# Patient Record
Sex: Female | Born: 1935
Health system: Southern US, Community
[De-identification: ages and names within clinical notes are randomized; demographics above are authoritative.]

## PROBLEM LIST (undated history)

## (undated) DIAGNOSIS — E079 Disorder of thyroid, unspecified: Secondary | ICD-10-CM

## (undated) HISTORY — PX: TOTAL HIP ARTHROPLASTY: SHX124

## (undated) HISTORY — PX: TONSILLECTOMY: SUR1361

## (undated) HISTORY — PX: ABDOMINAL HYSTERECTOMY: SHX81

---

## 2008-07-23 ENCOUNTER — Ambulatory Visit (HOSPITAL_COMMUNITY): Admission: RE | Admit: 2008-07-23 | Discharge: 2008-07-23 | Payer: Self-pay | Admitting: Internal Medicine

## 2008-07-24 ENCOUNTER — Encounter (INDEPENDENT_AMBULATORY_CARE_PROVIDER_SITE_OTHER): Payer: Self-pay | Admitting: *Deleted

## 2008-08-12 ENCOUNTER — Ambulatory Visit: Payer: Self-pay | Admitting: Gastroenterology

## 2008-08-12 DIAGNOSIS — R198 Other specified symptoms and signs involving the digestive system and abdomen: Secondary | ICD-10-CM | POA: Insufficient documentation

## 2008-08-12 DIAGNOSIS — K921 Melena: Secondary | ICD-10-CM | POA: Insufficient documentation

## 2008-08-12 DIAGNOSIS — K649 Unspecified hemorrhoids: Secondary | ICD-10-CM | POA: Insufficient documentation

## 2008-08-22 ENCOUNTER — Encounter: Payer: Self-pay | Admitting: Gastroenterology

## 2008-08-22 DIAGNOSIS — K625 Hemorrhage of anus and rectum: Secondary | ICD-10-CM | POA: Insufficient documentation

## 2008-09-04 ENCOUNTER — Encounter: Payer: Self-pay | Admitting: Gastroenterology

## 2008-09-04 ENCOUNTER — Telehealth (INDEPENDENT_AMBULATORY_CARE_PROVIDER_SITE_OTHER): Payer: Self-pay

## 2008-11-03 ENCOUNTER — Encounter (INDEPENDENT_AMBULATORY_CARE_PROVIDER_SITE_OTHER): Payer: Self-pay

## 2009-03-31 ENCOUNTER — Encounter: Payer: Self-pay | Admitting: Gastroenterology

## 2009-11-21 ENCOUNTER — Ambulatory Visit (HOSPITAL_COMMUNITY): Admission: RE | Admit: 2009-11-21 | Discharge: 2009-11-21 | Payer: Self-pay | Admitting: Internal Medicine

## 2010-01-26 ENCOUNTER — Ambulatory Visit: Payer: Self-pay | Admitting: Cardiology

## 2010-01-26 ENCOUNTER — Ambulatory Visit: Admission: RE | Admit: 2010-01-26 | Discharge: 2010-01-26 | Payer: Self-pay | Admitting: Orthopedic Surgery

## 2010-01-27 ENCOUNTER — Encounter (INDEPENDENT_AMBULATORY_CARE_PROVIDER_SITE_OTHER): Payer: Self-pay | Admitting: Orthopedic Surgery

## 2010-01-27 ENCOUNTER — Ambulatory Visit (HOSPITAL_COMMUNITY): Admission: RE | Admit: 2010-01-27 | Discharge: 2010-01-27 | Payer: Self-pay | Admitting: Orthopedic Surgery

## 2010-01-28 ENCOUNTER — Inpatient Hospital Stay (HOSPITAL_COMMUNITY): Admission: RE | Admit: 2010-01-28 | Discharge: 2010-02-01 | Payer: Medicare Other | Admitting: Orthopedic Surgery

## 2010-06-23 LAB — TYPE AND SCREEN: Antibody Screen: NEGATIVE

## 2010-06-23 LAB — BASIC METABOLIC PANEL
BUN: 21 mg/dL (ref 6–23)
CO2: 21 mEq/L (ref 19–32)
CO2: 25 mEq/L (ref 19–32)
Calcium: 7.8 mg/dL — ABNORMAL LOW (ref 8.4–10.5)
Chloride: 112 mEq/L (ref 96–112)
GFR calc Af Amer: 40 mL/min — ABNORMAL LOW (ref >60)
GFR calc Af Amer: 43 mL/min — ABNORMAL LOW (ref >60)
GFR calc non Af Amer: 36 mL/min — ABNORMAL LOW (ref >60)
GFR calc non Af Amer: 51 mL/min — ABNORMAL LOW (ref >60)
Glucose, Bld: 119 mg/dL — ABNORMAL HIGH (ref 70–99)
Potassium: 4.3 mEq/L (ref 3.5–5.1)
Potassium: 4.6 mEq/L (ref 3.5–5.1)
Sodium: 135 mEq/L (ref 135–145)
Sodium: 140 mEq/L (ref 135–145)

## 2010-06-23 LAB — CBC
HCT: 21.3 % — ABNORMAL LOW (ref 36.0–46.0)
HCT: 22.4 % — ABNORMAL LOW (ref 36.0–46.0)
HCT: 22.6 % — ABNORMAL LOW (ref 36.0–46.0)
HCT: 23.4 % — ABNORMAL LOW (ref 36.0–46.0)
Hemoglobin: 7.6 g/dL — ABNORMAL LOW (ref 12.0–15.0)
Hemoglobin: 7.8 g/dL — ABNORMAL LOW (ref 12.0–15.0)
Hemoglobin: 8.2 g/dL — ABNORMAL LOW (ref 12.0–15.0)
MCH: 33.3 pg (ref 26.0–34.0)
MCH: 33.7 pg (ref 26.0–34.0)
MCHC: 33.9 g/dL (ref 30.0–36.0)
MCHC: 34.1 g/dL (ref 30.0–36.0)
MCHC: 34.4 g/dL (ref 30.0–36.0)
MCV: 97.6 fL (ref 78.0–100.0)
MCV: 98.9 fL (ref 78.0–100.0)
Platelets: 180 10*3/uL (ref 150–400)
Platelets: 190 10*3/uL (ref 150–400)
RBC: 2.13 MIL/uL — ABNORMAL LOW (ref 3.87–5.11)
RBC: 2.26 MIL/uL — ABNORMAL LOW (ref 3.87–5.11)
RBC: 2.31 MIL/uL — ABNORMAL LOW (ref 3.87–5.11)
RDW: 14.5 % (ref 11.5–15.5)
RDW: 14.5 % (ref 11.5–15.5)
RDW: 14.6 % (ref 11.5–15.5)
WBC: 6.6 10*3/uL (ref 4.0–10.5)
WBC: 7 10*3/uL (ref 4.0–10.5)

## 2010-06-23 LAB — DIFFERENTIAL
Basophils Absolute: 0 10*3/uL (ref 0.0–0.1)
Basophils Relative: 0 % (ref 0–1)
Eosinophils Relative: 2 % (ref 0–5)
Monocytes Absolute: 0.7 10*3/uL (ref 0.1–1.0)
Neutro Abs: 5 10*3/uL (ref 1.7–7.7)

## 2010-06-24 LAB — SURGICAL PCR SCREEN
MRSA, PCR: NEGATIVE
Staphylococcus aureus: NEGATIVE

## 2010-06-24 LAB — CBC
HCT: 33.7 % — ABNORMAL LOW (ref 36.0–46.0)
Hemoglobin: 11.4 g/dL — ABNORMAL LOW (ref 12.0–15.0)
RDW: 14.2 % (ref 11.5–15.5)
WBC: 5.9 10*3/uL (ref 4.0–10.5)

## 2010-06-24 LAB — BASIC METABOLIC PANEL
Calcium: 9.8 mg/dL (ref 8.4–10.5)
GFR calc non Af Amer: 25 mL/min — ABNORMAL LOW (ref >60)
Potassium: 5.5 mEq/L — ABNORMAL HIGH (ref 3.5–5.1)
Sodium: 141 mEq/L (ref 135–145)

## 2010-06-24 LAB — PROTIME-INR: INR: 0.96 (ref 0.00–1.49)

## 2010-06-24 LAB — HEPATIC FUNCTION PANEL
ALT: 13 U/L (ref 0–35)
Bilirubin, Direct: 0.1 mg/dL (ref 0.0–0.3)
Indirect Bilirubin: 0.7 mg/dL (ref 0.3–0.9)
Total Protein: 7.3 g/dL (ref 6.0–8.3)

## 2010-06-24 LAB — URINALYSIS, ROUTINE W REFLEX MICROSCOPIC
Nitrite: NEGATIVE
Specific Gravity, Urine: 1.018 (ref 1.005–1.030)
pH: 5.5 (ref 5.0–8.0)

## 2010-06-24 LAB — DIFFERENTIAL
Basophils Absolute: 0 10*3/uL (ref 0.0–0.1)
Lymphocytes Relative: 19 % (ref 12–46)
Monocytes Absolute: 0.5 10*3/uL (ref 0.1–1.0)
Neutro Abs: 4.1 10*3/uL (ref 1.7–7.7)
Neutrophils Relative %: 70 % (ref 43–77)

## 2010-06-24 LAB — APTT: aPTT: 29 seconds (ref 24–37)

## 2010-07-07 ENCOUNTER — Other Ambulatory Visit (HOSPITAL_COMMUNITY): Payer: Self-pay | Admitting: Internal Medicine

## 2010-07-07 ENCOUNTER — Ambulatory Visit (HOSPITAL_COMMUNITY)
Admission: RE | Admit: 2010-07-07 | Discharge: 2010-07-07 | Disposition: A | Discharge status: Home / Self Care | Payer: Medicare Other | Source: Ambulatory Visit | Attending: Internal Medicine | Admitting: Internal Medicine

## 2010-07-07 DIAGNOSIS — R52 Pain, unspecified: Secondary | ICD-10-CM

## 2010-07-07 DIAGNOSIS — M546 Pain in thoracic spine: Secondary | ICD-10-CM | POA: Insufficient documentation

## 2010-11-19 ENCOUNTER — Emergency Department (HOSPITAL_COMMUNITY): Payer: Medicare Other

## 2010-11-19 ENCOUNTER — Encounter: Payer: Self-pay | Admitting: *Deleted

## 2010-11-19 ENCOUNTER — Emergency Department (HOSPITAL_COMMUNITY)
Admission: EM | Admit: 2010-11-19 | Discharge: 2010-11-19 | Disposition: A | Discharge status: Home / Self Care | Payer: Medicare Other | Attending: Emergency Medicine | Admitting: Emergency Medicine

## 2010-11-19 DIAGNOSIS — S0181XA Laceration without foreign body of other part of head, initial encounter: Secondary | ICD-10-CM

## 2010-11-19 DIAGNOSIS — R296 Repeated falls: Secondary | ICD-10-CM | POA: Insufficient documentation

## 2010-11-19 DIAGNOSIS — S01501A Unspecified open wound of lip, initial encounter: Secondary | ICD-10-CM | POA: Insufficient documentation

## 2010-11-19 DIAGNOSIS — S1093XA Contusion of unspecified part of neck, initial encounter: Secondary | ICD-10-CM | POA: Insufficient documentation

## 2010-11-19 DIAGNOSIS — W010XXA Fall on same level from slipping, tripping and stumbling without subsequent striking against object, initial encounter: Secondary | ICD-10-CM

## 2010-11-19 DIAGNOSIS — Y92009 Unspecified place in unspecified non-institutional (private) residence as the place of occurrence of the external cause: Secondary | ICD-10-CM | POA: Insufficient documentation

## 2010-11-19 DIAGNOSIS — S022XXA Fracture of nasal bones, initial encounter for closed fracture: Secondary | ICD-10-CM

## 2010-11-19 DIAGNOSIS — IMO0002 Reserved for concepts with insufficient information to code with codable children: Secondary | ICD-10-CM | POA: Insufficient documentation

## 2010-11-19 DIAGNOSIS — S0083XA Contusion of other part of head, initial encounter: Secondary | ICD-10-CM

## 2010-11-19 DIAGNOSIS — S60229A Contusion of unspecified hand, initial encounter: Secondary | ICD-10-CM

## 2010-11-19 DIAGNOSIS — S6000XA Contusion of unspecified finger without damage to nail, initial encounter: Secondary | ICD-10-CM | POA: Insufficient documentation

## 2010-11-19 DIAGNOSIS — S0003XA Contusion of scalp, initial encounter: Secondary | ICD-10-CM | POA: Insufficient documentation

## 2010-11-19 DIAGNOSIS — S0081XA Abrasion of other part of head, initial encounter: Secondary | ICD-10-CM

## 2010-11-19 HISTORY — DX: Disorder of thyroid, unspecified: E07.9

## 2010-11-19 MED ORDER — HYDROCODONE-ACETAMINOPHEN 5-325 MG PO TABS: 1.0000 | ORAL_TABLET | ORAL | Status: AC | PRN

## 2010-11-19 MED ORDER — ACETAMINOPHEN 325 MG PO TABS
650.0000 mg | ORAL_TABLET | Freq: Once | ORAL | Status: AC
  Administered 2010-11-19: 650 mg via ORAL
  Filled 2010-11-19: qty 2

## 2010-11-19 MED ORDER — IBUPROFEN 400 MG PO TABS: 400.0000 mg | ORAL_TABLET | Freq: Three times a day (TID) | ORAL | Status: AC | PRN

## 2010-11-19 NOTE — ED Notes (Signed)
Bruising noted to face, nose, top of head, and left hand.

## 2010-11-19 NOTE — ED Notes (Signed)
Family at bedside. 

## 2010-11-19 NOTE — ED Notes (Signed)
Pt states that she lost her balance while looking behind her this am and fell in the bathroom. Pt states that she hit her head on the tile floor. Denies loss of consciousness. Pt has laceration to her nose, and just above upper lip. Pt also has abrasions to her left elbow left side of head with hematoma and  left little finger. Pt also c/o pain in her head left little finger, nose and bilateral knees. Small amt bright red blood noted from rt nostril.

## 2010-11-19 NOTE — ED Provider Notes (Signed)
History    Scribed for Felisa Bonier, MD, the patient was seen in room APA17/APA17 . This chart was scribed by Desma Paganini. This patient's care was started at 10:52 AM .   CSN: [id number] Arrival date & time: 11/19/2010 10:42 AM  Chief Complaint  Patient presents with  . Fall   HPI Kimberly Velazquez is a 77 y.o. female who presents to the Emergency Department complaining of fall just prior to arrival. She describes that she was at home in the bathroom when she felt that there was some toilet paper stuck to her behind. When she attempted to lean over and view herself in the mirror to remove the paper, she experienced a mechanical slip and fall forward on to her face on the linoleum floor. She was able to get herself to a phone and called her son to help her get to the ER. She has abrasions over the left side of her forehead, nose and upper lip. She denies LOC, current HA, nausea, vomiting, and has no new neck pain or back pain.   PAST MEDICAL HISTORY:  Past Medical History  Diagnosis Date  . Thyroid disease      PAST SURGICAL HISTORY:  Past Surgical History  Procedure Date  . Total hip arthroplasty     left  . Abdominal hysterectomy   . Tonsillectomy      MEDICATIONS:  Previous Medications   No medications on file     ALLERGIES:  Allergies as of 11/19/2010 - Review Complete 11/19/2010  Allergen Reaction Noted  . Keflex  11/19/2010  . Penicillins  11/19/2010     FAMILY HISTORY:  History reviewed. No pertinent family history.   SOCIAL HISTORY: History   Social History  . Marital Status: Widowed    Spouse Name: N/A    Number of Children: N/A  . Years of Education: N/A   Social History Main Topics  . Smoking status: Never Smoker   . Smokeless tobacco: None  . Alcohol Use: No  . Drug Use: No  . Sexually Active:    Other Topics Concern  . None   Social History Narrative  . None     Review of Systems 10 Systems reviewed and are negative for acute change  except as noted in the HPI.  Physical Exam  BP 148/66  Pulse 80  Temp(Src) 97.9 F (36.6 C) (Oral)  Resp 20  Ht 5\' 1"  (1.549 m)  Wt 116 lb (52.617 kg)  BMI 21.92 kg/m2  SpO2 99%  Physical Exam  Constitutional: She is oriented to person, place, and time. She appears well-developed and well-nourished. No distress.  HENT:  Head: Head is with contusion (Scalp hematoma at the left parietal / occipital scalp with some swelling).  Right Ear: Tympanic membrane normal. No hemotympanum.  Left Ear: Tympanic membrane normal. No hemotympanum.  Nose: Sinus tenderness (Small amount of blood in left nare without active bleeding and no nasal septal hematoma. Some swelling of the bridge of the nose without ecchymosis) present.  Eyes: Pupils are equal, round, and reactive to light.  Neck: Normal range of motion. Neck supple. No tracheal deviation present.  Cardiovascular: Normal rate, regular rhythm and normal heart sounds.   Pulmonary/Chest: Effort normal and breath sounds normal. No respiratory distress.  Abdominal: Soft. Bowel sounds are normal. She exhibits no distension. There is no tenderness.  Musculoskeletal: Normal range of motion. She exhibits no edema and no tenderness.       Contusion and abrasion radial  aspect of left elbow, left little finger there is welling and ecchymosis about the proximal interphalangeal joint, bruising on dorsum of righth and over metacarpal phalangaeal of the fourth and fifth finger  Pelvis is stable without tenderness to palpation  Neurological: She is alert and oriented to person, place, and time. Coordination normal.  Skin: Skin is warm and dry.  Psychiatric: She has a normal mood and affect. Her behavior is normal.    ED Course  Procedures  OTHER DATA REVIEWED: Nursing notes, vital signs, and past medical records reviewed.    DIAGNOSTIC STUDIES: Oxygen Saturation is 99% on room air, normal by my interpretation.     LABS / RADIOLOGY: CT scan of the  head and maxillofacial structures was reviewed by me as well as by the reading radiologist, and demonstrates no acute intracranial injury, cranial injury, that does show bilateral minimally displaced nasal bone fractures. X-rays of the bilateral hands were obtained due to the presence of tenderness and bruising, and these radiographs likewise showed no acute fracture    ED COURSE / COORDINATION OF CARE: 11:40. Completed initial assessment. Placed orders for laceration repair and pain control.    MDM: Differential Diagnosis: closed head injury, facial fracture, contusion, laceration, abrasion, hand fractures.   IMPRESSION: Facial contusions, facial abrasions, small facial laceration to the upper lip, minor head injury, bilateral nasal bone fractures, contusions of the upper extremities.    PLAN:  Home Nonsteroidals and Narcotic pain medication The patient is to return the emergency department if there is any worsening of symptoms. I have reviewed the discharge instructions with the patient and her family and they state their understanding of and agreement with the plan of care.   CONDITION ON DISCHARGE: Stable   MEDICATIONS GIVEN IN THE E.D. Medications - No data to display   DISCHARGE MEDICATIONS: New Prescriptions   No medications on file    Scribe Attestation I personally performed the services described in this documentation, which was scribed in my presence. The recorded information has been reviewed and considered.    Felisa Bonier, MD 11/19/10 [phone]

## 2011-10-22 IMAGING — CR DG HAND COMPLETE 3+V*R*
3 series · 3 of 3 positions shown · non-contrast
Comparison: None.

CLINICAL DATA: Swelling, pain, bruising

RIGHT HAND - COMPLETE 3+ VIEW

[view not recorded (1 of 3)]
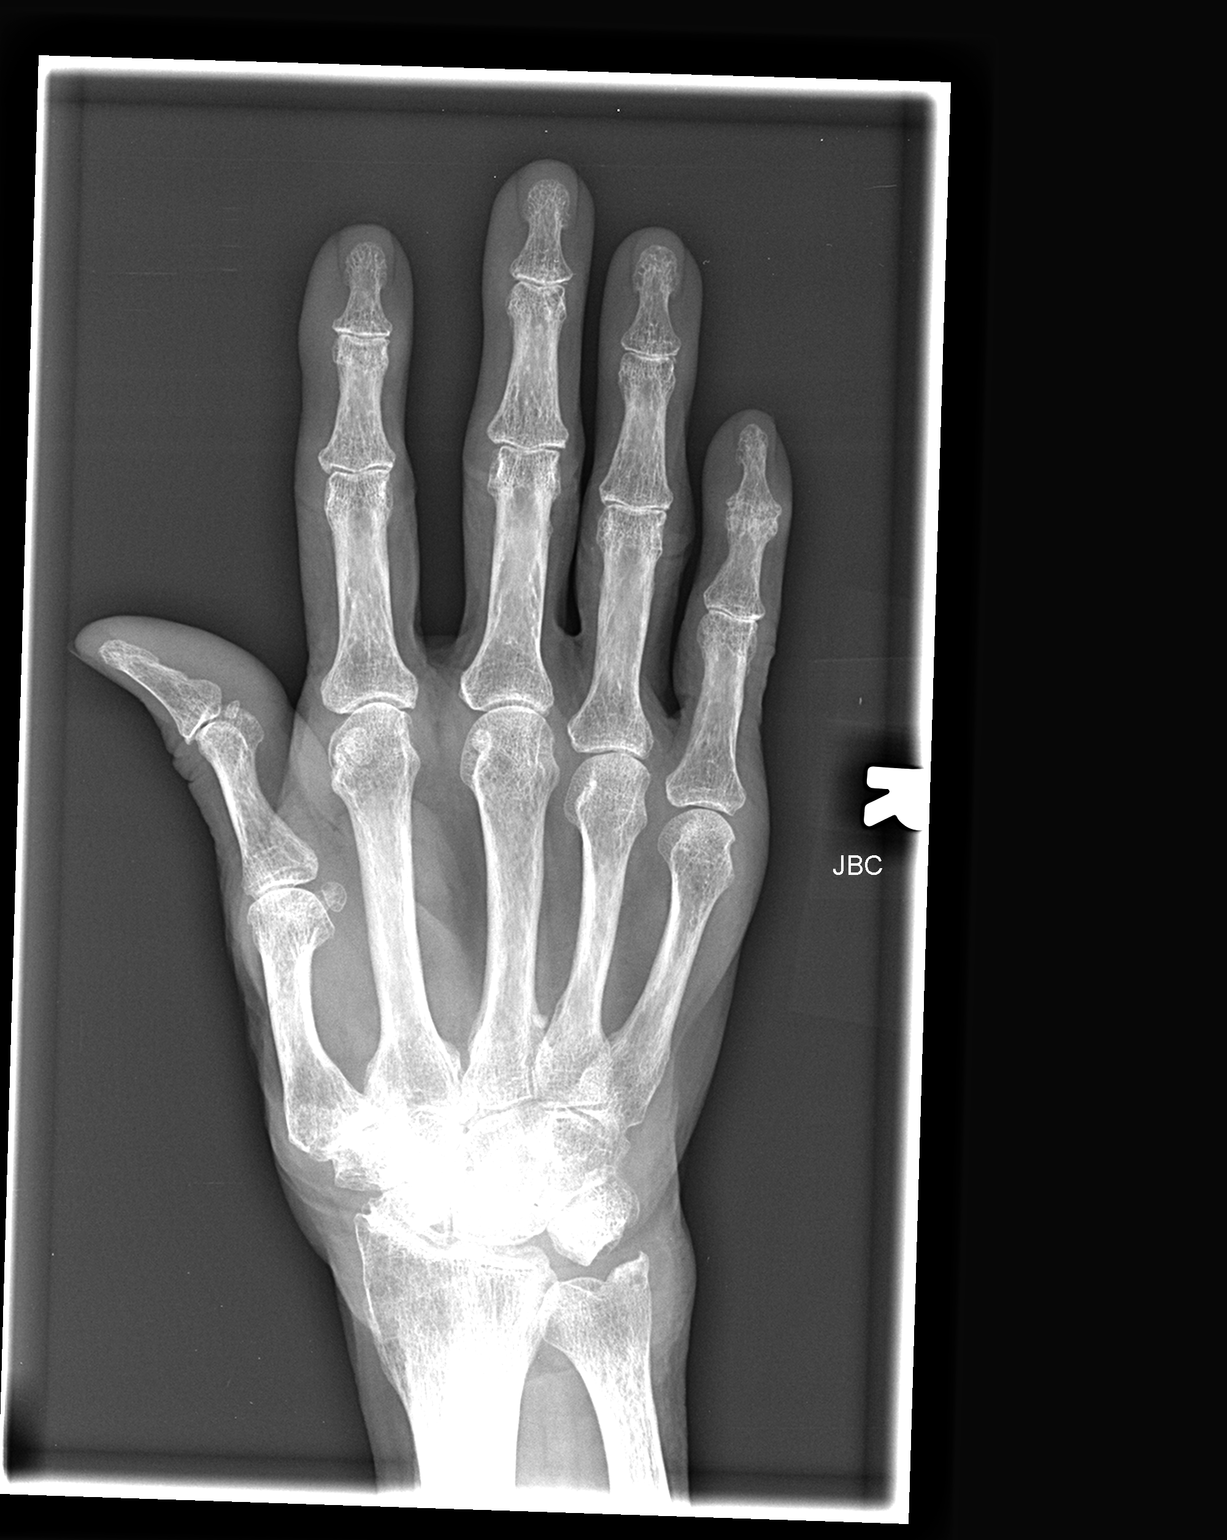

[view not recorded (2 of 3)]
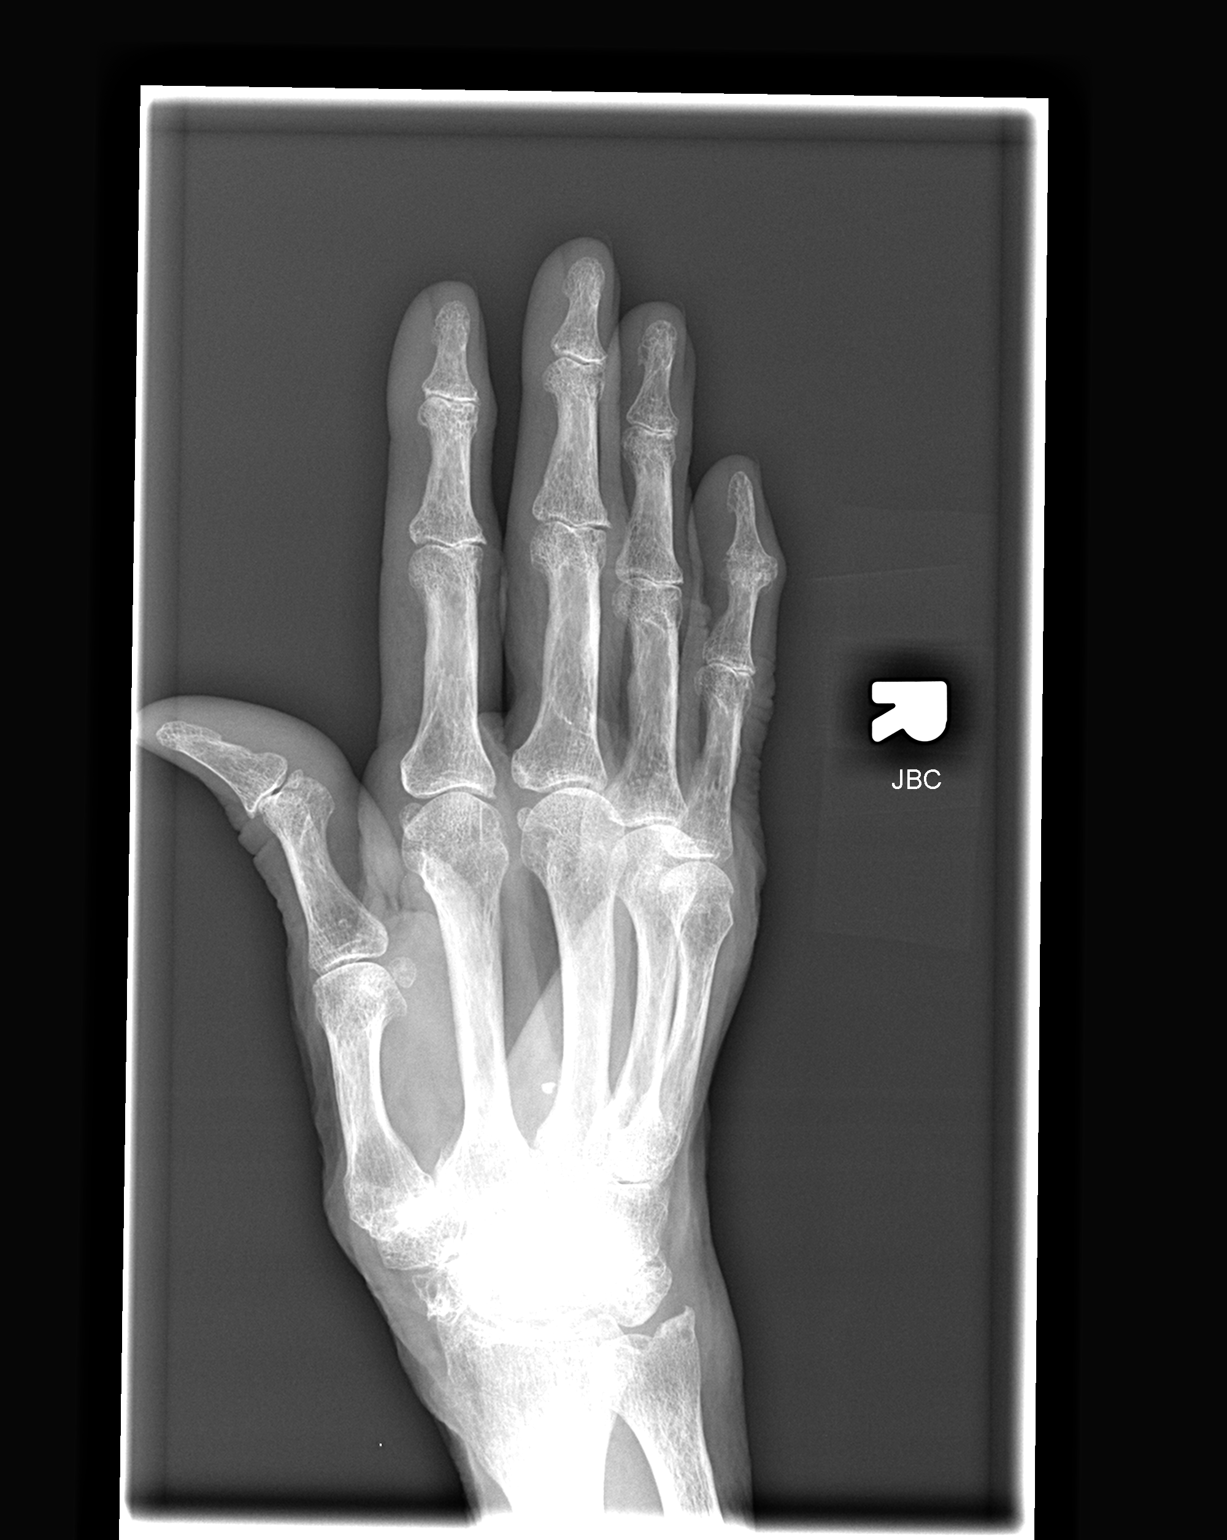

[view not recorded (3 of 3)]
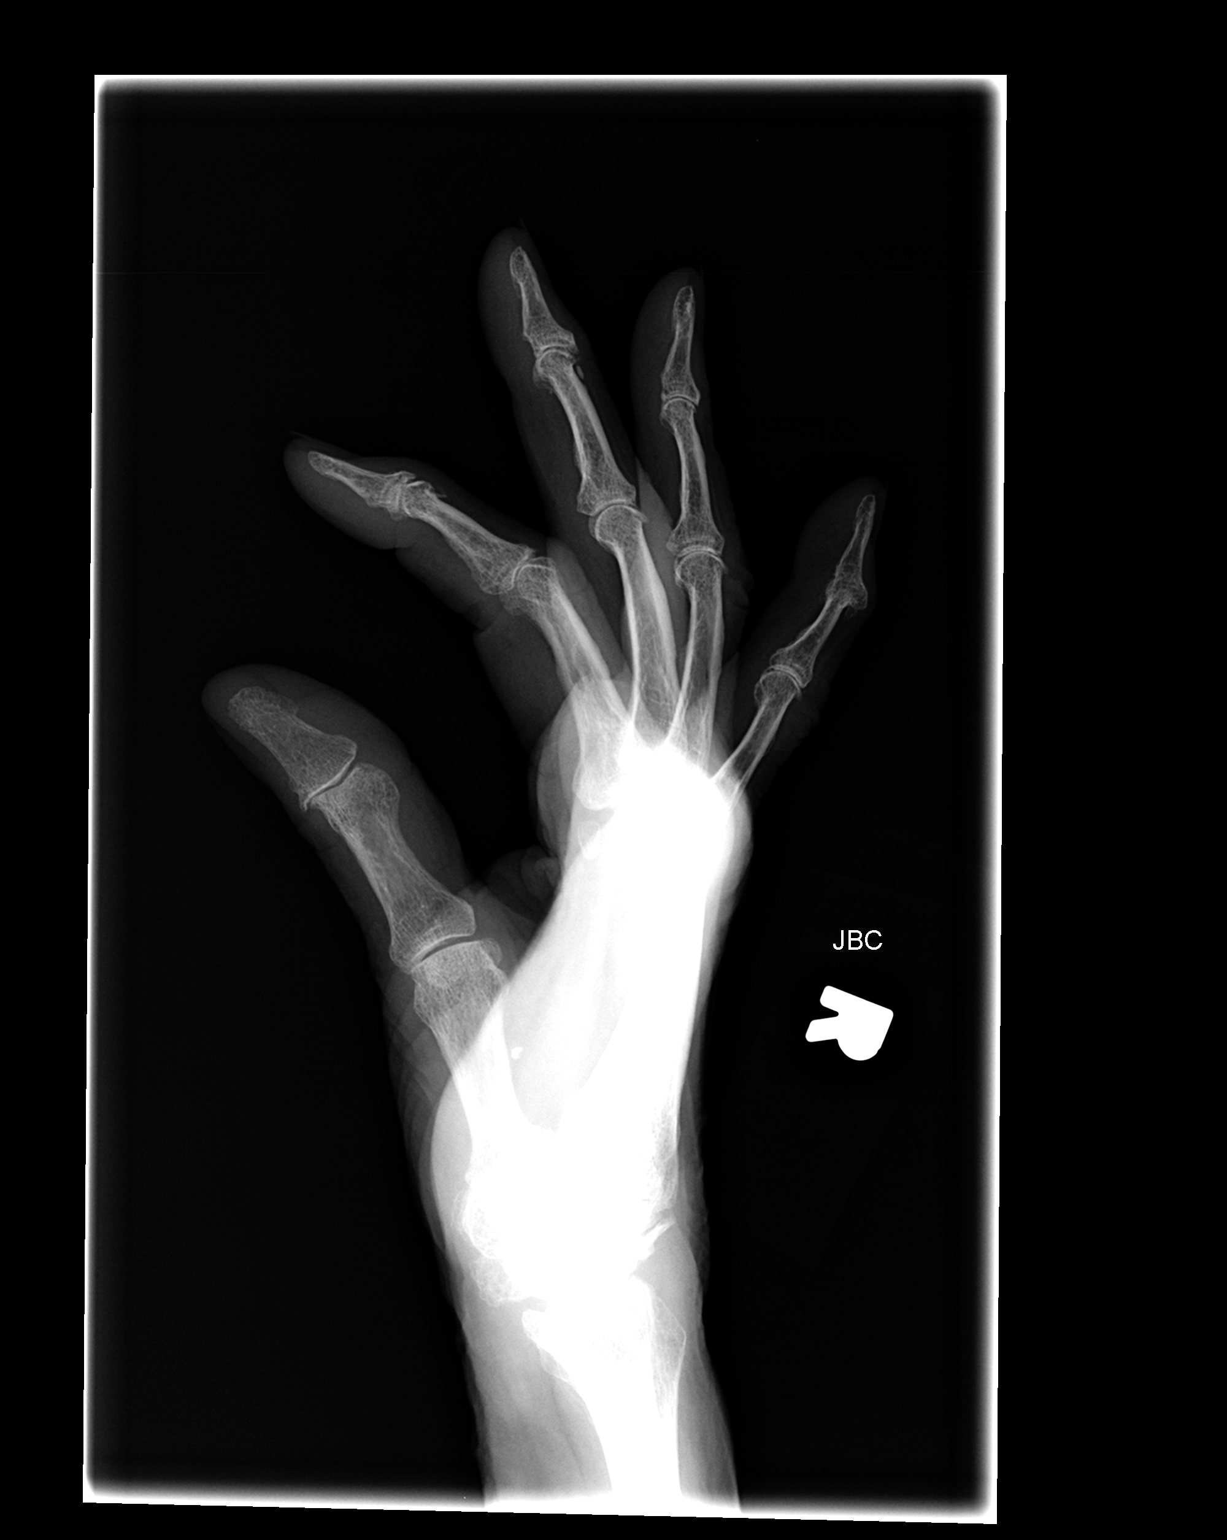

[3 of 3 positions shown; findings below may reference images not displayed]

FINDINGS: Diffuse degenerative changes of the carpal bones and the
radiocarpal joint.  Bones are osteopenic.  Diffuse osteoarthritis
of the PIP and DIP joints also.  No acute fracture or malalignment.
IMPRESSION: Diffuse degenerative arthritis of the wrist and hand.

No acute osseous finding or fracture.

## 2014-07-25 ENCOUNTER — Ambulatory Visit: Payer: Self-pay | Admitting: Cardiovascular Disease

## 2014-07-25 ENCOUNTER — Ambulatory Visit (INDEPENDENT_AMBULATORY_CARE_PROVIDER_SITE_OTHER): Payer: Medicare HMO | Admitting: Cardiovascular Disease

## 2014-07-25 ENCOUNTER — Encounter: Payer: Self-pay | Admitting: Cardiovascular Disease

## 2014-07-25 VITALS — BP 122/68 | HR 74 | Ht 61.0 in | Wt 131.0 lb

## 2014-07-25 DIAGNOSIS — R002 Palpitations: Secondary | ICD-10-CM | POA: Diagnosis not present

## 2014-07-25 DIAGNOSIS — Z136 Encounter for screening for cardiovascular disorders: Secondary | ICD-10-CM | POA: Diagnosis not present

## 2014-07-25 DIAGNOSIS — K625 Hemorrhage of anus and rectum: Secondary | ICD-10-CM

## 2014-07-25 NOTE — Progress Notes (Signed)
Patient ID: Kimberly Velazquez, female   DOB: 19-Oct-1919, 81 y.o.   MRN: [id number]       CARDIOLOGY CONSULT NOTE  Patient ID: Kimberly Velazquez MRN: [id number] DOB/AGE: November 02, 1919 81 y.o.  Admit date: (Not on file) Primary Physician Catalina Pizza, MD  Reason for Consultation: palpitations.  HPI: The patient is a 81 year old woman who has been referred by Dr. Margo Aye for palpitations. She is Dr. Scharlene Gloss wife's grandmother, and she is accompanied by her son, Dr. Scharlene Gloss father-in-law, [name].  For the past 4-6 weeks, she has been experiencing weak spells associated with palpitations. She notices them more frequently when she is standing or walking around but never while sitting. In fact, sitting down alleviates her symptoms. She denies chest pain, jaw pain, and neck pain related to these episodes. She does have a separate neck pain when raising her head.  She denies shortness of breath and syncope. She does have episodes of lower back pain and midthoracic back pain which sometimes precede these episodes of palpitations. She denies syncope. She has a history of bilateral venous varicosities and underwent vein stripping in her left leg approximately 15 years ago. She also has nocturia about every 2 hours.  ECG performed in the office today demonstrates normal sinus rhythm with an isolated PVC and rSR' pattern in leads V1 and V2. There are no ischemic ST segment or T-wave abnormalities.  Labs from 06/06/14 were personally reviewed and demonstrated BUN 53, creatinine 1.59, potassium 4.6, hemoglobin 12.7, total cholesterol 273, LDL 156, HDL 97, triglycerides 101, TSH 1.4, HbA1c 5.8%.  Soc: She is Dr. Scharlene Gloss wife's grandmother.     Allergies  Allergen Reactions  . Cephalexin Other (See Comments)    Pt. Doesn't remember.   . Penicillins Other (See Comments)    Pt. Cant remember.     Current Outpatient Prescriptions  Medication Sig Dispense Refill  . Acetaminophen (TYLENOL EXTRA STRENGTH PO) Take by  mouth 2 (two) times daily with breakfast and lunch.    Marland Kitchen aspirin 81 MG tablet Take 81 mg by mouth daily.    . calcium carbonate (OS-CAL) 1250 MG chewable tablet Chew 1 tablet by mouth 2 (two) times daily.     . Calcium-Vitamin D-Vitamin K (VIACTIV PO) Take by mouth 2 (two) times daily.    . Ginkgo Biloba 120 MG CAPS Take 120 mg by mouth daily with breakfast.    . levothyroxine (SYNTHROID, LEVOTHROID) 75 MCG tablet Take 75 mcg by mouth daily.      . Multiple Vitamins-Minerals (ONE-A-DAY WOMENS 50 PLUS PO) Take 1 tablet by mouth daily.       No current facility-administered medications for this visit.    Past Medical History  Diagnosis Date  . Thyroid disease     Past Surgical History  Procedure Laterality Date  . Total hip arthroplasty      left  . Abdominal hysterectomy    . Tonsillectomy      History   Social History  . Marital Status: Widowed    Spouse Name: N/A  . Number of Children: N/A  . Years of Education: N/A   Occupational History  . Not on file.   Social History Main Topics  . Smoking status: Never Smoker   . Smokeless tobacco: Not on file  . Alcohol Use: No  . Drug Use: No  . Sexual Activity: Not on file   Other Topics Concern  . Not on file   Social History Narrative  No family history of premature CAD in 1st degree relatives.  Prior to Admission medications   Medication Sig Start Date End Date Taking? Authorizing Provider  Acetaminophen (TYLENOL EXTRA STRENGTH PO) Take by mouth 2 (two) times daily with breakfast and lunch.   Yes Historical Provider, MD  aspirin 81 MG tablet Take 81 mg by mouth daily.   Yes Historical Provider, MD  calcium carbonate (OS-CAL) 1250 MG chewable tablet Chew 1 tablet by mouth 2 (two) times daily.    Yes Historical Provider, MD  Calcium-Vitamin D-Vitamin K (VIACTIV PO) Take by mouth 2 (two) times daily.   Yes Historical Provider, MD  Ginkgo Biloba 120 MG CAPS Take 120 mg by mouth daily with breakfast.   Yes Historical  Provider, MD  levothyroxine (SYNTHROID, LEVOTHROID) 75 MCG tablet Take 75 mcg by mouth daily.     Yes Historical Provider, MD  Multiple Vitamins-Minerals (ONE-A-DAY WOMENS 50 PLUS PO) Take 1 tablet by mouth daily.     Yes Historical Provider, MD     Review of systems complete and found to be negative unless listed above in HPI     Physical exam Blood pressure 122/68, pulse 74, height 5\' 1"  (1.549 m), weight 131 lb (59.421 kg). General: NAD Neck: No JVD, no thyromegaly or thyroid nodule.  Lungs: Clear to auscultation bilaterally with normal respiratory effort. CV: Nondisplaced PMI. Regular rate and rhythm, normal S1/S2, no S3/S4, no murmur.  No peripheral edema.  No carotid bruit.  Normal pedal pulses.  Abdomen: Soft, nontender, no hepatosplenomegaly, no distention.  Skin: Intact without lesions or rashes.  Neurologic: Alert and oriented x 3.  Psych: Normal affect. Extremities: No clubbing or cyanosis.  HEENT: Normal.   ECG: Most recent ECG reviewed.  Labs:   Lab Results  Component Value Date   WBC 6.6 02/01/2010   HGB 7.1* 02/01/2010   HCT 21.0* 02/01/2010   MCV 98.9 02/01/2010   PLT 180 02/01/2010   No results for input(s): NA, K, CL, CO2, BUN, CREATININE, CALCIUM, PROT, BILITOT, ALKPHOS, ALT, AST, GLUCOSE in the last 168 hours.  Invalid input(s): LABALBU No results found for: CKTOTAL, CKMB, CKMBINDEX, TROPONINI No results found for: CHOL No results found for: HDL No results found for: LDLCALC No results found for: TRIG No results found for: CHOLHDL No results found for: LDLDIRECT       Studies: No results found.  ASSESSMENT AND PLAN:  1. Palpitations: These appear to be posturally related to some degree. She has an isolated PVC on ECG. I will obtain an echocardiogram to evaluate for structural heart disease, and a one-week event monitor to evaluate for tachyarrhythmias. She and her son are in agreement with this plan.  Dispo: f/u 1 month.   Signed: Prentice DockerSuresh  Jinny Sweetland, M.D., F.A.C.C.  07/25/2014, 11:26 AM

## 2014-07-25 NOTE — Patient Instructions (Signed)
Your physician recommends that you schedule a follow-up appointment in: 1 month  Your physician recommends that you continue on your current medications as directed. Please refer to the Current Medication list given to you today.  Your physician has requested that you have an echocardiogram. Echocardiography is a painless test that uses sound waves to create images of your heart. It provides your doctor with information about the size and shape of your heart and how well your heart's chambers and valves are working. This procedure takes approximately one hour. There are no restrictions for this procedure.  Your physician has recommended that you wear an event monitor. Event monitors are medical devices that record the heart's electrical activity. Doctors most often us these monitors to diagnose arrhythmias. Arrhythmias are problems with the speed or rhythm of the heartbeat. The monitor is a small, portable device. You can wear one while you do your normal daily activities. This is usually used to diagnose what is causing palpitations/syncope (passing out).  Thank you for choosing [facility]!

## 2014-08-05 ENCOUNTER — Other Ambulatory Visit (HOSPITAL_COMMUNITY): Payer: Medicare HMO

## 2014-08-06 ENCOUNTER — Telehealth: Payer: Self-pay | Admitting: *Deleted

## 2014-08-06 NOTE — Telephone Encounter (Signed)
EOS report placed on Dr. Purvis SheffieldKoneswaran desk

## 2014-08-08 ENCOUNTER — Ambulatory Visit (HOSPITAL_COMMUNITY)
Admission: RE | Admit: 2014-08-08 | Discharge: 2014-08-08 | Disposition: A | Discharge status: Home / Self Care | Payer: Medicare HMO | Source: Ambulatory Visit | Attending: Cardiovascular Disease | Admitting: Cardiovascular Disease

## 2014-08-08 DIAGNOSIS — R002 Palpitations: Secondary | ICD-10-CM

## 2014-08-08 NOTE — Progress Notes (Signed)
  Echocardiogram 2D Echocardiogram has been performed.  Stacey DrainWhite, Feleica Fulmore J 08/08/2014, 12:40 PM

## 2014-09-02 ENCOUNTER — Encounter: Payer: Self-pay | Admitting: Cardiovascular Disease

## 2014-09-02 ENCOUNTER — Ambulatory Visit (INDEPENDENT_AMBULATORY_CARE_PROVIDER_SITE_OTHER): Payer: Medicare HMO | Admitting: Cardiovascular Disease

## 2014-09-02 VITALS — BP 128/64 | HR 85 | Ht 61.0 in | Wt 132.0 lb

## 2014-09-02 DIAGNOSIS — R002 Palpitations: Secondary | ICD-10-CM

## 2014-09-02 MED ORDER — METOPROLOL TARTRATE 25 MG PO TABS: ORAL_TABLET | ORAL | Status: DC

## 2014-09-02 NOTE — Patient Instructions (Signed)
Your physician recommends that you schedule a follow-up appointment in: only as needed    Take Metoprolol 12.5 mg daily as needed for palpitations     Thank you for choosing [facility] !

## 2014-09-02 NOTE — Progress Notes (Signed)
Patient ID: Kimberly Velazquez, female   DOB: 1919/09/02, 81 y.o.   MRN: [id number]      SUBJECTIVE: Kimberly Velazquez presents for follow up after undergoing cardiovascular testing performed for the evaluation of palpitations. She is Dr. Scharlene Gloss wife's grandmother, and she is accompanied by her son, Dr. Scharlene Gloss father-in-law, Kimberly Velazquez.  Event monitoring demonstrated normal sinus rhythm and sinus tachycardia and isolated premature atrial contractions, with the fastest heart rate being 103 bpm. No symptoms were reported.  Echocardiogram demonstrated normal left ventricular systolic function, EF 55-60%, mild LVH, normal regional wall motion, grade 1 diastolic dysfunction, and no significant valvular regurgitation.  Both her son and the patient endorse that she did not have any significant "spells" while she wore the event monitor. She primarily experiences symptoms with activity and may last anywhere from one to 3 minutes.   Soc: She is Dr. Scharlene Gloss wife's grandmother.    Review of Systems: As per "subjective", otherwise negative.  Allergies  Allergen Reactions  . Cephalexin Other (See Comments)    Pt. Doesn't remember.   . Penicillins Other (See Comments)    Pt. Cant remember.     Current Outpatient Prescriptions  Medication Sig Dispense Refill  . Acetaminophen (TYLENOL EXTRA STRENGTH PO) Take by mouth 2 (two) times daily with breakfast and lunch.    Marland Kitchen aspirin 81 MG tablet Take 81 mg by mouth daily.    . calcium carbonate (OS-CAL) 1250 MG chewable tablet Chew 1 tablet by mouth 2 (two) times daily.     . Calcium-Vitamin D-Vitamin K (VIACTIV PO) Take by mouth 2 (two) times daily.    . Ginkgo Biloba 120 MG CAPS Take 120 mg by mouth daily with breakfast.    . levothyroxine (SYNTHROID, LEVOTHROID) 75 MCG tablet Take 75 mcg by mouth daily.      . Multiple Vitamins-Minerals (ONE-A-DAY WOMENS 50 PLUS PO) Take 1 tablet by mouth daily.       No current facility-administered medications for this visit.      Past Medical History  Diagnosis Date  . Thyroid disease     Past Surgical History  Procedure Laterality Date  . Total hip arthroplasty      left  . Abdominal hysterectomy    . Tonsillectomy      History   Social History  . Marital Status: Widowed    Spouse Name: N/A  . Number of Children: N/A  . Years of Education: N/A   Occupational History  . Not on file.   Social History Main Topics  . Smoking status: Never Smoker   . Smokeless tobacco: Not on file  . Alcohol Use: No  . Drug Use: No  . Sexual Activity: Not on file   Other Topics Concern  . Not on file   Social History Narrative     Filed Vitals:   09/02/14 1501  BP: 128/64  Pulse: 85  Height:  (1.549 m)  Weight: 132 lb (59.875 kg)  SpO2: 99%    PHYSICAL EXAM General: NAD Neck: No JVD, no thyromegaly or thyroid nodule.  Lungs: Clear to auscultation bilaterally with normal respiratory effort. CV: Nondisplaced PMI. Regular rate and rhythm with occasional premature contractions appreciated, normal S1/S2, no S3/S4, no murmur. No peripheral edema.   Abdomen: Soft, nontender, no distention.  Skin: Intact without lesions or rashes.  Neurologic: Alert and oriented.  Psych: Normal affect. Extremities: No clubbing or cyanosis.  HEENT: Normal.   ECG: Most recent ECG reviewed.  ASSESSMENT AND PLAN: 1. Palpitations: These appear to be both exertionally and posturally related to some degree. She previously had an isolated PVC on ECG and had an isolated PAC with event monitoring. Echocardiogram and event monitor results noted above. Will prescribe metoprolol 12.5 mg prn for palpitations which last several minutes and are bothersome. No additional cardiac testing is warranted.  Dispo: f/u 1 year.   Prentice DockerSuresh Felix Meras, M.D., F.A.C.C.

## 2014-11-03 ENCOUNTER — Ambulatory Visit (HOSPITAL_COMMUNITY)
Admission: RE | Admit: 2014-11-03 | Discharge: 2014-11-03 | Disposition: A | Discharge status: Home / Self Care | Payer: Medicare HMO | Source: Ambulatory Visit | Attending: Internal Medicine | Admitting: Internal Medicine

## 2014-11-03 ENCOUNTER — Other Ambulatory Visit (HOSPITAL_COMMUNITY): Payer: Self-pay | Admitting: Internal Medicine

## 2014-11-03 DIAGNOSIS — M533 Sacrococcygeal disorders, not elsewhere classified: Secondary | ICD-10-CM | POA: Diagnosis not present

## 2014-11-03 DIAGNOSIS — M25551 Pain in right hip: Secondary | ICD-10-CM | POA: Diagnosis not present

## 2014-11-03 DIAGNOSIS — M545 Low back pain: Secondary | ICD-10-CM | POA: Diagnosis present

## 2014-11-03 DIAGNOSIS — M5136 Other intervertebral disc degeneration, lumbar region: Secondary | ICD-10-CM | POA: Diagnosis not present

## 2014-11-03 DIAGNOSIS — W19XXXA Unspecified fall, initial encounter: Secondary | ICD-10-CM | POA: Insufficient documentation

## 2014-11-03 DIAGNOSIS — Z96642 Presence of left artificial hip joint: Secondary | ICD-10-CM | POA: Diagnosis not present

## 2014-11-08 ENCOUNTER — Observation Stay (HOSPITAL_COMMUNITY)
Admission: RE | Admit: 2014-11-08 | Discharge: 2014-11-10 | Disposition: A | Discharge status: Home / Self Care | Payer: Medicare HMO | Source: Ambulatory Visit | Attending: Internal Medicine | Admitting: Internal Medicine

## 2014-11-08 DIAGNOSIS — E039 Hypothyroidism, unspecified: Secondary | ICD-10-CM

## 2014-11-08 DIAGNOSIS — S8011XA Contusion of right lower leg, initial encounter: Secondary | ICD-10-CM | POA: Insufficient documentation

## 2014-11-08 DIAGNOSIS — Z880 Allergy status to penicillin: Secondary | ICD-10-CM | POA: Insufficient documentation

## 2014-11-08 DIAGNOSIS — S3992XA Unspecified injury of lower back, initial encounter: Secondary | ICD-10-CM | POA: Diagnosis not present

## 2014-11-08 DIAGNOSIS — W19XXXA Unspecified fall, initial encounter: Secondary | ICD-10-CM

## 2014-11-08 DIAGNOSIS — E038 Other specified hypothyroidism: Secondary | ICD-10-CM | POA: Diagnosis not present

## 2014-11-08 DIAGNOSIS — W1839XA Other fall on same level, initial encounter: Secondary | ICD-10-CM | POA: Insufficient documentation

## 2014-11-08 DIAGNOSIS — N182 Chronic kidney disease, stage 2 (mild): Secondary | ICD-10-CM | POA: Diagnosis not present

## 2014-11-08 DIAGNOSIS — S7002XA Contusion of left hip, initial encounter: Secondary | ICD-10-CM | POA: Diagnosis not present

## 2014-11-08 DIAGNOSIS — Y9389 Activity, other specified: Secondary | ICD-10-CM | POA: Diagnosis not present

## 2014-11-08 DIAGNOSIS — Z7982 Long term (current) use of aspirin: Secondary | ICD-10-CM | POA: Insufficient documentation

## 2014-11-08 DIAGNOSIS — M5442 Lumbago with sciatica, left side: Secondary | ICD-10-CM

## 2014-11-08 DIAGNOSIS — M5441 Lumbago with sciatica, right side: Secondary | ICD-10-CM | POA: Diagnosis not present

## 2014-11-08 DIAGNOSIS — Z96642 Presence of left artificial hip joint: Secondary | ICD-10-CM | POA: Diagnosis not present

## 2014-11-08 DIAGNOSIS — Z79899 Other long term (current) drug therapy: Secondary | ICD-10-CM | POA: Insufficient documentation

## 2014-11-08 DIAGNOSIS — W19XXXD Unspecified fall, subsequent encounter: Secondary | ICD-10-CM | POA: Diagnosis not present

## 2014-11-08 DIAGNOSIS — Y998 Other external cause status: Secondary | ICD-10-CM | POA: Diagnosis not present

## 2014-11-08 DIAGNOSIS — Y92002 Bathroom of unspecified non-institutional (private) residence single-family (private) house as the place of occurrence of the external cause: Secondary | ICD-10-CM | POA: Insufficient documentation

## 2014-11-08 DIAGNOSIS — M545 Low back pain, unspecified: Secondary | ICD-10-CM

## 2014-11-08 LAB — CBC
HEMATOCRIT: 37.9 % (ref 36.0–46.0)
Hemoglobin: 12.4 g/dL (ref 12.0–15.0)
MCH: 33.9 pg (ref 26.0–34.0)
MCHC: 32.7 g/dL (ref 30.0–36.0)
MCV: 103.6 fL — AB (ref 78.0–100.0)
Platelets: 401 10*3/uL — ABNORMAL HIGH (ref 150–400)
RBC: 3.66 MIL/uL — ABNORMAL LOW (ref 3.87–5.11)
RDW: 15 % (ref 11.5–15.5)
WBC: 12.6 10*3/uL — ABNORMAL HIGH (ref 4.0–10.5)

## 2014-11-08 LAB — BASIC METABOLIC PANEL
Anion gap: 13 (ref 5–15)
BUN: 53 mg/dL — ABNORMAL HIGH (ref 6–20)
CHLORIDE: 96 mmol/L — AB (ref 101–111)
CO2: 26 mmol/L (ref 22–32)
Calcium: 9.5 mg/dL (ref 8.9–10.3)
Creatinine, Ser: 1.47 mg/dL — ABNORMAL HIGH (ref 0.44–1.00)
GFR calc Af Amer: 34 mL/min — ABNORMAL LOW (ref >60)
GFR, EST NON AFRICAN AMERICAN: 30 mL/min — AB (ref >60)
GLUCOSE: 177 mg/dL — AB (ref 65–99)
POTASSIUM: 4.9 mmol/L (ref 3.5–5.1)
SODIUM: 135 mmol/L (ref 135–145)

## 2014-11-08 MED ORDER — ONDANSETRON HCL 4 MG PO TABS: 4.0000 mg | ORAL_TABLET | Freq: Four times a day (QID) | ORAL | Status: DC | PRN

## 2014-11-08 MED ORDER — DOCUSATE SODIUM 100 MG PO CAPS
100.0000 mg | ORAL_CAPSULE | Freq: Two times a day (BID) | ORAL | Status: DC
  Administered 2014-11-08 – 2014-11-10 (×4): 100 mg via ORAL
  Filled 2014-11-08 (×4): qty 1

## 2014-11-08 MED ORDER — MORPHINE SULFATE 2 MG/ML IJ SOLN: 0.5000 mg | INTRAMUSCULAR | Status: DC | PRN

## 2014-11-08 MED ORDER — HEPARIN SODIUM (PORCINE) 5000 UNIT/ML IJ SOLN
5000.0000 [IU] | Freq: Three times a day (TID) | INTRAMUSCULAR | Status: DC
  Administered 2014-11-08 – 2014-11-10 (×6): 5000 [IU] via SUBCUTANEOUS
  Filled 2014-11-08 (×6): qty 1

## 2014-11-08 MED ORDER — ASPIRIN EC 81 MG PO TBEC
81.0000 mg | DELAYED_RELEASE_TABLET | Freq: Every day | ORAL | Status: DC
  Administered 2014-11-09 – 2014-11-10 (×2): 81 mg via ORAL
  Filled 2014-11-08 (×2): qty 1

## 2014-11-08 MED ORDER — OXYCODONE HCL 5 MG PO TABS: 5.0000 mg | ORAL_TABLET | Freq: Four times a day (QID) | ORAL | Status: DC | PRN

## 2014-11-08 MED ORDER — LEVOTHYROXINE SODIUM 75 MCG PO TABS
75.0000 ug | ORAL_TABLET | Freq: Every day | ORAL | Status: DC
  Administered 2014-11-09 – 2014-11-10 (×2): 75 ug via ORAL
  Filled 2014-11-08 (×2): qty 1

## 2014-11-08 MED ORDER — ACETAMINOPHEN 500 MG PO TABS
500.0000 mg | ORAL_TABLET | Freq: Two times a day (BID) | ORAL | Status: DC
  Administered 2014-11-08 – 2014-11-10 (×4): 500 mg via ORAL
  Filled 2014-11-08 (×4): qty 1

## 2014-11-08 MED ORDER — ONDANSETRON HCL 4 MG/2ML IJ SOLN: 4.0000 mg | Freq: Four times a day (QID) | INTRAMUSCULAR | Status: DC | PRN

## 2014-11-08 MED ORDER — PREDNISONE 10 MG PO TABS
10.0000 mg | ORAL_TABLET | Freq: Every day | ORAL | Status: DC
  Administered 2014-11-09: 10 mg via ORAL
  Filled 2014-11-08: qty 1

## 2014-11-08 MED ORDER — ADULT MULTIVITAMIN W/MINERALS CH
1.0000 | ORAL_TABLET | Freq: Every day | ORAL | Status: DC
  Administered 2014-11-09 – 2014-11-10 (×2): 1 via ORAL
  Filled 2014-11-08 (×2): qty 1

## 2014-11-08 MED ORDER — SODIUM CHLORIDE 0.9 % IV SOLN
INTRAVENOUS | Status: DC
  Administered 2014-11-08 – 2014-11-10 (×3): via INTRAVENOUS

## 2014-11-08 MED ORDER — GABAPENTIN 100 MG PO CAPS
100.0000 mg | ORAL_CAPSULE | Freq: Two times a day (BID) | ORAL | Status: DC
  Administered 2014-11-08 – 2014-11-09 (×3): 100 mg via ORAL
  Filled 2014-11-08 (×3): qty 1

## 2014-11-08 NOTE — H&P (Signed)
[facility]          History and Physical    PCP:   Catalina Pizza, MD   EDP: None  Chief Complaint:  Low back and left hip pain  HPI: Patient is a 81 year old lady who is in remarkably good health with past medical history only significant for hypothyroidism who presents to the hospital today with the above-mentioned complaint. It appears that 2 weeks ago she had a fall in her bathroom and since then has been experiencing some low back and left hip pain. Her PCP sent her for x-rays of the lumbar spine, left hip as well as sacrum and coccyx  all which were negative for fracture. Initially was complaining of what sounded like a groin muscle strain and had initial improvement with local conservative care and Tylenol but about 5 days later started having more severe pain shooting down her left leg. She was started on a prednisone taper of which she has 1 day remaining, has also been using half a tablet of Vicodin as well as Neurontin. Despite this she still has significant pain and was referred to [place] today for management of her pain as well as to expedite her placement in short-term rehabilitation as she has been unable to perform her activities of daily living at home.  Allergies:   Allergies  Allergen Reactions  . Cephalexin Other (See Comments)    Pt. Doesn't remember.   . Penicillins Other (See Comments)    Pt. Cant remember.       Past Medical History  Diagnosis Date  . Thyroid disease     Past Surgical History  Procedure Laterality Date  . Total hip arthroplasty      left  . Abdominal hysterectomy    . Tonsillectomy      Prior to Admission medications   Medication Sig Start Date End Date Taking? Authorizing Provider  Acetaminophen (TYLENOL EXTRA STRENGTH PO) Take by mouth 2 (two) times daily with breakfast and lunch.    Historical Provider, MD  aspirin 81 MG tablet Take 81 mg by mouth daily.    Historical Provider, MD  calcium carbonate (OS-CAL)  1250 MG chewable tablet Chew 1 tablet by mouth 2 (two) times daily.     Historical Provider, MD  Calcium-Vitamin D-Vitamin K (VIACTIV PO) Take by mouth 2 (two) times daily.    Historical Provider, MD  Ginkgo Biloba 120 MG CAPS Take 120 mg by mouth daily with breakfast.    Historical Provider, MD  levothyroxine (SYNTHROID, LEVOTHROID) 75 MCG tablet Take 75 mcg by mouth daily.      Historical Provider, MD  metoprolol tartrate (LOPRESSOR) 25 MG tablet Take daily as needed for palpitations 09/02/14   Laqueta Linden, MD  Multiple Vitamins-Minerals (ONE-A-DAY WOMENS 50 PLUS PO) Take 1 tablet by mouth daily.      Historical Provider, MD    Social History:  reports that she has never smoked. She does not have any smokeless tobacco history on file. She reports that she does not drink alcohol or use illicit drugs.  Family History  Problem Relation Age of Onset  . Heart attack Mother   . Heart attack Father     Review of Systems:  Constitutional: Denies fever, chills, diaphoresis, appetite change and fatigue.  HEENT: Denies photophobia, eye pain, redness, hearing loss, ear pain, congestion, sore throat, rhinorrhea, sneezing, mouth sores, trouble swallowing, neck pain, neck stiffness and  tinnitus.   Respiratory: Denies SOB, DOE, cough, chest tightness,  and wheezing.   Cardiovascular: Denies chest pain, palpitations and leg swelling.  Gastrointestinal: Denies nausea, vomiting, abdominal pain, diarrhea, constipation, blood in stool and abdominal distention.  Genitourinary: Denies dysuria, urgency, frequency, hematuria, flank pain and difficulty urinating.  Endocrine: Denies: hot or cold intolerance, sweats, changes in hair or nails, polyuria, polydipsia. Musculoskeletal: Denies myalgias, back pain, joint swelling, arthralgias and gait problem.  Skin: Denies pallor, rash and wound.  Neurological: Denies dizziness, seizures, syncope, weakness, light-headedness, numbness and headaches.    Hematological: Denies adenopathy. Easy bruising, personal or family bleeding history  Psychiatric/Behavioral: Denies suicidal ideation, mood changes, confusion, nervousness, sleep disturbance and agitation   Physical Exam: Blood pressure 143/69, pulse 74, temperature 98.4 F (36.9 C), resp. rate 18, height  (1.549 m), weight 58.968 kg (130 lb), SpO2 95 %. General: Alert, awake, oriented 3 HEENT: Normocephalic, atraumatic, pupils equal round reactive to light, extraocular movements intact, somewhat dry mucous membranes. Neck: Supple, no JVD, no lymphadenopathy, no bruits, no goiter. Cardiovascular: Soft systolic ejection murmur best heard at the right upper sternal border, regular rate and rhythm, no rubs or gallops. Lungs: Clear to auscultation bilaterally. Abdomen: Soft, nontender, nondistended, positive bowel sounds, no masses or organomegaly noted, slight edematous area/bruising to her sacral area right above the beginning of her butt cheeks. No sign extremities: No clubbing, cyanosis or edema, positive pulses, small bruise over her right calf, large bruise over her left hip, prior scar from left hip replacement. Neurologic: Unable to fully assess given pain, moves all 4 spontaneously  Labs on Admission:  No results found for this or any previous visit (from the past 48 hour(s)).  Radiological Exams on Admission: No results found.  Assessment/Plan Principal Problem:   Low back pain Active Problems:   Hypothyroidism   Fall   CKD (chronic kidney disease) stage 2, GFR 60-89 ml/min   Low back pain -Following fall. -She does have significant bruising to her left hip, right calf as well as minor bruising to her sacral area. -We'll continue her prednisone taper which she has 1 day remaining, Neurontin, will give her low-dose oxycodone and morphine if needed, will schedule Tylenol twice a day to hopefully provide some sort of background pain relief. Need to be careful with dosing  of narcotic medications in this elderly lady. -We will obtain a PT evaluation was, will likely need short-term SNF.  Hypothyroidism -Continue home dose of Synthroid, per PCP notes TSH was checked about 3 weeks ago and was within range.  Chronic kidney disease stage II -Appears baseline creatinine is between 1.1 and 1.2, labs pending for today.  DVT prophylaxis -Subcutaneous heparin.  CODE STATUS -Full code.  Time Spent on Admission: 50 minutes  HERNANDEZ ACOSTA,ESTELA [facility] Pager: [phone] 11/08/2014, 2:18 PM

## 2014-11-09 ENCOUNTER — Encounter (HOSPITAL_COMMUNITY): Payer: Self-pay | Admitting: *Deleted

## 2014-11-09 DIAGNOSIS — W19XXXD Unspecified fall, subsequent encounter: Secondary | ICD-10-CM

## 2014-11-09 DIAGNOSIS — S3992XA Unspecified injury of lower back, initial encounter: Secondary | ICD-10-CM | POA: Diagnosis not present

## 2014-11-09 DIAGNOSIS — N182 Chronic kidney disease, stage 2 (mild): Secondary | ICD-10-CM

## 2014-11-09 DIAGNOSIS — M5441 Lumbago with sciatica, right side: Secondary | ICD-10-CM | POA: Diagnosis not present

## 2014-11-09 DIAGNOSIS — E038 Other specified hypothyroidism: Secondary | ICD-10-CM | POA: Diagnosis not present

## 2014-11-09 MED ORDER — TRAMADOL HCL 50 MG PO TABS: 50.0000 mg | ORAL_TABLET | Freq: Four times a day (QID) | ORAL | Status: DC | PRN

## 2014-11-09 MED ORDER — GABAPENTIN 300 MG PO CAPS
300.0000 mg | ORAL_CAPSULE | Freq: Two times a day (BID) | ORAL | Status: DC
  Administered 2014-11-09 – 2014-11-10 (×2): 300 mg via ORAL
  Filled 2014-11-09 (×2): qty 1

## 2014-11-09 MED ORDER — POLYETHYLENE GLYCOL 3350 17 G PO PACK
17.0000 g | PACK | Freq: Every day | ORAL | Status: DC
  Administered 2014-11-09 – 2014-11-10 (×2): 17 g via ORAL
  Filled 2014-11-09 (×2): qty 1

## 2014-11-09 NOTE — Progress Notes (Signed)
[facility] PROGRESS NOTE  DIAVION LABRADOR AVW:[id number] DOB: 23-Dec-1919 DOA: 11/08/2014 PCP: Catalina Pizza, MD  Assessment/Plan: Low Back/Left Hip Pain -Following fall at home with significant bruising to these areas. -X rays without fracture. -Await PT eval; will likely need ST-SNF following DC.  Hypothyroidism -Continue synthroid. -TSH checked recently by PCP WNL.  CKD Stage II -Appears to be a little worse than baseline. -Will give some IVF and recheck in am.  Code Status: Full Code Family Communication: Discussed with son Virl Diamond at bedside.  Disposition Plan: To be determined, likely ST-SNF   Consultants:  None   Antibiotics:  None   Subjective: C/o significant pain with ambulation but no pain lying in bed.  Objective: Filed Vitals:   11/08/14 1140 11/08/14 2212 11/09/14 0650  BP: 143/69 154/77 149/82  Pulse: 74 85 88  Temp: 98.4 F (36.9 C) 98.5 F (36.9 C) 98.4 F (36.9 C)  TempSrc:  Oral Oral  Resp: Height:  (1.549 m)    Weight: 58.968 kg (130 lb)    SpO2: 95% 98% 98%    Intake/Output Summary (Last 24 hours) at 11/09/14 1045 Last data filed at 11/09/14 0900  Gross per 24 hour  Intake    420 ml  Output   1050 ml  Net   -630 ml   Filed Weights   11/08/14 1140  Weight: 58.968 kg (130 lb)    Exam:   General:  AA Ox3  Cardiovascular: RRR, soft systolic ejection murmur best heard at the right upper sternal border  Respiratory: CTA B  Abdomen: S/NT/ND/+BS  Extremities: no C/C/E   Neurologic:  Non-focal  Data Reviewed: Basic Metabolic Panel:  Recent Labs Lab 11/08/14 1441  NA 135  K 4.9  CL 96*  CO2 26  GLUCOSE 177*  BUN 53*  CREATININE 1.47*  CALCIUM 9.5   Liver Function Tests: No results for input(s): AST, ALT, ALKPHOS, BILITOT, PROT, ALBUMIN in the last 168 hours. No results for input(s): LIPASE, AMYLASE in the last 168 hours. No results for input(s): AMMONIA in the last 168 hours. CBC:  Recent  Labs Lab 11/08/14 1441  WBC 12.6*  HGB 12.4  HCT 37.9  MCV 103.6*  PLT 401*   Cardiac Enzymes: No results for input(s): CKTOTAL, CKMB, CKMBINDEX, TROPONINI in the last 168 hours. BNP (last 3 results) No results for input(s): BNP in the last 8760 hours.  ProBNP (last 3 results) No results for input(s): PROBNP in the last 8760 hours.  CBG: No results for input(s): GLUCAP in the last 168 hours.  No results found for this or any previous visit (from the past 240 hour(s)).   Studies: No results found.  Scheduled Meds: . acetaminophen  500 mg Oral BID WC  . aspirin EC  81 mg Oral Daily  . docusate sodium  100 mg Oral BID  . gabapentin  100 mg Oral BID  . heparin  5,000 Units Subcutaneous 3 times per day  . levothyroxine  75 mcg Oral QAC breakfast  . multivitamin with minerals  1 tablet Oral Daily  . predniSONE  10 mg Oral Q breakfast   Continuous Infusions: . sodium chloride 50 mL/hr at 11/08/14 1601    Principal Problem:   Low back pain Active Problems:   Hypothyroidism   Fall   CKD (chronic kidney disease) stage 2, GFR 60-89 ml/min    Time spent: 15 minutes. Greater than 50% of this time was spent in direct contact  with the patient coordinating care.    Chaya Jan  [facility] Pager [phone]  If 7PM-7AM, please contact night-coverage at [email or web address], password [facility] 11/09/2014, 10:45 AM

## 2014-11-09 NOTE — Progress Notes (Signed)
Patient refuses pain medication due to only having pain with activity.

## 2014-11-10 ENCOUNTER — Inpatient Hospital Stay
Admission: RE | Admit: 2014-11-10 | Discharge: 2014-12-11 | Disposition: A | Discharge status: Home / Self Care | Payer: Medicare HMO | Source: Ambulatory Visit | Attending: Internal Medicine | Admitting: Internal Medicine

## 2014-11-10 DIAGNOSIS — N182 Chronic kidney disease, stage 2 (mild): Secondary | ICD-10-CM | POA: Diagnosis not present

## 2014-11-10 DIAGNOSIS — S3992XA Unspecified injury of lower back, initial encounter: Secondary | ICD-10-CM | POA: Diagnosis not present

## 2014-11-10 DIAGNOSIS — E038 Other specified hypothyroidism: Secondary | ICD-10-CM | POA: Diagnosis not present

## 2014-11-10 DIAGNOSIS — M5442 Lumbago with sciatica, left side: Secondary | ICD-10-CM | POA: Diagnosis not present

## 2014-11-10 LAB — BASIC METABOLIC PANEL
ANION GAP: 10 (ref 5–15)
BUN: 42 mg/dL — AB (ref 6–20)
CHLORIDE: 104 mmol/L (ref 101–111)
CO2: 27 mmol/L (ref 22–32)
Calcium: 8.5 mg/dL — ABNORMAL LOW (ref 8.9–10.3)
Creatinine, Ser: 1.15 mg/dL — ABNORMAL HIGH (ref 0.44–1.00)
GFR calc non Af Amer: 40 mL/min — ABNORMAL LOW (ref >60)
GFR, EST AFRICAN AMERICAN: 46 mL/min — AB (ref >60)
GLUCOSE: 86 mg/dL (ref 65–99)
POTASSIUM: 4.6 mmol/L (ref 3.5–5.1)
SODIUM: 141 mmol/L (ref 135–145)

## 2014-11-10 MED ORDER — TRAMADOL HCL 50 MG PO TABS: 50.0000 mg | ORAL_TABLET | Freq: Four times a day (QID) | ORAL | Status: DC | PRN

## 2014-11-10 MED ORDER — GABAPENTIN 300 MG PO CAPS: 300.0000 mg | ORAL_CAPSULE | Freq: Two times a day (BID) | ORAL | Status: DC

## 2014-11-10 NOTE — Plan of Care (Signed)
Problem: Discharge Progression Outcomes Goal: Discharge plan in place and appropriate Outcome: Completed/Met Date Met:  11/10/14 [facility] Goal: Complications resolved/controlled Outcome: [facility] Goal: Activity appropriate for discharge plan Outcome: Adequate for Discharge [facility]

## 2014-11-10 NOTE — Discharge Summary (Signed)
Physician Discharge Summary  Kimberly Velazquez GNF:[id number] DOB: Jun 26, 1919 DOA: 11/08/2014  PCP: Catalina Pizza, MD  Admit date: 11/08/2014 Discharge date: 11/10/2014  Time spent: 30 minutes  Recommendations for Outpatient Follow-up:  -Will be discharged to SNF today for ST rehab purposes.   Discharge Diagnoses:  Principal Problem:   Low back pain Active Problems:   Hypothyroidism   Fall   CKD (chronic kidney disease) stage 2, GFR 60-89 ml/min   Discharge Condition: Stable and improved  Filed Weights   11/08/14 1140  Weight: 58.968 kg (130 lb)    History of present illness:  Patient is a 81 year old lady who is in remarkably good health with past medical history only significant for hypothyroidism who presents to the hospital today with the above-mentioned complaint. It appears that 2 weeks ago she had a fall in her bathroom and since then has been experiencing some low back and left hip pain. Her PCP sent her for x-rays of the lumbar spine, left hip as well as sacrum and coccyx all which were negative for fracture. Initially was complaining of what sounded like a groin muscle strain and had initial improvement with local conservative care and Tylenol but about 5 days later started having more severe pain shooting down her left leg. She was started on a prednisone taper of which she has 1 day remaining, has also been using half a tablet of Vicodin as well as Neurontin. Despite this she still has significant pain and was referred to [place] today for management of her pain as well as to expedite her placement in short-term rehabilitation as she has been unable to perform her activities of daily living at home.  Hospital Course:   Low Back/Left Hip Pain -Following fall at home with significant bruising to these areas. -X rays without fracture. -PT recs short term SNF for rehab purposes.  Hypothyroidism -Continue synthroid. -TSH checked recently by PCP WNL.  Acute on CKD Stage  II -Back to baseline on DC at 1.15.  Procedures:  None   Consultations:  None  Discharge Instructions  Discharge Instructions    Diet - low sodium heart healthy    Complete by:  As directed      Increase activity slowly    Complete by:  As directed             Medication List    STOP taking these medications        predniSONE 10 MG tablet  Commonly known as:  DELTASONE      TAKE these medications        aspirin 81 MG tablet  Take 81 mg by mouth daily.     calcium carbonate 1250 (500 CA) MG chewable tablet  Commonly known as:  OS-CAL  Chew 1 tablet by mouth 2 (two) times daily.     gabapentin 300 MG capsule  Commonly known as:  NEURONTIN  Take 1 capsule (300 mg total) by mouth 2 (two) times daily.     Ginkgo Biloba 120 MG Caps  Take 120 mg by mouth daily with breakfast.     levothyroxine 75 MCG tablet  Commonly known as:  SYNTHROID, LEVOTHROID  Take 75 mcg by mouth daily.     metoprolol tartrate 25 MG tablet  Commonly known as:  LOPRESSOR  Take daily as needed for palpitations     ONE-A-DAY WOMENS 50 PLUS PO  Take 1 tablet by mouth daily.     traMADol 50 MG tablet  Commonly  known as:  ULTRAM  Take 1 tablet (50 mg total) by mouth every 6 (six) hours as needed for moderate pain.     TYLENOL EXTRA STRENGTH PO  Take by mouth 2 (two) times daily with breakfast and lunch.     VIACTIV PO  Take by mouth 2 (two) times daily.       Allergies  Allergen Reactions  . Cephalexin Other (See Comments)    Pt. Doesn't remember.   . Penicillins Other (See Comments)    Pt. Cant remember.       The results of significant diagnostics from this hospitalization (including imaging, microbiology, ancillary and laboratory) are listed below for reference.    Significant Diagnostic Studies: Dg Lumbar Spine Complete  11/03/2014   CLINICAL DATA:  Status post fall 1 week ago striking the buttock region, persistent left sided hip discomfort and bruising; low back and  sacral and coccygeal discomfort  EXAM: LUMBAR SPINE - COMPLETE 4+ VIEW  COMPARISON:  Lumbar spine series of July 23, 2008  FINDINGS: There is severe dextrocurvature with a rotatory component centered in mid lumbar spine. There is multilevel degenerative disc space narrowing. No vertebral body compression is observed. As best as can be determined the sacrum is intact.  IMPRESSION: Severe dextroscoliosis with a rotatory component. There is multilevel degenerative disc disease. There is no acute compression fracture.   Electronically Signed   By: David  [place] M.D.   On: 11/03/2014 12:08   Dg Pelvis 1-2 Views  11/03/2014   CLINICAL DATA:  Low back pain.  Fall 1 week ago.  Left side pain.  EXAM: PELVIS - 1-2 VIEW  COMPARISON:  01/28/2010  FINDINGS: Prior left hip replacement. Normal AP alignment. Moderate degenerative changes in the right hip. No acute bony abnormality. Specifically, no fracture, subluxation, or dislocation. Soft tissues are intact.  IMPRESSION: Prior left hip replacement.  No acute bony abnormality.   Electronically Signed   By: Charlett Nose M.D.   On: 11/03/2014 12:11   Dg Sacrum/coccyx  11/03/2014   CLINICAL DATA:  Fall on tail bone approximately 1 week ago. Continued pain, particularly on left side with left side bruising.  EXAM: SACRUM AND COCCYX - 2+ VIEW  COMPARISON:  Pelvic image performed today.  FINDINGS: No visible sacral or coccygeal fracture. Diffuse osteopenia. Prior left hip replacement.  IMPRESSION: No visible sacral or coccygeal fracture appear   Electronically Signed   By: Charlett Nose M.D.   On: 11/03/2014 12:10    Microbiology: No results found for this or any previous visit (from the past 240 hour(s)).   Labs: Basic Metabolic Panel:  Recent Labs Lab 11/08/14 1441 11/10/14 0525  NA 135 141  K 4.9 4.6  CL 96* 104  CO2 26 27  GLUCOSE 177* 86  BUN 53* 42*  CREATININE 1.47* 1.15*  CALCIUM 9.5 8.5*   Liver Function Tests: No results for input(s): AST, ALT,  ALKPHOS, BILITOT, PROT, ALBUMIN in the last 168 hours. No results for input(s): LIPASE, AMYLASE in the last 168 hours. No results for input(s): AMMONIA in the last 168 hours. CBC:  Recent Labs Lab 11/08/14 1441  WBC 12.6*  HGB 12.4  HCT 37.9  MCV 103.6*  PLT 401*   Cardiac Enzymes: No results for input(s): CKTOTAL, CKMB, CKMBINDEX, TROPONINI in the last 168 hours. BNP: BNP (last 3 results) No results for input(s): BNP in the last 8760 hours.  ProBNP (last 3 results) No results for input(s): PROBNP in the last 8760 hours.  CBG: No results for input(s): GLUCAP in the last 168 hours.     SignedChaya Jan  [facility] Pager: [phone] 11/10/2014, 10:28 AM

## 2014-11-10 NOTE — Evaluation (Signed)
Physical Therapy Evaluation Patient Details Name: Kimberly Velazquez MRN: [id number] DOB: 14-Feb-1920 Today's Date: 11/10/2014   History of Present Illness  Patient is a 81 year old lady who is in remarkably good health with past medical history only significant for hypothyroidism who presents to the hospital today with the above-mentioned complaint. It appears that 2 weeks ago she had a fall in her bathroom and since then has been experiencing some low back and left hip pain. Her PCP sent her for x-rays of the lumbar spine, left hip as well as sacrum and coccyx all which were negative for fracture. Initially was complaining of what sounded like a groin muscle strain and had initial improvement with local conservative care and Tylenol but about 5 days later started having more severe pain shooting down her left leg. She was started on a prednisone taper of which she has 1 day remaining, has also been using half a tablet of Vicodin as well as Neurontin. Despite this she still has significant pain and was referred to [place] today for management of her pain as well as to expedite her placement in short-term rehabilitation as she has been unable to perform her activities of daily living at home.  Clinical Impression   Pt was seen for evaluation.  She was very pleasant, alert and oriented with no pain at rest.  She did appear to be fairly anxious as she is normally very healthy and independent.  She does report, however, numerous falls in the past but with no injury.  She is able to slowly move both LEs actively in the bed very slowly and tenuously without pain.  HOB needed to be fully elevated and mod assist needed to transfer her supine to sit.  She was able to stand to a walker and slowly ambulate 20' with stable gait.  She will definitely need SNF at d/c but surely should recover full mobility.  She will probably need to use a walker for all gait in the future.    Follow Up Recommendations SNF    Equipment  Recommendations  None recommended by PT    Recommendations for Other Services   OT at SNF    Precautions / Restrictions Precautions Precautions: Fall Restrictions Weight Bearing Restrictions: No      Mobility  Bed Mobility Overal bed mobility: Needs Assistance Bed Mobility: Rolling Rolling: Min assist (use of handrails)   Supine to sit: Mod assist;HOB elevated     General bed mobility comments: pt was able to slide her LEs to EOB but needed max assist to mobilize her buttocks to EOB...no pain with transfer as long as it is done in this way  Transfers Overall transfer level: Needs assistance Equipment used: Standard walker Transfers: Sit to/from Stand Sit to Stand: Min guard            Ambulation/Gait Ambulation/Gait assistance: Min guard Ambulation Distance (Feet): 20 Feet Assistive device: Standard walker Gait Pattern/deviations: Trunk flexed   Gait velocity interpretation: <1.8 ft/sec, indicative of risk for recurrent falls General Gait Details: pt is able to flex hips to take steps now whereas she states at home she was unable to do so                      Balance Overall balance assessment: History of Falls;Needs assistance Sitting-balance support: No upper extremity supported;Feet supported Sitting balance-Leahy Scale: Good     Standing balance support: Bilateral upper extremity supported Standing balance-Leahy Scale: Fair  Pertinent Vitals/Pain Pain Assessment: No/denies pain (at rest)    Home Living Family/patient expects to be discharged to:: Skilled nursing facility Living Arrangements: Alone                    Prior Function Level of Independence: Independent         Comments: usually used a cane outside of the home             Extremity/Trunk Assessment   Upper Extremity Assessment: Overall WFL for tasks assessed           Lower Extremity Assessment: Generalized  weakness      Cervical / Trunk Assessment: Kyphotic;Other exceptions  Communication   Communication: No difficulties  Cognition Arousal/Alertness: Awake/alert Behavior During Therapy: Anxious Overall Cognitive Status: Within Functional Limits for tasks assessed                      General Comments      Exercises        Assessment/Plan    PT Assessment Patient needs continued PT services  PT Diagnosis Difficulty walking;Generalized weakness;Acute pain   PT Problem List Decreased strength;Decreased activity tolerance;Decreased balance;Decreased mobility;Decreased knowledge of use of DME;Decreased safety awareness;Pain  PT Treatment Interventions Gait training;Functional mobility training;Therapeutic activities;Therapeutic exercise;Patient/family education   PT Goals (Current goals can be found in the Care Plan section) Acute Rehab PT Goals Patient Stated Goal: none stated PT Goal Formulation: With patient/family Time For Goal Achievement: 11/24/14 Potential to Achieve Goals: Good    Frequency Min 3X/week   Barriers to discharge Inaccessible home environment;Decreased caregiver support 3 steps into the home, lives alone                   End of Session Equipment Utilized During Treatment: Gait belt Activity Tolerance: Patient tolerated treatment well Patient left: in chair;with call bell/phone within reach;with family/visitor present Nurse Communication: Mobility status    Functional Assessment Tool Used: clinical judgement Functional Limitation: Mobility: Walking and moving around Mobility: Walking and Moving Around Current Status [phone]): At least 40 percent but less than 60 percent impaired, limited or restricted Mobility: Walking and Moving Around Goal Status [phone]): At least 20 percent but less than 40 percent impaired, limited or restricted    Time: 0981-1914 PT Time Calculation (min) (ACUTE ONLY): 53 min   Charges:   PT  Evaluation $Initial PT Evaluation Tier I: 1 Procedure     PT G Codes:   PT G-Codes **NOT FOR INPATIENT CLASS** Functional Assessment Tool Used: clinical judgement Functional Limitation: Mobility: Walking and moving around Mobility: Walking and Moving Around Current Status ([id number]): At least 40 percent but less than 60 percent impaired, limited or restricted Mobility: Walking and Moving Around Goal Status [phone]): At least 20 percent but less than 40 percent impaired, limited or restricted    Konrad Penta  PT 11/10/2014, 9:52 AM [phone]

## 2014-11-10 NOTE — Clinical Social Work Note (Signed)
Clinical Social Work Assessment  Patient Details  Name: Kimberly Velazquez MRN: [id number] Date of Birth: [birth year]  Date of referral:  11/10/14               Reason for consult:  Facility Placement                Permission sought to share information with:  Family Supports Permission granted to share information::  Yes, Verbal Permission Granted  Name::     [employer]ed forces logistics/support/administrative officer::     Relationship::  son  Contact Information:     Housing/Transportation Living arrangements for the past 2 months:  Single Family Home Source of Information:  Patient, Adult Children Patient Interpreter Needed:  None Criminal Activity/Legal Involvement Pertinent to Current Situation/Hospitalization:  No - Comment as needed Significant Relationships:  Adult Children, Other Family Members Lives with:  Self Do you feel safe going back to the place where you live?  No Need for family participation in patient care:  Yes (Comment)  Care giving concerns:  Pt lives alone.    Social Worker assessment / plan:  CSW met with pt and pt's son, Kimberly Velazquez at bedside. Pt alert and oriented and reports she lives alone. Her granddaughter and her family live across the street. Family is very involved and supportive. Pt indicates she fell about 2 weeks ago at home. For several days after, she was able to ambulate with a walker although she was in a lot of pain. She states it got to the point where she stayed in her lift chair and only got up to get to the potty chair and this required assist due to pain. Pt shared she has fallen a lot in the past, but this was different. Her PCP ordered xrays, which showed no fracture. Family feel pt probably should have come to hospital sooner, but were trying to make it work at home. At baseline, pt is fairly independent. PT evaluated pt and recommend SNF. CSW explained placement process and [employer] authorization. Family had started talking to [facility] about placement and would request this facility. Pt is stable for  d/c today per MD. Family report pt cannot pay privately at [facility] until [place] decision made. Discussed with supervisor who offers 5 day LOG. Pt and son are aware of of this and discussed thoroughly. [facility] accepts pt and will transfer today. [facility] to start authorization. Son understands process to apply for Medicaid if needed.   Employment status:  Retired Nurse, adult PT Recommendations:  [place] / Referral to community resources:  [place]  Patient/Family's Response to care:  Pt and family agree SNF is needed at d/c.   Patient/Family's Understanding of and Emotional Response to Diagnosis, Current Treatment, and Prognosis:  Pt and family appear to be very informed on admission diagnosis and health history.   Emotional Assessment Appearance:  Appears younger than stated age Attitude/Demeanor/Rapport:  Other (Cooperative) Affect (typically observed):  Appropriate, Pleasant Orientation:  Oriented to Self, Oriented to Place, Oriented to  Time, Oriented to Situation Alcohol / Substance use:  Not Applicable Psych involvement (Current and /or in the community):  No (Comment)  Discharge Needs  Concerns to be addressed:  Discharge Planning Concerns Readmission within the last 30 days:  No Current discharge risk:  Lives alone Barriers to Discharge:  No Barriers Identified   Salome Arnt, [place] 11/10/2014, 11:12 AM [phone]

## 2014-11-10 NOTE — Clinical Social Work Placement (Signed)
   CLINICAL SOCIAL WORK PLACEMENT  NOTE  Date:  11/10/2014  Patient Details  Name: Kimberly Velazquez MRN: [id number] Date of Birth: Dec 12, 1919  Clinical Social Work is seeking post-discharge placement for this patient at the Skilled  Nursing Facility level of care (*CSW will initial, date and re-position this form in  chart as items are completed):  Yes   Patient/family provided with [facility] Clinical Social Work Department's list of facilities offering this level of care within the geographic area requested by the patient (or if unable, by the patient's family).  Yes   Patient/family informed of their freedom to choose among providers that offer the needed level of care, that participate in Medicare, Medicaid or managed care program needed by the patient, have an available bed and are willing to accept the patient.  Yes   Patient/family informed of [facility]'s ownership interest in [facility] and [facility], as well as of the fact that they are under no obligation to receive care at these facilities.  PASRR submitted to EDS on       PASRR number received on       Existing PASRR number confirmed on 11/10/14     FL2 transmitted to all facilities in geographic area requested by pt/family on 11/10/14     FL2 transmitted to all facilities within larger geographic area on       Patient informed that his/her managed care company has contracts with or will negotiate with certain facilities, including the following:        Yes   Patient/family informed of bed offers received.  Patient chooses bed at [facility]     Physician recommends and patient chooses bed at      Patient to be transferred to [facility] on 11/10/14.  Patient to be transferred to facility by staff     Patient family notified on 11/10/14 of transfer.  Name of family member notified:  Virl Diamond- son     PHYSICIAN       Additional Comment:     _______________________________________________ Karn Cassis, LCSW 11/10/2014, 11:10 AM [phone]

## 2014-11-10 NOTE — Care Management Note (Signed)
Case Management Note  Patient Details  Name: Kimberly Velazquez MRN: [id number] Date of Birth: [birth year]  Subjective/Objective:                  Pt admitted from home with falls with pain. Pt lives alone and has family that is very active in the care of the pt. Pt has walker and BSC, and lift chair in the home. Pt has required more assistance with ADL's compared to before falls.  Action/Plan: PT is recommending SNF. CSW is aware and pt has bed at [facility]. CSW to arrange discharge to [facility] today.  Expected Discharge Date:  11/11/14               Expected Discharge Plan:  Skilled Nursing Facility  In-House Referral:  Clinical Social Work  Discharge planning Services  CM Consult  Post Acute Care Choice:  NA Choice offered to:  NA  DME Arranged:    DME Agency:     HH Arranged:    HH Agency:     Status of Service:  Completed, signed off  Medicare Important Message Given:    Date Medicare IM Given:    Medicare IM give by:    Date Additional Medicare IM Given:    Additional Medicare Important Message give by:     If discussed at Long Length of Stay Meetings, dates discussed:    Additional Comments:  Cheryl Flash, RN 11/10/2014, 10:43 AM

## 2014-11-10 NOTE — Progress Notes (Signed)
Report called to [facility] LPN [facility]

## 2014-11-11 ENCOUNTER — Encounter (HOSPITAL_COMMUNITY)
Admission: AD | Admit: 2014-11-11 | Discharge: 2014-11-11 | Disposition: A | Discharge status: Home / Self Care | Payer: Medicare HMO | Source: Skilled Nursing Facility | Attending: Internal Medicine | Admitting: Internal Medicine

## 2014-11-11 LAB — BASIC METABOLIC PANEL
Anion gap: 9 (ref 5–15)
BUN: 40 mg/dL — AB (ref 6–20)
CO2: 27 mmol/L (ref 22–32)
Calcium: 8.9 mg/dL (ref 8.9–10.3)
Chloride: 103 mmol/L (ref 101–111)
Creatinine, Ser: 1.19 mg/dL — ABNORMAL HIGH (ref 0.44–1.00)
GFR calc Af Amer: 44 mL/min — ABNORMAL LOW (ref >60)
GFR, EST NON AFRICAN AMERICAN: 38 mL/min — AB (ref >60)
GLUCOSE: 92 mg/dL (ref 65–99)
Potassium: 4.3 mmol/L (ref 3.5–5.1)
SODIUM: 139 mmol/L (ref 135–145)

## 2014-11-11 LAB — CBC WITH DIFFERENTIAL/PLATELET
BASOS ABS: 0 10*3/uL (ref 0.0–0.1)
BASOS PCT: 1 % (ref 0–1)
Eosinophils Absolute: 0.2 10*3/uL (ref 0.0–0.7)
Eosinophils Relative: 3 % (ref 0–5)
HCT: 34.3 % — ABNORMAL LOW (ref 36.0–46.0)
Hemoglobin: 11.1 g/dL — ABNORMAL LOW (ref 12.0–15.0)
LYMPHS ABS: 1.6 10*3/uL (ref 0.7–4.0)
LYMPHS PCT: 19 % (ref 12–46)
MCH: 33.5 pg (ref 26.0–34.0)
MCHC: 32.4 g/dL (ref 30.0–36.0)
MCV: 103.6 fL — ABNORMAL HIGH (ref 78.0–100.0)
MONO ABS: 0.8 10*3/uL (ref 0.1–1.0)
Monocytes Relative: 10 % (ref 3–12)
NEUTROS PCT: 67 % (ref 43–77)
Neutro Abs: 5.6 10*3/uL (ref 1.7–7.7)
Platelets: 387 10*3/uL (ref 150–400)
RBC: 3.31 MIL/uL — ABNORMAL LOW (ref 3.87–5.11)
RDW: 15.1 % (ref 11.5–15.5)
WBC: 8.2 10*3/uL (ref 4.0–10.5)

## 2014-11-12 ENCOUNTER — Non-Acute Institutional Stay (SKILLED_NURSING_FACILITY): Payer: Medicare HMO | Admitting: Internal Medicine

## 2014-11-12 DIAGNOSIS — M545 Low back pain: Secondary | ICD-10-CM | POA: Diagnosis not present

## 2014-11-12 DIAGNOSIS — E038 Other specified hypothyroidism: Secondary | ICD-10-CM

## 2014-11-12 DIAGNOSIS — D649 Anemia, unspecified: Secondary | ICD-10-CM

## 2014-11-12 DIAGNOSIS — W19XXXD Unspecified fall, subsequent encounter: Secondary | ICD-10-CM | POA: Diagnosis not present

## 2014-11-12 DIAGNOSIS — N182 Chronic kidney disease, stage 2 (mild): Secondary | ICD-10-CM | POA: Diagnosis not present

## 2014-11-12 NOTE — Progress Notes (Signed)
Patient ID: Kimberly Velazquez, female   DOB: [birth year], 81 y.o.   MRN: [id number]    this is an acute visit.  Level care skilled.  Facility [facility].  Chief complaint- acute visit status post hospitalization for left back and hip pain.  History of present illness.  Patient is a reasonably healthy 81 year old female with a history of hypothyroidism who came to the hospital complaining of acting hip pain.  Apparently she had a fall 2 weeks previously and been experiencing low back and left hip pain.   X-rays obtain by her primary care provider previously were negative for any acute process.   initially was thought possibly to have muscle strain and had some improvement with local conservative care and Tylenol but apparently started having more severe pain shooting down her leg after about 5 days.  She was started on prednisone taper and was using low-dose Vicodin as well as Neurontin but still had pain.   She is no longer really able to perform her activities daily living secondary to the pain and this came to the hospital.   per hospital evaluation she has been referred here for short-term rehabilitation.   she also has some history of chronic idney disease stage II however on hospital discharge this was relatively at baseline with creatinine of 1.19 BUN was 40.   in regards to hypothyroidism this apparently was followed by her primary care provider and was normal recently.   currently patient is complaining of some back and hip pain - also appears somewhat anxious but after speaking with her she appeared to be calmer   Previous medical history.  Low back pain.  Hypothyroidism.  Chronic kidney disease stage II.  History of falls.   his surgical history.  Left hip arthroplasty.  Abdominal hysterectomy.  Tonsillectomy.   social history-patient apparently lived by herself but did have close family support fact she is Dr. Margo Aye grand mother-in-law.   has no history of  tobacco alcohol or illicit drug use.   family history positive for MI in both her mother and father.   review of systems.  General does not complain of any fever or chills.  Skin-- does have a violaceous hematoma right lower leg and back also one on the left hip.  Head ears eyes nose mouth and throat does not complain of visual changes or sore throat.   respiratory no shortness of breath or cough.    Cardiac does not complain of chest pain.-- or significant edema.   GI does not complain of nausea vomiting diarrhea or constipation.   musculoskeletal does complain at times of some back and hip pain--.   Neurologic is not really complaining of neuropathic symptoms at this time or headache or dizziness.   psych is not complaining of depression or anxiety currently.  Physical exam.  She is afebrile pulse 78 respirations 18 blood pressure variable 110/58-160/74-92/51 again there is quite a bit of variability.   Gen. This is a pleasant elderly female who looks younger than her stated age.  Her skin is warm and dry she does have a violaceous hematoma upper back right lower leg and also left hip.  Eyes pupils appear reactive to light sclera and conjunctiva clear visual acuity appears intact.  Oropharynx clear mucous membranes moist.  Chest is clear to auscultation there is no labored breathing.   heart is regular rate and rhythm without murmur gallop or rub she has somewhat reduced pedal pulses bilaterally no significant lower extremity edema.  Abdomen soft nontender positive bowel sounds.  Muscle skeletal general frailty but I do not note any deformities she is able to stand move all her extremities 4.  Neurologic is grossly intact her speech is clear no lateralizing findings.  Sites she is alert and oriented doesn't and appropriate a bit anxious at times.   Labs.  All the second 2016.  Sodium 139 potassium 4.3 BUN 40 creatinine 1.19.  WBC 8.2 hemoglobin 11.1  platelets 387.  Assessment and plan.  #1 history of weakness with falls back and hip pain- at this point she appears to be stable wil need PT and OT - she does have tramadol as needed for pain as well as Tylenol extra strength twice a day this will have to be encouraged she also continues on Neurontin.   #2 history of hypothyroidism she is on Synthroid recent TSH was within normal limits.  #3 what appears to be some history of hypertension she is on metoprolol this will have to be watched she has variable blood pressures as noted above I do not see consistentlows and highs however this will have to be monitored.   #4 anemia-her  Hgbhas dipped a bit from previous level light  On 7- 30 when it was 12.4s will check a CBC next week to make sure this is stable also will update metabolic panel with her history of renal issues.  #5 history of chronic kidney disease stage II creatinine apparently has returned to baseline at 1.19 BUN of 40 this will need to be rechecked first laboratory day next week.  Clinically patient appears stable her ability to return home however is still in question she will need rehabilitation and strengthening.   [id number]-- of note greater than 35 minutes spent assessing patient-addressing her concerns at bedside- reviewing her chart-discussing her status with nursing staff- and coordinating and formulating a plan of care-of note greater than 50% of time spent coordinating plan of care including fairly extensive discussion with patient at bedside

## 2014-11-14 ENCOUNTER — Encounter: Payer: Self-pay | Admitting: Internal Medicine

## 2014-11-17 ENCOUNTER — Non-Acute Institutional Stay (SKILLED_NURSING_FACILITY): Payer: Medicare HMO | Admitting: Internal Medicine

## 2014-11-17 ENCOUNTER — Encounter: Payer: Self-pay | Admitting: Internal Medicine

## 2014-11-17 DIAGNOSIS — M25559 Pain in unspecified hip: Secondary | ICD-10-CM | POA: Diagnosis not present

## 2014-11-17 DIAGNOSIS — N182 Chronic kidney disease, stage 2 (mild): Secondary | ICD-10-CM | POA: Diagnosis not present

## 2014-11-17 DIAGNOSIS — I10 Essential (primary) hypertension: Secondary | ICD-10-CM | POA: Diagnosis not present

## 2014-11-17 DIAGNOSIS — G8929 Other chronic pain: Secondary | ICD-10-CM | POA: Diagnosis not present

## 2014-11-17 LAB — CBC
HEMATOCRIT: 36.1 % (ref 36.0–46.0)
Hemoglobin: 11.6 g/dL — ABNORMAL LOW (ref 12.0–15.0)
MCH: 33.5 pg (ref 26.0–34.0)
MCHC: 32.1 g/dL (ref 30.0–36.0)
MCV: 104.3 fL — AB (ref 78.0–100.0)
Platelets: 317 10*3/uL (ref 150–400)
RBC: 3.46 MIL/uL — AB (ref 3.87–5.11)
RDW: 15.1 % (ref 11.5–15.5)
WBC: 8.6 10*3/uL (ref 4.0–10.5)

## 2014-11-17 LAB — BASIC METABOLIC PANEL
Anion gap: 9 (ref 5–15)
BUN: 45 mg/dL — ABNORMAL HIGH (ref 6–20)
CALCIUM: 9 mg/dL (ref 8.9–10.3)
CHLORIDE: 100 mmol/L — AB (ref 101–111)
CO2: 29 mmol/L (ref 22–32)
CREATININE: 1.6 mg/dL — AB (ref 0.44–1.00)
GFR calc Af Amer: 31 mL/min — ABNORMAL LOW (ref >60)
GFR, EST NON AFRICAN AMERICAN: 27 mL/min — AB (ref >60)
Glucose, Bld: 95 mg/dL (ref 65–99)
POTASSIUM: 4.2 mmol/L (ref 3.5–5.1)
SODIUM: 138 mmol/L (ref 135–145)

## 2014-11-17 NOTE — Progress Notes (Signed)
Patient ID: Kimberly Velazquez, female   DOB: 1920/04/05, 81 y.o.   MRN: [id number]      this is an acute visit.  Level care skilled.  Facility [facility].  Chief complaint- acute visit secondary to renal insufficiency.  History of present illness.  Patient is a reasonably healthy 81 year old female with a history of hypothyroidism who came to the hospital complaining of  left hip pain.  Apparently she had a fall 2 weeks previously and been experiencing low back and left hip pain.     She was started on prednisone taper and was using low-dose Vicodin as well as Neurontin but still had pain.   She is no longer really able to perform her activities daily living secondary to the pain and this came to the hospital.   per hospital evaluation she has been referred here for short-term rehabilitation.   she also has some history of chronic idney disease stage II however on hospital discharge this was relatively at baseline with creatinine of 1.19 BUN was 40.  Updated lab however shows creatinine has risen to 1.6.  Per review of hospital records appears her creatinine ranged from 1.19-1.47.  She is not on any diarrhetic'however I do note she is on Neurontin which can have renal implications she is on this apparently for neuropathy and pain.  Her vital signs appear to be stable-she has some complaints of pain yet at times but this appears to be under somewhat better control she is receiving tramadol 50 mg every 6 hours when necessary-her Neurontin dose is 300 mg twice a day.  She is also on Tylenol 500 mg twice a day     Previous medical history.  Low back pain.  Hypothyroidism.  Chronic kidney disease stage II.  History of falls.   his surgical history.  Left hip arthroplasty.  Abdominal hysterectomy.  Tonsillectomy.   social history-patient apparently lived by herself but did have close family support fact she is Dr. Margo Aye grand mother-in-law.   has no history of  tobacco alcohol or illicit drug use.   family history positive for MI in both her mother and father.   review of systems.  General does not complain of any fever or chills.  Skin-- does have a violaceous hematoma right lower leg and back also one on the left hip.  Head ears eyes nose mouth and throat does not complain of visual changes or sore throat.   respiratory no shortness of breath or cough.    Cardiac does not complain of chest pain.-- or significant edema.   GI does not complain of nausea vomiting diarrhea or constipation.   musculoskeletal does complain at times of some back and hip pain--. This is a little better however   Neurologic is not really complaining of neuropathic symptoms at this time or headache or dizziness.   psych is not complaining of depression or anxiety currently.  Physical exam.   Temperature 99.0 pulse 92 respirations 16 blood pressure 100/56-110/58.   Gen. This is a pleasant elderly female who looks younger than her stated age.  Her skin is warm and dry she does have a violaceous hematoma upper back right lower leg and also left hip.--Skin turgor does not appear to be impaired  Eyes pupils appear reactive to light sclera and conjunctiva clear visual acuity appears intact.  Oropharynx clear mucous membranes moist.  Chest is clear to auscultation there is no labored breathing.   heart is regular rate and rhythm without murmur  gallop or rub she has somewhat reduced pedal pulses bilaterally no significant lower extremity edema.  Abdomen soft nontender positive bowel sounds.   GU cannot really appreciate any suprapubic tenderness  Muscle skeletal general frailty but I do not note any deformities she is able to stand move all her extremities 4.  Neurologic is grossly intact her speech is clear no lateralizing findings.  .   Labs.  11/17/2014.  Sodium 138 potassium 4.2 BUN 45 creatinine 1.6.  WBC 8.6 hemoglobin 11.6 platelets  317  All the second 2016.  Sodium 139 potassium 4.3 BUN 40 creatinine 1.19.  WBC 8.2 hemoglobin 11.1 platelets 387.  Assessment and plan.   1 renal insufficiency-her creatinine appears to be slightly above her baseline Will discontinue the Neurontin-also encourage fluids will recheck this later this week clinically she appears to be stable does not appear overtly dry or dehydrated  #2 history of weakness with falls back and hip pain- at this point she appears to be stable wil need PT and OT - she does have tramadol as needed for pain as well as Tylenol extra strength twice a day this will have to be encouraged have discontinued the Neurontin for now.    #3 what appears to be some history of hypertension she is on metoprolol this will have to be watched she has variable blood pressures and I do not see consistent elevations recent ones 100/56-110/58-the highest one I see is 160/74 although this is not frequent   [id number] .  e

## 2014-11-18 ENCOUNTER — Encounter (HOSPITAL_COMMUNITY)
Admission: AD | Admit: 2014-11-18 | Discharge: 2014-11-18 | Disposition: A | Discharge status: Home / Self Care | Payer: Medicare HMO | Source: Skilled Nursing Facility | Attending: Internal Medicine | Admitting: Internal Medicine

## 2014-11-18 LAB — BASIC METABOLIC PANEL
ANION GAP: 9 (ref 5–15)
BUN: 50 mg/dL — ABNORMAL HIGH (ref 6–20)
CHLORIDE: 99 mmol/L — AB (ref 101–111)
CO2: 31 mmol/L (ref 22–32)
Calcium: 9.5 mg/dL (ref 8.9–10.3)
Creatinine, Ser: 1.47 mg/dL — ABNORMAL HIGH (ref 0.44–1.00)
GFR calc non Af Amer: 30 mL/min — ABNORMAL LOW (ref >60)
GFR, EST AFRICAN AMERICAN: 34 mL/min — AB (ref >60)
GLUCOSE: 93 mg/dL (ref 65–99)
Potassium: 4.7 mmol/L (ref 3.5–5.1)
SODIUM: 139 mmol/L (ref 135–145)

## 2014-11-20 ENCOUNTER — Other Ambulatory Visit: Payer: Self-pay | Admitting: *Deleted

## 2014-11-20 MED ORDER — TRAMADOL HCL 50 MG PO TABS: ORAL_TABLET | ORAL | Status: DC

## 2014-11-20 NOTE — Telephone Encounter (Signed)
[facility] 

## 2014-11-22 LAB — BASIC METABOLIC PANEL
ANION GAP: 12 (ref 5–15)
BUN: 34 mg/dL — AB (ref 6–20)
CO2: 28 mmol/L (ref 22–32)
Calcium: 9.3 mg/dL (ref 8.9–10.3)
Chloride: 95 mmol/L — ABNORMAL LOW (ref 101–111)
Creatinine, Ser: 1.37 mg/dL — ABNORMAL HIGH (ref 0.44–1.00)
GFR, EST AFRICAN AMERICAN: 37 mL/min — AB (ref >60)
GFR, EST NON AFRICAN AMERICAN: 32 mL/min — AB (ref >60)
Glucose, Bld: 127 mg/dL — ABNORMAL HIGH (ref 65–99)
Potassium: 4 mmol/L (ref 3.5–5.1)
Sodium: 135 mmol/L (ref 135–145)

## 2014-11-24 ENCOUNTER — Non-Acute Institutional Stay (SKILLED_NURSING_FACILITY): Payer: Medicare HMO | Admitting: Internal Medicine

## 2014-11-24 DIAGNOSIS — D539 Nutritional anemia, unspecified: Secondary | ICD-10-CM | POA: Diagnosis not present

## 2014-11-24 DIAGNOSIS — M1711 Unilateral primary osteoarthritis, right knee: Secondary | ICD-10-CM | POA: Diagnosis not present

## 2014-11-24 DIAGNOSIS — N182 Chronic kidney disease, stage 2 (mild): Secondary | ICD-10-CM

## 2014-11-24 DIAGNOSIS — W19XXXD Unspecified fall, subsequent encounter: Secondary | ICD-10-CM

## 2014-11-24 DIAGNOSIS — I809 Phlebitis and thrombophlebitis of unspecified site: Secondary | ICD-10-CM | POA: Diagnosis not present

## 2014-11-24 DIAGNOSIS — M545 Low back pain: Secondary | ICD-10-CM

## 2014-11-24 NOTE — Progress Notes (Signed)
Patient ID: Kimberly Velazquez, female   DOB: 09-Dec-1919, 81 y.o.   MRN: [id number] Facility; [facility] Chief complaint; review of general medical issues History; this is a very pleasant elderly woman who was admitted to the building after a brief stay at [facility] after suffering a fall at home. She developed intractable low back pain radiating into her groin bilaterally. She lasted several days at home before seeking medical care. X-rays of the lumbar sacral spine, left hip but were negative for fracture. It would appear the patient has done very well she is already ambulating with her walker and asking to be able to go to the bathroom at night independently. She also complains of bilateral lower extremity pain below than the knees and below. She had an external hemorrhoid for which she is receiving Anusol with some improvement.  Review of her lab work shows a creatinine of 1.37 a BUN of 34 sodium of 135 potassium of 4 calcium and 9.3. Hemoglobin of 11.6 with an MCV of 104.3 white count 8.6 with normal differential platelets at 317,000  Lab Results  Component Value Date   CREATININE 1.37* 11/22/2014   CREATININE 1.47* 11/18/2014   CREATININE 1.60* 11/17/2014    CBC Latest Ref Rng 11/17/2014 11/11/2014 11/08/2014  WBC 4.0 - 10.5 K/uL 8.6 8.2 12.6(H)  Hemoglobin 12.0 - 15.0 g/dL 11.6(L) 11.1(L) 12.4  Hematocrit 36.0 - 46.0 % 36.1 34.3(L) 37.9  Platelets 150 - 400 K/uL 317 387 401(H)    Current Outpatient Prescriptions on File Prior to Visit  Medication Sig Dispense Refill  . acetaminophen (TYLENOL) 325 MG tablet Take 650 mg by mouth every 4 (four) hours as needed for mild pain or fever (Do not exceed 3 grams in 24 hours).    Marland Kitchen acetaminophen (TYLENOL) 500 MG tablet Take 500 mg by mouth 2 (two) times daily.    Marland Kitchen aspirin 81 MG tablet Take 81 mg by mouth daily.    . calcium carbonate (OS-CAL) 1250 MG chewable tablet Chew 1 tablet by mouth 2 (two) times daily.     . Calcium-Vitamin D-Vitamin K  (VIACTIV PO) Take by mouth 2 (two) times daily.    Marland Kitchen gabapentin (NEURONTIN) 300 MG capsule Take 1 capsule (300 mg total) by mouth 2 (two) times daily. 60 capsule 2  . Ginkgo Biloba 120 MG CAPS Take 120 mg by mouth daily with breakfast.    . hydrocortisone (ANUSOL-HC) 25 MG suppository Place 25 mg rectally 3 (three) times daily as needed for hemorrhoids or itching.    . levothyroxine (SYNTHROID, LEVOTHROID) 75 MCG tablet Take 75 mcg by mouth daily.      . Multiple Vitamins-Minerals (ONE-A-DAY WOMENS 50 PLUS PO) Take 1 tablet by mouth daily.      . traMADol (ULTRAM) 50 MG tablet Take one tablet by mouth every morning for moderate pain 30 tablet 5   Social; the patient lives in her own home. She has family nearby. Uses a walker. Does not have a history of falling.  Review of systems; Gen.; no weight loss Respiratory; no shortness of breath Cardiac; she does describe episodic palpitations which resolve after 2 minutes of rest. I see that she has been seen for that in the past Abdomen; no abdominal pain no diarrhea, her hemorrhoid feels a lot better GU she describes urinary frequency up 4 times to void at night no dysuria or hematuria and no incontinence. In fact she is asking to walk to the bathroom with her walker Musculoskeletal; describes pain  in both her legs from the knees down. I think her back pain is improved  Physical examination Gen. the patient is bright active and alert Respiratory clear entry bilaterally Cardiac heart sounds are regular no murmurs or gallops she appears to be a euvolemic Abdomen; no liver no spleen no tenderness no masses GU bladder is not enlarged. There is no CVA tenderness. Musculoskeletal; no superficial tenderness in the LS spine or paraspinal muscles. She does have a resolving hematoma on the left hip Extremities; peripheral pulses palpable. This patient has fairly clear superficial phlebitis bilaterally in both legs. She has significant osteoarthritis with  ligamentous instability. There is no effusion in her knees. Gait; she is able to get up and walk with her walker. She is valgus at both knees but her gait looks stable. She turns very easily and safely Mental status bright alert cognizant patient  Impression/plan #1 low back pain likely secondary to a lower back Strain. This seems to be getting better. #2 chronic renal insufficiency. This appears to be stable area probably stage III #3 macrocytic  anemia with an MCV of 104.3 probably worthwhile screening her for B12 deficiency #4 superficial phlebitis. She does not want to try pressure stockings and is not really in pain unless her legs are touched in the right spot #5 significant osteoarthritis of both knees with ligamentous instability. Nevertheless I really think that she appears to be doing well in therapy and no adjustments are planned here. #6 urinary frequency without incontinence. Her post void residual urine is 88 cc; I will child her on an anticholinergic to see if this helps.

## 2014-11-25 LAB — VITAMIN B12: Vitamin B-12: 414 pg/mL (ref 180–914)

## 2014-11-25 LAB — FOLATE: Folate: 59.7 ng/mL (ref >5.9)

## 2014-12-01 NOTE — Progress Notes (Signed)
This encounter was created in error - please disregard.

## 2014-12-09 ENCOUNTER — Encounter: Payer: Self-pay | Admitting: Internal Medicine

## 2014-12-10 ENCOUNTER — Non-Acute Institutional Stay (SKILLED_NURSING_FACILITY): Payer: Medicare HMO | Admitting: Internal Medicine

## 2014-12-10 DIAGNOSIS — D539 Nutritional anemia, unspecified: Secondary | ICD-10-CM

## 2014-12-10 DIAGNOSIS — M545 Low back pain: Secondary | ICD-10-CM

## 2014-12-10 DIAGNOSIS — W19XXXD Unspecified fall, subsequent encounter: Secondary | ICD-10-CM | POA: Diagnosis not present

## 2014-12-11 DIAGNOSIS — M25559 Pain in unspecified hip: Secondary | ICD-10-CM

## 2014-12-11 DIAGNOSIS — G8929 Other chronic pain: Secondary | ICD-10-CM | POA: Insufficient documentation

## 2014-12-16 NOTE — Progress Notes (Signed)
Patient ID: Kimberly Velazquez, female   DOB: January 06, 1920, 81 y.o.   MRN: [id number]                PROGRESS NOTE  DATE:  12/10/2014       FACILITY: [facility]                    LEVEL OF CARE:   SNF   Acute Visit/Discharge Visit   CHIEF COMPLAINT:  Pre-discharge review.     HISTORY OF PRESENT ILLNESS:  This is a lady who came to [place] after suffering a fall at home.    She did not suffer a fracture, but had intractable low back pain and a large hematoma.  She has really done well here and is up mobilizing with a rolling walker.  She is going home to her own home.  However, she has close support of her son and her granddaughter, who live across the street.    I did add Detrol for urinary frequency.  I think this has helped, per the staff.    I did a folate and B12 level on her because her MCVs were macrocytic.  They were both well within the normal range.    She is slightly anemic with a hemoglobin slightly above 11.  One would wonder whether this might represent an early myelodysplastic syndrome with macrocytosis.    CBC Latest Ref Rng 11/17/2014 11/11/2014 11/08/2014  WBC 4.0 - 10.5 K/uL 8.6 8.2 12.6(H)  Hemoglobin 12.0 - 15.0 g/dL 11.6(L) 11.1(L) 12.4  Hematocrit 36.0 - 46.0 % 36.1 34.3(L) 37.9  Platelets 150 - 400 K/uL 317 387 401(H)     Past Medical History  Diagnosis Date  . Thyroid disease    Past Surgical History  Procedure Laterality Date  . Total hip arthroplasty      left  . Abdominal hysterectomy    . Tonsillectomy      Current Outpatient Prescriptions on File Prior to Visit  Medication Sig Dispense Refill  . acetaminophen (TYLENOL) 325 MG tablet Take 650 mg by mouth every 4 (four) hours as needed for mild pain or fever (Do not exceed 3 grams in 24 hours).    Marland Kitchen acetaminophen (TYLENOL) 500 MG tablet Take 500 mg by mouth 2 (two) times daily.    Marland Kitchen aspirin 81 MG tablet Take 81 mg by mouth daily.    . calcium carbonate (OS-CAL) 1250 MG chewable tablet Chew 1  tablet by mouth 2 (two) times daily.     . Calcium-Vitamin D-Vitamin K (VIACTIV PO) Take by mouth 2 (two) times daily.    Marland Kitchen gabapentin (NEURONTIN) 300 MG capsule Take 1 capsule (300 mg total) by mouth 2 (two) times daily. 60 capsule 2  . Ginkgo Biloba 120 MG CAPS Take 120 mg by mouth daily with breakfast.    . hydrocortisone (ANUSOL-HC) 25 MG suppository Place 25 mg rectally 3 (three) times daily as needed for hemorrhoids or itching.    . levothyroxine (SYNTHROID, LEVOTHROID) 75 MCG tablet Take 75 mcg by mouth daily.      . Multiple Vitamins-Minerals (ONE-A-DAY WOMENS 50 PLUS PO) Take 1 tablet by mouth daily.      . traMADol (ULTRAM) 50 MG tablet Take one tablet by mouth every morning for moderate pain 30 tablet 5    PHYSICAL EXAMINATION:   GENERAL APPEARANCE:  The patient does not look to be in any distress.     CHEST/RESPIRATORY:  Clear air entry bilaterally.  CARDIOVASCULAR:   CARDIAC:  Heart sounds are normal.  There are no murmurs.    NEUROLOGICAL:    BALANCE/GAIT:  She is able to bring herself to a standing position.  She walks well with a walker.  Valgus deformity at both knees, but otherwise did well.     IMPRESSION; 1) Fall with resultant low back pain: she appears to be stable in terms of her gait 2)Hematoma: This has resoved. 3) Anemia: This is stable, see discussion above    ASSESSMENT/PLAN:    She will be going home with a rolling walker, home health PT/OT, and I have written her a script for Ultram.     CPT CODE: 16109

## 2015-02-18 DIAGNOSIS — Z13220 Encounter for screening for lipoid disorders: Secondary | ICD-10-CM | POA: Diagnosis not present

## 2015-02-18 DIAGNOSIS — E039 Hypothyroidism, unspecified: Secondary | ICD-10-CM | POA: Diagnosis not present

## 2015-02-18 DIAGNOSIS — R7301 Impaired fasting glucose: Secondary | ICD-10-CM | POA: Diagnosis not present

## 2015-02-20 DIAGNOSIS — R002 Palpitations: Secondary | ICD-10-CM | POA: Diagnosis not present

## 2015-02-20 DIAGNOSIS — E782 Mixed hyperlipidemia: Secondary | ICD-10-CM | POA: Diagnosis not present

## 2015-02-20 DIAGNOSIS — D509 Iron deficiency anemia, unspecified: Secondary | ICD-10-CM | POA: Diagnosis not present

## 2015-02-20 DIAGNOSIS — E039 Hypothyroidism, unspecified: Secondary | ICD-10-CM | POA: Diagnosis not present

## 2015-02-20 DIAGNOSIS — Z23 Encounter for immunization: Secondary | ICD-10-CM | POA: Diagnosis not present

## 2015-02-20 DIAGNOSIS — N183 Chronic kidney disease, stage 3 (moderate): Secondary | ICD-10-CM | POA: Diagnosis not present

## 2015-08-18 DIAGNOSIS — R7301 Impaired fasting glucose: Secondary | ICD-10-CM | POA: Diagnosis not present

## 2015-08-18 DIAGNOSIS — E039 Hypothyroidism, unspecified: Secondary | ICD-10-CM | POA: Diagnosis not present

## 2015-08-18 DIAGNOSIS — I10 Essential (primary) hypertension: Secondary | ICD-10-CM | POA: Diagnosis not present

## 2015-08-20 DIAGNOSIS — N183 Chronic kidney disease, stage 3 (moderate): Secondary | ICD-10-CM | POA: Diagnosis not present

## 2015-08-20 DIAGNOSIS — D509 Iron deficiency anemia, unspecified: Secondary | ICD-10-CM | POA: Diagnosis not present

## 2015-08-20 DIAGNOSIS — E782 Mixed hyperlipidemia: Secondary | ICD-10-CM | POA: Diagnosis not present

## 2015-08-20 DIAGNOSIS — E039 Hypothyroidism, unspecified: Secondary | ICD-10-CM | POA: Diagnosis not present

## 2016-02-16 DIAGNOSIS — E782 Mixed hyperlipidemia: Secondary | ICD-10-CM | POA: Diagnosis not present

## 2016-02-16 DIAGNOSIS — E039 Hypothyroidism, unspecified: Secondary | ICD-10-CM | POA: Diagnosis not present

## 2016-02-18 DIAGNOSIS — D649 Anemia, unspecified: Secondary | ICD-10-CM | POA: Diagnosis not present

## 2016-02-18 DIAGNOSIS — R7301 Impaired fasting glucose: Secondary | ICD-10-CM | POA: Diagnosis not present

## 2016-02-18 DIAGNOSIS — Z23 Encounter for immunization: Secondary | ICD-10-CM | POA: Diagnosis not present

## 2016-02-18 DIAGNOSIS — Z6824 Body mass index (BMI) 24.0-24.9, adult: Secondary | ICD-10-CM | POA: Diagnosis not present

## 2016-02-18 DIAGNOSIS — E782 Mixed hyperlipidemia: Secondary | ICD-10-CM | POA: Diagnosis not present

## 2016-02-18 DIAGNOSIS — M542 Cervicalgia: Secondary | ICD-10-CM | POA: Diagnosis not present

## 2016-02-18 DIAGNOSIS — N183 Chronic kidney disease, stage 3 (moderate): Secondary | ICD-10-CM | POA: Diagnosis not present

## 2016-02-18 DIAGNOSIS — R002 Palpitations: Secondary | ICD-10-CM | POA: Diagnosis not present

## 2016-02-18 DIAGNOSIS — E039 Hypothyroidism, unspecified: Secondary | ICD-10-CM | POA: Diagnosis not present

## 2016-07-19 DIAGNOSIS — K5900 Constipation, unspecified: Secondary | ICD-10-CM | POA: Diagnosis not present

## 2016-07-19 DIAGNOSIS — R350 Frequency of micturition: Secondary | ICD-10-CM | POA: Diagnosis not present

## 2016-07-19 DIAGNOSIS — B3789 Other sites of candidiasis: Secondary | ICD-10-CM | POA: Diagnosis not present

## 2016-07-19 DIAGNOSIS — M542 Cervicalgia: Secondary | ICD-10-CM | POA: Diagnosis not present

## 2016-07-19 DIAGNOSIS — Z6824 Body mass index (BMI) 24.0-24.9, adult: Secondary | ICD-10-CM | POA: Diagnosis not present

## 2016-07-19 DIAGNOSIS — R351 Nocturia: Secondary | ICD-10-CM | POA: Diagnosis not present

## 2016-07-19 DIAGNOSIS — R21 Rash and other nonspecific skin eruption: Secondary | ICD-10-CM | POA: Diagnosis not present

## 2016-08-09 DIAGNOSIS — D509 Iron deficiency anemia, unspecified: Secondary | ICD-10-CM | POA: Diagnosis not present

## 2016-08-09 DIAGNOSIS — E782 Mixed hyperlipidemia: Secondary | ICD-10-CM | POA: Diagnosis not present

## 2016-08-09 DIAGNOSIS — E039 Hypothyroidism, unspecified: Secondary | ICD-10-CM | POA: Diagnosis not present

## 2016-08-09 DIAGNOSIS — R7301 Impaired fasting glucose: Secondary | ICD-10-CM | POA: Diagnosis not present

## 2016-08-12 DIAGNOSIS — E039 Hypothyroidism, unspecified: Secondary | ICD-10-CM | POA: Diagnosis not present

## 2016-08-12 DIAGNOSIS — N183 Chronic kidney disease, stage 3 (moderate): Secondary | ICD-10-CM | POA: Diagnosis not present

## 2016-08-12 DIAGNOSIS — D509 Iron deficiency anemia, unspecified: Secondary | ICD-10-CM | POA: Diagnosis not present

## 2016-12-27 DIAGNOSIS — Z961 Presence of intraocular lens: Secondary | ICD-10-CM | POA: Diagnosis not present

## 2016-12-27 DIAGNOSIS — H524 Presbyopia: Secondary | ICD-10-CM | POA: Diagnosis not present

## 2016-12-27 DIAGNOSIS — H35342 Macular cyst, hole, or pseudohole, left eye: Secondary | ICD-10-CM | POA: Diagnosis not present

## 2017-02-10 DIAGNOSIS — D509 Iron deficiency anemia, unspecified: Secondary | ICD-10-CM | POA: Diagnosis not present

## 2017-02-10 DIAGNOSIS — E782 Mixed hyperlipidemia: Secondary | ICD-10-CM | POA: Diagnosis not present

## 2017-02-10 DIAGNOSIS — E039 Hypothyroidism, unspecified: Secondary | ICD-10-CM | POA: Diagnosis not present

## 2017-02-10 DIAGNOSIS — R7301 Impaired fasting glucose: Secondary | ICD-10-CM | POA: Diagnosis not present

## 2017-02-13 DIAGNOSIS — R002 Palpitations: Secondary | ICD-10-CM | POA: Diagnosis not present

## 2017-02-13 DIAGNOSIS — R7301 Impaired fasting glucose: Secondary | ICD-10-CM | POA: Diagnosis not present

## 2017-02-13 DIAGNOSIS — N183 Chronic kidney disease, stage 3 (moderate): Secondary | ICD-10-CM | POA: Diagnosis not present

## 2017-02-13 DIAGNOSIS — E039 Hypothyroidism, unspecified: Secondary | ICD-10-CM | POA: Diagnosis not present

## 2017-02-13 DIAGNOSIS — M542 Cervicalgia: Secondary | ICD-10-CM | POA: Diagnosis not present

## 2017-02-13 DIAGNOSIS — E782 Mixed hyperlipidemia: Secondary | ICD-10-CM | POA: Diagnosis not present

## 2017-02-13 DIAGNOSIS — D509 Iron deficiency anemia, unspecified: Secondary | ICD-10-CM | POA: Diagnosis not present

## 2017-02-13 DIAGNOSIS — N3281 Overactive bladder: Secondary | ICD-10-CM | POA: Diagnosis not present

## 2017-02-13 DIAGNOSIS — K5900 Constipation, unspecified: Secondary | ICD-10-CM | POA: Diagnosis not present

## 2017-02-13 DIAGNOSIS — M25569 Pain in unspecified knee: Secondary | ICD-10-CM | POA: Diagnosis not present

## 2017-08-21 DIAGNOSIS — R002 Palpitations: Secondary | ICD-10-CM | POA: Diagnosis not present

## 2017-08-21 DIAGNOSIS — E039 Hypothyroidism, unspecified: Secondary | ICD-10-CM | POA: Diagnosis not present

## 2017-08-21 DIAGNOSIS — N3281 Overactive bladder: Secondary | ICD-10-CM | POA: Diagnosis not present

## 2017-08-21 DIAGNOSIS — R7301 Impaired fasting glucose: Secondary | ICD-10-CM | POA: Diagnosis not present

## 2017-08-21 DIAGNOSIS — N183 Chronic kidney disease, stage 3 (moderate): Secondary | ICD-10-CM | POA: Diagnosis not present

## 2017-08-21 DIAGNOSIS — D509 Iron deficiency anemia, unspecified: Secondary | ICD-10-CM | POA: Diagnosis not present

## 2017-08-21 DIAGNOSIS — E782 Mixed hyperlipidemia: Secondary | ICD-10-CM | POA: Diagnosis not present

## 2017-08-21 DIAGNOSIS — I10 Essential (primary) hypertension: Secondary | ICD-10-CM | POA: Diagnosis not present

## 2017-08-23 DIAGNOSIS — D649 Anemia, unspecified: Secondary | ICD-10-CM | POA: Diagnosis not present

## 2017-08-23 DIAGNOSIS — N183 Chronic kidney disease, stage 3 (moderate): Secondary | ICD-10-CM | POA: Diagnosis not present

## 2017-08-23 DIAGNOSIS — K5900 Constipation, unspecified: Secondary | ICD-10-CM | POA: Diagnosis not present

## 2017-08-23 DIAGNOSIS — R7301 Impaired fasting glucose: Secondary | ICD-10-CM | POA: Diagnosis not present

## 2017-08-23 DIAGNOSIS — R002 Palpitations: Secondary | ICD-10-CM | POA: Diagnosis not present

## 2017-08-23 DIAGNOSIS — N3281 Overactive bladder: Secondary | ICD-10-CM | POA: Diagnosis not present

## 2017-08-23 DIAGNOSIS — M542 Cervicalgia: Secondary | ICD-10-CM | POA: Diagnosis not present

## 2017-08-23 DIAGNOSIS — M25569 Pain in unspecified knee: Secondary | ICD-10-CM | POA: Diagnosis not present

## 2017-08-23 DIAGNOSIS — E782 Mixed hyperlipidemia: Secondary | ICD-10-CM | POA: Diagnosis not present

## 2017-08-23 DIAGNOSIS — E039 Hypothyroidism, unspecified: Secondary | ICD-10-CM | POA: Diagnosis not present

## 2017-12-14 DIAGNOSIS — D509 Iron deficiency anemia, unspecified: Secondary | ICD-10-CM | POA: Diagnosis not present

## 2017-12-14 DIAGNOSIS — E782 Mixed hyperlipidemia: Secondary | ICD-10-CM | POA: Diagnosis not present

## 2017-12-14 DIAGNOSIS — R7301 Impaired fasting glucose: Secondary | ICD-10-CM | POA: Diagnosis not present

## 2017-12-14 DIAGNOSIS — E039 Hypothyroidism, unspecified: Secondary | ICD-10-CM | POA: Diagnosis not present

## 2017-12-14 DIAGNOSIS — N183 Chronic kidney disease, stage 3 (moderate): Secondary | ICD-10-CM | POA: Diagnosis not present

## 2018-02-21 DIAGNOSIS — D649 Anemia, unspecified: Secondary | ICD-10-CM | POA: Diagnosis not present

## 2018-02-21 DIAGNOSIS — D509 Iron deficiency anemia, unspecified: Secondary | ICD-10-CM | POA: Diagnosis not present

## 2018-02-21 DIAGNOSIS — E782 Mixed hyperlipidemia: Secondary | ICD-10-CM | POA: Diagnosis not present

## 2018-02-21 DIAGNOSIS — R7301 Impaired fasting glucose: Secondary | ICD-10-CM | POA: Diagnosis not present

## 2018-02-21 DIAGNOSIS — E039 Hypothyroidism, unspecified: Secondary | ICD-10-CM | POA: Diagnosis not present

## 2018-02-27 DIAGNOSIS — E039 Hypothyroidism, unspecified: Secondary | ICD-10-CM | POA: Diagnosis not present

## 2018-02-27 DIAGNOSIS — R7301 Impaired fasting glucose: Secondary | ICD-10-CM | POA: Diagnosis not present

## 2018-02-27 DIAGNOSIS — D509 Iron deficiency anemia, unspecified: Secondary | ICD-10-CM | POA: Diagnosis not present

## 2018-02-27 DIAGNOSIS — Z23 Encounter for immunization: Secondary | ICD-10-CM | POA: Diagnosis not present

## 2018-02-27 DIAGNOSIS — K5900 Constipation, unspecified: Secondary | ICD-10-CM | POA: Diagnosis not present

## 2018-02-27 DIAGNOSIS — M542 Cervicalgia: Secondary | ICD-10-CM | POA: Diagnosis not present

## 2018-02-27 DIAGNOSIS — N183 Chronic kidney disease, stage 3 (moderate): Secondary | ICD-10-CM | POA: Diagnosis not present

## 2018-02-27 DIAGNOSIS — E782 Mixed hyperlipidemia: Secondary | ICD-10-CM | POA: Diagnosis not present

## 2018-02-27 DIAGNOSIS — N3281 Overactive bladder: Secondary | ICD-10-CM | POA: Diagnosis not present

## 2018-02-27 DIAGNOSIS — R002 Palpitations: Secondary | ICD-10-CM | POA: Diagnosis not present

## 2018-03-21 DIAGNOSIS — Z0000 Encounter for general adult medical examination without abnormal findings: Secondary | ICD-10-CM | POA: Diagnosis not present

## 2018-07-09 DIAGNOSIS — M4100 Infantile idiopathic scoliosis, site unspecified: Secondary | ICD-10-CM | POA: Diagnosis not present

## 2018-07-09 DIAGNOSIS — M170 Bilateral primary osteoarthritis of knee: Secondary | ICD-10-CM | POA: Diagnosis not present

## 2018-07-09 DIAGNOSIS — M25569 Pain in unspecified knee: Secondary | ICD-10-CM | POA: Diagnosis not present

## 2018-08-02 DIAGNOSIS — M12569 Traumatic arthropathy, unspecified knee: Secondary | ICD-10-CM | POA: Diagnosis not present

## 2018-08-02 DIAGNOSIS — R569 Unspecified convulsions: Secondary | ICD-10-CM | POA: Diagnosis not present

## 2018-08-09 DIAGNOSIS — M25569 Pain in unspecified knee: Secondary | ICD-10-CM | POA: Diagnosis not present

## 2018-08-09 DIAGNOSIS — M4100 Infantile idiopathic scoliosis, site unspecified: Secondary | ICD-10-CM | POA: Diagnosis not present

## 2018-08-09 DIAGNOSIS — M170 Bilateral primary osteoarthritis of knee: Secondary | ICD-10-CM | POA: Diagnosis not present

## 2018-08-21 DIAGNOSIS — K5900 Constipation, unspecified: Secondary | ICD-10-CM | POA: Diagnosis not present

## 2018-08-21 DIAGNOSIS — N183 Chronic kidney disease, stage 3 (moderate): Secondary | ICD-10-CM | POA: Diagnosis not present

## 2018-08-21 DIAGNOSIS — D649 Anemia, unspecified: Secondary | ICD-10-CM | POA: Diagnosis not present

## 2018-08-21 DIAGNOSIS — E782 Mixed hyperlipidemia: Secondary | ICD-10-CM | POA: Diagnosis not present

## 2018-08-21 DIAGNOSIS — E039 Hypothyroidism, unspecified: Secondary | ICD-10-CM | POA: Diagnosis not present

## 2018-08-21 DIAGNOSIS — M25569 Pain in unspecified knee: Secondary | ICD-10-CM | POA: Diagnosis not present

## 2018-08-21 DIAGNOSIS — R7301 Impaired fasting glucose: Secondary | ICD-10-CM | POA: Diagnosis not present

## 2018-08-21 DIAGNOSIS — R002 Palpitations: Secondary | ICD-10-CM | POA: Diagnosis not present

## 2018-08-21 DIAGNOSIS — N3281 Overactive bladder: Secondary | ICD-10-CM | POA: Diagnosis not present

## 2018-08-21 DIAGNOSIS — M179 Osteoarthritis of knee, unspecified: Secondary | ICD-10-CM | POA: Diagnosis not present

## 2018-09-09 DIAGNOSIS — M4100 Infantile idiopathic scoliosis, site unspecified: Secondary | ICD-10-CM | POA: Diagnosis not present

## 2018-09-09 DIAGNOSIS — M170 Bilateral primary osteoarthritis of knee: Secondary | ICD-10-CM | POA: Diagnosis not present

## 2018-09-09 DIAGNOSIS — M25569 Pain in unspecified knee: Secondary | ICD-10-CM | POA: Diagnosis not present

## 2018-09-22 DIAGNOSIS — Z0000 Encounter for general adult medical examination without abnormal findings: Secondary | ICD-10-CM | POA: Diagnosis not present

## 2018-10-09 DIAGNOSIS — M170 Bilateral primary osteoarthritis of knee: Secondary | ICD-10-CM | POA: Diagnosis not present

## 2018-10-09 DIAGNOSIS — M25569 Pain in unspecified knee: Secondary | ICD-10-CM | POA: Diagnosis not present

## 2018-10-09 DIAGNOSIS — M4100 Infantile idiopathic scoliosis, site unspecified: Secondary | ICD-10-CM | POA: Diagnosis not present

## 2018-11-07 DIAGNOSIS — E039 Hypothyroidism, unspecified: Secondary | ICD-10-CM | POA: Diagnosis not present

## 2018-11-07 DIAGNOSIS — M79671 Pain in right foot: Secondary | ICD-10-CM | POA: Diagnosis not present

## 2018-11-07 DIAGNOSIS — N3281 Overactive bladder: Secondary | ICD-10-CM | POA: Diagnosis not present

## 2018-11-07 DIAGNOSIS — M170 Bilateral primary osteoarthritis of knee: Secondary | ICD-10-CM | POA: Diagnosis not present

## 2018-11-09 DIAGNOSIS — M4100 Infantile idiopathic scoliosis, site unspecified: Secondary | ICD-10-CM | POA: Diagnosis not present

## 2018-11-09 DIAGNOSIS — M170 Bilateral primary osteoarthritis of knee: Secondary | ICD-10-CM | POA: Diagnosis not present

## 2018-11-09 DIAGNOSIS — M25569 Pain in unspecified knee: Secondary | ICD-10-CM | POA: Diagnosis not present

## 2018-12-10 DIAGNOSIS — M170 Bilateral primary osteoarthritis of knee: Secondary | ICD-10-CM | POA: Diagnosis not present

## 2018-12-10 DIAGNOSIS — M25569 Pain in unspecified knee: Secondary | ICD-10-CM | POA: Diagnosis not present

## 2018-12-10 DIAGNOSIS — M4100 Infantile idiopathic scoliosis, site unspecified: Secondary | ICD-10-CM | POA: Diagnosis not present

## 2019-01-02 DIAGNOSIS — M79671 Pain in right foot: Secondary | ICD-10-CM | POA: Diagnosis not present

## 2019-01-02 DIAGNOSIS — M170 Bilateral primary osteoarthritis of knee: Secondary | ICD-10-CM | POA: Diagnosis not present

## 2019-01-02 DIAGNOSIS — N3281 Overactive bladder: Secondary | ICD-10-CM | POA: Diagnosis not present

## 2019-01-02 DIAGNOSIS — E039 Hypothyroidism, unspecified: Secondary | ICD-10-CM | POA: Diagnosis not present

## 2019-01-09 DIAGNOSIS — M4100 Infantile idiopathic scoliosis, site unspecified: Secondary | ICD-10-CM | POA: Diagnosis not present

## 2019-01-09 DIAGNOSIS — M170 Bilateral primary osteoarthritis of knee: Secondary | ICD-10-CM | POA: Diagnosis not present

## 2019-01-09 DIAGNOSIS — M25569 Pain in unspecified knee: Secondary | ICD-10-CM | POA: Diagnosis not present

## 2019-02-08 DIAGNOSIS — W19XXXA Unspecified fall, initial encounter: Secondary | ICD-10-CM | POA: Diagnosis not present

## 2019-02-08 DIAGNOSIS — R609 Edema, unspecified: Secondary | ICD-10-CM | POA: Diagnosis not present

## 2019-02-09 DIAGNOSIS — M25569 Pain in unspecified knee: Secondary | ICD-10-CM | POA: Diagnosis not present

## 2019-02-09 DIAGNOSIS — M170 Bilateral primary osteoarthritis of knee: Secondary | ICD-10-CM | POA: Diagnosis not present

## 2019-02-09 DIAGNOSIS — M4100 Infantile idiopathic scoliosis, site unspecified: Secondary | ICD-10-CM | POA: Diagnosis not present

## 2019-03-11 DIAGNOSIS — M4100 Infantile idiopathic scoliosis, site unspecified: Secondary | ICD-10-CM | POA: Diagnosis not present

## 2019-03-11 DIAGNOSIS — M25569 Pain in unspecified knee: Secondary | ICD-10-CM | POA: Diagnosis not present

## 2019-03-11 DIAGNOSIS — M170 Bilateral primary osteoarthritis of knee: Secondary | ICD-10-CM | POA: Diagnosis not present

## 2019-04-11 DIAGNOSIS — M4100 Infantile idiopathic scoliosis, site unspecified: Secondary | ICD-10-CM | POA: Diagnosis not present

## 2019-04-11 DIAGNOSIS — M25569 Pain in unspecified knee: Secondary | ICD-10-CM | POA: Diagnosis not present

## 2019-04-11 DIAGNOSIS — M170 Bilateral primary osteoarthritis of knee: Secondary | ICD-10-CM | POA: Diagnosis not present

## 2019-04-19 DIAGNOSIS — N3281 Overactive bladder: Secondary | ICD-10-CM | POA: Diagnosis not present

## 2019-04-19 DIAGNOSIS — N183 Chronic kidney disease, stage 3 unspecified: Secondary | ICD-10-CM | POA: Diagnosis not present

## 2019-04-19 DIAGNOSIS — M79671 Pain in right foot: Secondary | ICD-10-CM | POA: Diagnosis not present

## 2019-04-19 DIAGNOSIS — E039 Hypothyroidism, unspecified: Secondary | ICD-10-CM | POA: Diagnosis not present

## 2019-04-19 DIAGNOSIS — E7849 Other hyperlipidemia: Secondary | ICD-10-CM | POA: Diagnosis not present

## 2019-04-19 DIAGNOSIS — M170 Bilateral primary osteoarthritis of knee: Secondary | ICD-10-CM | POA: Diagnosis not present

## 2019-05-12 DIAGNOSIS — M170 Bilateral primary osteoarthritis of knee: Secondary | ICD-10-CM | POA: Diagnosis not present

## 2019-05-12 DIAGNOSIS — M4100 Infantile idiopathic scoliosis, site unspecified: Secondary | ICD-10-CM | POA: Diagnosis not present

## 2019-05-12 DIAGNOSIS — M25569 Pain in unspecified knee: Secondary | ICD-10-CM | POA: Diagnosis not present

## 2019-06-03 DIAGNOSIS — E782 Mixed hyperlipidemia: Secondary | ICD-10-CM | POA: Diagnosis not present

## 2019-06-03 DIAGNOSIS — N183 Chronic kidney disease, stage 3 unspecified: Secondary | ICD-10-CM | POA: Diagnosis not present

## 2019-06-03 DIAGNOSIS — R7301 Impaired fasting glucose: Secondary | ICD-10-CM | POA: Diagnosis not present

## 2019-06-03 DIAGNOSIS — D509 Iron deficiency anemia, unspecified: Secondary | ICD-10-CM | POA: Diagnosis not present

## 2019-06-03 DIAGNOSIS — E7849 Other hyperlipidemia: Secondary | ICD-10-CM | POA: Diagnosis not present

## 2019-06-03 DIAGNOSIS — E039 Hypothyroidism, unspecified: Secondary | ICD-10-CM | POA: Diagnosis not present

## 2019-06-05 DIAGNOSIS — R251 Tremor, unspecified: Secondary | ICD-10-CM | POA: Diagnosis not present

## 2019-06-05 DIAGNOSIS — R569 Unspecified convulsions: Secondary | ICD-10-CM | POA: Diagnosis not present

## 2019-06-05 DIAGNOSIS — R7301 Impaired fasting glucose: Secondary | ICD-10-CM | POA: Diagnosis not present

## 2019-06-05 DIAGNOSIS — D649 Anemia, unspecified: Secondary | ICD-10-CM | POA: Diagnosis not present

## 2019-06-05 DIAGNOSIS — N3281 Overactive bladder: Secondary | ICD-10-CM | POA: Diagnosis not present

## 2019-06-05 DIAGNOSIS — M542 Cervicalgia: Secondary | ICD-10-CM | POA: Diagnosis not present

## 2019-06-05 DIAGNOSIS — M79671 Pain in right foot: Secondary | ICD-10-CM | POA: Diagnosis not present

## 2019-06-05 DIAGNOSIS — M170 Bilateral primary osteoarthritis of knee: Secondary | ICD-10-CM | POA: Diagnosis not present

## 2019-06-05 DIAGNOSIS — R002 Palpitations: Secondary | ICD-10-CM | POA: Diagnosis not present

## 2019-06-05 DIAGNOSIS — K5900 Constipation, unspecified: Secondary | ICD-10-CM | POA: Diagnosis not present

## 2019-06-07 ENCOUNTER — Inpatient Hospital Stay
Admission: RE | Admit: 2019-06-07 | Discharge: 2019-07-04 | Disposition: A | Discharge status: Skilled Nursing Facility | Payer: Medicare HMO | Source: Ambulatory Visit | Attending: Internal Medicine | Admitting: Internal Medicine

## 2019-06-09 DIAGNOSIS — M170 Bilateral primary osteoarthritis of knee: Secondary | ICD-10-CM | POA: Diagnosis not present

## 2019-06-09 DIAGNOSIS — M25569 Pain in unspecified knee: Secondary | ICD-10-CM | POA: Diagnosis not present

## 2019-06-09 DIAGNOSIS — M4100 Infantile idiopathic scoliosis, site unspecified: Secondary | ICD-10-CM | POA: Diagnosis not present

## 2019-06-10 ENCOUNTER — Non-Acute Institutional Stay (SKILLED_NURSING_FACILITY): Payer: Medicare HMO | Admitting: Adult Health

## 2019-06-10 ENCOUNTER — Encounter: Payer: Self-pay | Admitting: Adult Health

## 2019-06-10 ENCOUNTER — Encounter (HOSPITAL_COMMUNITY)
Admission: RE | Admit: 2019-06-10 | Discharge: 2019-06-10 | Disposition: A | Discharge status: Home / Self Care | Payer: Medicare HMO | Source: Skilled Nursing Facility | Attending: Adult Health | Admitting: Adult Health

## 2019-06-10 DIAGNOSIS — R627 Adult failure to thrive: Secondary | ICD-10-CM

## 2019-06-10 DIAGNOSIS — E876 Hypokalemia: Secondary | ICD-10-CM

## 2019-06-10 DIAGNOSIS — N182 Chronic kidney disease, stage 2 (mild): Secondary | ICD-10-CM | POA: Diagnosis not present

## 2019-06-10 DIAGNOSIS — K5909 Other constipation: Secondary | ICD-10-CM | POA: Insufficient documentation

## 2019-06-10 DIAGNOSIS — R32 Unspecified urinary incontinence: Secondary | ICD-10-CM | POA: Diagnosis not present

## 2019-06-10 DIAGNOSIS — E039 Hypothyroidism, unspecified: Secondary | ICD-10-CM | POA: Insufficient documentation

## 2019-06-10 LAB — CBC
HCT: 37.2 % (ref 36.0–46.0)
Hemoglobin: 11.6 g/dL — ABNORMAL LOW (ref 12.0–15.0)
MCH: 32 pg (ref 26.0–34.0)
MCHC: 31.2 g/dL (ref 30.0–36.0)
MCV: 102.8 fL — ABNORMAL HIGH (ref 80.0–100.0)
Platelets: 237 10*3/uL (ref 150–400)
RBC: 3.62 MIL/uL — ABNORMAL LOW (ref 3.87–5.11)
RDW: 14.5 % (ref 11.5–15.5)
WBC: 5.9 10*3/uL (ref 4.0–10.5)
nRBC: 0 % (ref 0.0–0.2)

## 2019-06-10 LAB — COMPREHENSIVE METABOLIC PANEL
ALT: 13 U/L (ref 0–44)
AST: 19 U/L (ref 15–41)
Albumin: 3.5 g/dL (ref 3.5–5.0)
Alkaline Phosphatase: 72 U/L (ref 38–126)
Anion gap: 10 (ref 5–15)
BUN: 50 mg/dL — ABNORMAL HIGH (ref 8–23)
CO2: 28 mmol/L (ref 22–32)
Calcium: 9.1 mg/dL (ref 8.9–10.3)
Chloride: 103 mmol/L (ref 98–111)
Creatinine, Ser: 1.05 mg/dL — ABNORMAL HIGH (ref 0.44–1.00)
GFR calc Af Amer: 51 mL/min — ABNORMAL LOW (ref >60)
GFR calc non Af Amer: 44 mL/min — ABNORMAL LOW (ref >60)
Glucose, Bld: 91 mg/dL (ref 70–99)
Potassium: 4.4 mmol/L (ref 3.5–5.1)
Sodium: 141 mmol/L (ref 135–145)
Total Bilirubin: 0.8 mg/dL (ref 0.3–1.2)
Total Protein: 7 g/dL (ref 6.5–8.1)

## 2019-06-10 LAB — TSH: TSH: 13.006 u[IU]/mL — ABNORMAL HIGH (ref 0.350–4.500)

## 2019-06-10 NOTE — Progress Notes (Signed)
Location:    [facility] Room Number: 154/W Place of Service:  SNF (31)   CODE STATUS: Full Code  Allergies  Allergen Reactions  . Cephalexin Other (See Comments)    Pt. Doesn't remember.   . Cephalexin Other (See Comments)    Pt. Doesn't remember.   . Penicillins Other (See Comments)    Pt. Cant remember.     Chief Complaint  Patient presents with  . Hospitalization Follow-up    Hospitalization Follow Up    HPI:  She is being transferred from her home. She has had several falls at home and can no longer maintain her adls at home. At this time she is here for short term rehab. She does complain of fatigue. She is afraid of falling. Denies any uncontrolled pain; no insomnia will continue to be followed for her chronic illnesses including: hypothyroidism; UI: hypokalemia.   Past Medical History:  Diagnosis Date  . Thyroid disease     Past Surgical History:  Procedure Laterality Date  . ABDOMINAL HYSTERECTOMY    . TONSILLECTOMY    . TOTAL HIP ARTHROPLASTY     left    Social History   Socioeconomic History  . Marital status: Widowed    Spouse name: Not on file  . Number of children: Not on file  . Years of education: Not on file  . Highest education level: Not on file  Occupational History  . Not on file  Tobacco Use  . Smoking status: Never Smoker  . Smokeless tobacco: Never Used  Substance and Sexual Activity  . Alcohol use: No  . Drug use: No  . Sexual activity: Not Currently  Other Topics Concern  . Not on file  Social History Narrative  . Not on file   Social Determinants of Health   Financial Resource Strain:   . Difficulty of Paying Living Expenses: Not on file  Food Insecurity:   . Worried About Programme researcher, broadcasting/film/video in the Last Year: Not on file  . Ran Out of Food in the Last Year: Not on file  Transportation Needs:   . Lack of Transportation (Medical): Not on file  . Lack of Transportation (Non-Medical): Not on file    Physical Activity:   . Days of Exercise per Week: Not on file  . Minutes of Exercise per Session: Not on file  Stress:   . Feeling of Stress : Not on file  Social Connections:   . Frequency of Communication with Friends and Family: Not on file  . Frequency of Social Gatherings with Friends and Family: Not on file  . Attends Religious Services: Not on file  . Active Member of Clubs or Organizations: Not on file  . Attends Banker Meetings: Not on file  . Marital Status: Not on file  Intimate Partner Violence:   . Fear of Current or Ex-Partner: Not on file  . Emotionally Abused: Not on file  . Physically Abused: Not on file  . Sexually Abused: Not on file   Family History  Problem Relation Age of Onset  . Heart attack Mother   . Heart attack Father       VITAL SIGNS BP (!) 147/63   Pulse 75   Temp (!) 97 F (36.1 C) (Oral)   Resp 20   Ht 5\' 1"  (1.549 m)   Wt 145 lb 12.8 oz (66.1 kg)   SpO2 90%   BMI 27.55 kg/m   Outpatient Encounter Medications  as of 06/10/2019  Medication Sig  . acetaminophen (TYLENOL) 500 MG tablet 1st Order:  oral Special Instructions: As needed for pain. Max 3gm acetaminophen/24 hrs from all sources. Every 8 Hours - PRN PRN 1, PRN 2, PRN 3   2nd Order:  amt: 2 tabs (1000mg  total); oral Special Instructions: As needed for pain. Max 3gm acetaminophen/24 hrs from all sources. Every 8 Hours - PRN PRN 1, PRN 2, PRN 3  . docusate sodium (COLACE) 100 MG capsule Take 100 mg by mouth 2 (two) times daily.  . furosemide (LASIX) 20 MG tablet Take 20 mg by mouth daily as needed. As needed for weight gain greater than 3 pounds in one day. Notify provider if given.  glucosamine-chondroitin 500-400 MG tablet Take 1 tablet by mouth daily.  Marland Kitchen ibuprofen (ADVIL) 400 MG tablet Take 400 mg by mouth every 4 (four) hours as needed (For Pain).  Marland Kitchen levothyroxine (SYNTHROID, LEVOTHROID) 75 MCG tablet Take 75 mcg by mouth daily.    . mirabegron ER (MYRBETRIQ) 50  MG TB24 tablet Take 50 mg by mouth at bedtime.  . NON FORMULARY Diet: _____ Regular,  ___x___ NAS,  _______Consistent Carbohydrate,  _______NPO  _____Other  . potassium chloride (KLOR-CON) 10 MEQ tablet Take 10 mEq by mouth daily. Do not crush. Give with food and full glass of liquid.   No facility-administered encounter medications on file as of 06/10/2019.     SIGNIFICANT DIAGNOSTIC EXAMS  LABS REVIEWED TODAY  06-10-19: wbc 5.9; hgb 11.6; hct 37.2; mcv 102.8 plt 237 glucose 91; bun 50; creat 1.05 ;k+ 4.4; na++ 141; ca 9.1 liver normal albumin 3.5    Review of Systems  Constitutional: Positive for malaise/fatigue.  Respiratory: Negative for cough and shortness of breath.   Cardiovascular: Negative for chest pain, palpitations and leg swelling.  Gastrointestinal: Negative for abdominal pain, constipation and heartburn.  Musculoskeletal: Negative for back pain, joint pain and myalgias.  Skin: Negative.   Neurological: Negative for dizziness.  Psychiatric/Behavioral: The patient is not nervous/anxious.     Physical Exam Constitutional:      General: She is not in acute distress.    Appearance: She is well-developed. She is not diaphoretic.  Neck:     Thyroid: No thyromegaly.  Cardiovascular:     Rate and Rhythm: Normal rate and regular rhythm.     Heart sounds: Normal heart sounds.     Comments: PP very faint  Pulmonary:     Effort: Pulmonary effort is normal. No respiratory distress.     Breath sounds: Normal breath sounds.  Abdominal:     General: Bowel sounds are normal. There is no distension.     Palpations: Abdomen is soft.     Tenderness: There is no abdominal tenderness.  Musculoskeletal:     Cervical back: Neck supple.     Right lower leg: No edema.     Left lower leg: No edema.     Comments: Is able to move all extremities Feet are stiff and sensitive to touch  Lymphadenopathy:     Cervical: No cervical adenopathy.  Skin:    General: Skin is warm and dry.       Comments: Bilateral lower extremities discolored   Neurological:     Mental Status: She is alert and oriented to person, place, and time.  Psychiatric:        Mood and Affect: Mood normal.        ASSESSMENT/ PLAN:  TODAY  1. Acquired hypothyroidism: is  worse: tsh 13.006 will increase synthroid to 100 mcg daily and will check tsh on 07-26-19  2. CKD (chronic kidney disease) stage 2: is stable bun 50; creat 1.05 will monitor  3. Urinary incontinence in female is stable will continue mybretriq 50 mg daily  4. Chronic constipation is stable: will continue colace twice daily   5. Hypokalemia: is stable k+ 4.4 will continue k+ 10 meq daily   6. Failure to thrive in adult: is stable weight is 145 pounds will monitor her status will give supplements as directed; albumin 3.5     MD is aware of resident's narcotic use and is in agreement with current plan of care. We will attempt to wean resident as appropriate.  Ok Edwards NP [facility]  Contact [phone] Monday through Friday 8am- 5pm  After hours call [phone]

## 2019-06-11 ENCOUNTER — Encounter: Payer: Self-pay | Admitting: Adult Health

## 2019-06-11 ENCOUNTER — Non-Acute Institutional Stay (SKILLED_NURSING_FACILITY): Payer: Medicare HMO | Admitting: Adult Health

## 2019-06-11 DIAGNOSIS — R627 Adult failure to thrive: Secondary | ICD-10-CM

## 2019-06-11 DIAGNOSIS — N182 Chronic kidney disease, stage 2 (mild): Secondary | ICD-10-CM | POA: Diagnosis not present

## 2019-06-11 DIAGNOSIS — E876 Hypokalemia: Secondary | ICD-10-CM

## 2019-06-11 DIAGNOSIS — K5909 Other constipation: Secondary | ICD-10-CM | POA: Insufficient documentation

## 2019-06-11 DIAGNOSIS — R32 Unspecified urinary incontinence: Secondary | ICD-10-CM | POA: Insufficient documentation

## 2019-06-11 DIAGNOSIS — E039 Hypothyroidism, unspecified: Secondary | ICD-10-CM

## 2019-06-11 HISTORY — DX: Hypokalemia: E87.6

## 2019-06-11 NOTE — Progress Notes (Signed)
Location:    [facility] Room Number: 154/W Place of Service:  SNF (31)    CODE STATUS: Full Code  Allergies  Allergen Reactions  . Cephalexin Other (See Comments)    Pt. Doesn't remember.   . Cephalexin Other (See Comments)    Pt. Doesn't remember.   . Penicillins Other (See Comments)    Pt. Cant remember.     Chief Complaint  Patient presents with  . Acute Visit    72-hour Care Plan    HPI:  We have come together for her 72 hour meeting. She is at this facility after her admission from home more than likely this does represent a long term placement for her in either assisted living or SNF. There are no reports of uncontrolled pain; no reports of poor appetite; no reports of anxiety or agitation. She continues to be followed for her chronic illnesses including: hypothyroidism failure to thrive ckd.    Past Medical History:  Diagnosis Date  . Thyroid disease     Past Surgical History:  Procedure Laterality Date  . ABDOMINAL HYSTERECTOMY    . TONSILLECTOMY    . TOTAL HIP ARTHROPLASTY     left    Social History   Socioeconomic History  . Marital status: Widowed    Spouse name: Not on file  . Number of children: Not on file  . Years of education: Not on file  . Highest education level: Not on file  Occupational History  . Not on file  Tobacco Use  . Smoking status: Never Smoker  . Smokeless tobacco: Never Used  Substance and Sexual Activity  . Alcohol use: No  . Drug use: No  . Sexual activity: Not Currently  Other Topics Concern  . Not on file  Social History Narrative  . Not on file   Social Determinants of Health   Financial Resource Strain:   . Difficulty of Paying Living Expenses: Not on file  Food Insecurity:   . Worried About Programme researcher, broadcasting/film/video in the Last Year: Not on file  . Ran Out of Food in the Last Year: Not on file  Transportation Needs:   . Lack of Transportation (Medical): Not on file  . Lack of  Transportation (Non-Medical): Not on file  Physical Activity:   . Days of Exercise per Week: Not on file  . Minutes of Exercise per Session: Not on file  Stress:   . Feeling of Stress : Not on file  Social Connections:   . Frequency of Communication with Friends and Family: Not on file  . Frequency of Social Gatherings with Friends and Family: Not on file  . Attends Religious Services: Not on file  . Active Member of Clubs or Organizations: Not on file  . Attends Banker Meetings: Not on file  . Marital Status: Not on file  Intimate Partner Violence:   . Fear of Current or Ex-Partner: Not on file  . Emotionally Abused: Not on file  . Physically Abused: Not on file  . Sexually Abused: Not on file   Family History  Problem Relation Age of Onset  . Heart attack Mother   . Heart attack Father     VITAL SIGNS BP (!) 144/71   Pulse 62   Temp 98.1 F (36.7 C) (Oral)   Resp 20   Ht 5\' 1"  (1.549 m)   Wt 143 lb 3.2 oz (65 kg)   SpO2 90%   BMI 27.06  kg/m   Patient's Medications  New Prescriptions   No medications on file  Previous Medications   ACETAMINOPHEN (TYLENOL) 500 MG TABLET    1st Order:  oral Special Instructions: As needed for pain. Max 3gm acetaminophen/24 hrs from all sources. Every 8 Hours - PRN PRN 1, PRN 2, PRN 3   2nd Order:  amt: 2 tabs (1000mg  total); oral Special Instructions: As needed for pain. Max 3gm acetaminophen/24 hrs from all sources. Every 8 Hours - PRN PRN 1, PRN 2, PRN 3   DICLOFENAC SODIUM (VOLTAREN) 1 % GEL    Apply 2 g topically every 6 (six) hours as needed. Apply topically to bilateral knees as needed for arthritic pain.   DOCUSATE SODIUM (COLACE) 100 MG CAPSULE    Take 100 mg by mouth 2 (two) times daily.   FEEDING SUPPLEMENT, ENSURE ENLIVE, (ENSURE ENLIVE) LIQD    Take 237 mLs by mouth daily.   FUROSEMIDE (LASIX) 20 MG TABLET    Take 20 mg by mouth daily as needed. As needed for weight gain greater than 3 pounds in one day.  Notify provider if given.   GLUCOSAMINE-CHONDROITIN 500-400 MG TABLET    Take 1 tablet by mouth daily.   IBUPROFEN (ADVIL) 400 MG TABLET    Take 400 mg by mouth every 4 (four) hours as needed (For Pain).   LEVOTHYROXINE (SYNTHROID, LEVOTHROID) 75 MCG TABLET    Take 75 mcg by mouth daily.     MIRABEGRON ER (MYRBETRIQ) 50 MG TB24 TABLET    Take 50 mg by mouth at bedtime.   NON FORMULARY    Diet: _____ Regular,  ___x___ NAS,  _______Consistent Carbohydrate,  _______NPO  _____Other   POTASSIUM CHLORIDE (KLOR-CON) 10 MEQ TABLET    Take 10 mEq by mouth daily. Do not crush. Give with food and full glass of liquid.  Modified Medications   No medications on file  Discontinued Medications   No medications on file     SIGNIFICANT DIAGNOSTIC EXAMS   LABS REVIEWED PREVIOUS  06-10-19: wbc 5.9; hgb 11.6; hct 37.2; mcv 102.8 plt 237 glucose 91; bun 50; creat 1.05 ;k+ 4.4; na++ 141; ca 9.1 liver normal albumin 3.5   NO NEW LABS.   Review of Systems  Constitutional: Negative for malaise/fatigue.  Respiratory: Negative for cough and shortness of breath.   Cardiovascular: Negative for chest pain, palpitations and leg swelling.  Gastrointestinal: Negative for abdominal pain, constipation and heartburn.  Musculoskeletal: Negative for back pain, joint pain and myalgias.  Skin: Negative.   Neurological: Negative for dizziness.  Psychiatric/Behavioral: The patient is not nervous/anxious.       Physical Exam Constitutional:      General: She is not in acute distress.    Appearance: She is well-developed. She is not diaphoretic.  Neck:     Thyroid: No thyromegaly.  Cardiovascular:     Rate and Rhythm: Normal rate and regular rhythm.     Heart sounds: Normal heart sounds.     Comments: PP very faint Pulmonary:     Effort: Pulmonary effort is normal. No respiratory distress.     Breath sounds: Normal breath sounds.  Abdominal:     General: Bowel sounds are normal. There is no distension.      Palpations: Abdomen is soft.     Tenderness: There is no abdominal tenderness.  Musculoskeletal:     Cervical back: Neck supple.     Right lower leg: No edema.     Left lower leg:  No edema.     Comments: Is able to move all extremities Feet are stiff and sensitive to touch  Lymphadenopathy:     Cervical: No cervical adenopathy.  Skin:    General: Skin is warm and dry.     Comments: Bilateral lower extremities discolored   Neurological:     Mental Status: She is alert. Mental status is at baseline.  Psychiatric:        Mood and Affect: Mood normal.     ASSESSMENT/ PLAN:  TODAY'  1. Acquired hypothyroidism 2. Failure to thrive in adult 3. CKD stage 2  Will continue current medications Will continue therapy as directed Will continue to monitor her status  The goal of her care is for assisted living or SNF.    Ok Edwards NP [facility]  Contact [phone] Monday through Friday 8am- 5pm  After hours call [phone]

## 2019-06-12 ENCOUNTER — Non-Acute Institutional Stay (SKILLED_NURSING_FACILITY): Payer: Medicare HMO | Admitting: Internal Medicine

## 2019-06-12 ENCOUNTER — Encounter: Payer: Self-pay | Admitting: Internal Medicine

## 2019-06-12 DIAGNOSIS — R32 Unspecified urinary incontinence: Secondary | ICD-10-CM | POA: Diagnosis not present

## 2019-06-12 DIAGNOSIS — E039 Hypothyroidism, unspecified: Secondary | ICD-10-CM | POA: Diagnosis not present

## 2019-06-12 DIAGNOSIS — E876 Hypokalemia: Secondary | ICD-10-CM

## 2019-06-12 DIAGNOSIS — K5909 Other constipation: Secondary | ICD-10-CM | POA: Diagnosis not present

## 2019-06-12 DIAGNOSIS — D649 Anemia, unspecified: Secondary | ICD-10-CM | POA: Diagnosis not present

## 2019-06-12 DIAGNOSIS — R627 Adult failure to thrive: Secondary | ICD-10-CM | POA: Diagnosis not present

## 2019-06-12 DIAGNOSIS — W19XXXS Unspecified fall, sequela: Secondary | ICD-10-CM

## 2019-06-12 DIAGNOSIS — N182 Chronic kidney disease, stage 2 (mild): Secondary | ICD-10-CM | POA: Diagnosis not present

## 2019-06-12 DIAGNOSIS — Z1159 Encounter for screening for other viral diseases: Secondary | ICD-10-CM | POA: Diagnosis not present

## 2019-06-12 NOTE — Progress Notes (Signed)
: Provider:  Hennie Duos., MD Location:  [facility] Room Number: 154-W Place of Service:  SNF ([phone])  PCP: Celene Squibb, MD Patient Care Team: Celene Squibb, MD as PCP - General (Internal Medicine)  Extended Emergency Contact Information Primary Emergency Contact: Dalsanto,[name] Address: [address]          [place], [place] [address] [place] Phone: [phone] Mobile Phone: [phone] Relation: Son Secondary Emergency Contact: Pettie,[name] Address: [address]          [place], [place] [address] [place] Phone: [phone] Mobile Phone: [phone] Relation: Other     Allergies: Cephalexin, Cephalexin, and Penicillins  Chief Complaint  Patient presents with  . New Admit To SNF    New admission to [facility]    HPI: Patient is a 86 y.o. female lives at home independently with history of overactive bladder and hypothyroidism who is being transferred directly from our phone to skilled nursing facility after several falls at home and she can no longer maintain her ADLs at home.  Patient denied any uncontrolled pain insomnia, focal weakness chest pain shortness breath or other.  Skilled nursing facility patient will be followed for hypothyroidism treated with Synthroid, constipation treated with Colace and overactive bladder treated with Myrbetriq.  Past Medical History:  Diagnosis Date  . Thyroid disease     Past Surgical History:  Procedure Laterality Date  . ABDOMINAL HYSTERECTOMY    . TONSILLECTOMY    . TOTAL HIP ARTHROPLASTY     left    Allergies as of 06/12/2019      Reactions   Cephalexin Other (See Comments)   Pt. Doesn't remember.    Cephalexin Other (See Comments)   Pt. Doesn't remember.    Penicillins Other (See Comments)   Pt. Cant remember.       Medication List    Notice   This visit is during an admission. Changes to the med list made in this visit will be reflected in  the After Visit Summary of the admission.    Current Outpatient Medications on File Prior to Visit  Medication Sig Dispense Refill  . acetaminophen (TYLENOL) 500 MG tablet 1st Order:  oral Special Instructions: As needed for pain. Max 3gm acetaminophen/24 hrs from all sources. Every 8 Hours - PRN PRN 1, PRN 2, PRN 3   2nd Order:  amt: 2 tabs (1000mg  total); oral Special Instructions: As needed for pain. Max 3gm acetaminophen/24 hrs from all sources. Every 8 Hours - PRN PRN 1, PRN 2, PRN 3    . diclofenac Sodium (VOLTAREN) 1 % GEL Apply 2 g topically every 6 (six) hours as needed. Apply topically to bilateral knees as needed for arthritic pain.    Marland Kitchen docusate sodium (COLACE) 100 MG capsule Take 100 mg by mouth 2 (two) times daily.    . feeding supplement, ENSURE ENLIVE, (ENSURE ENLIVE) LIQD Take 237 mLs by mouth daily.    . furosemide (LASIX) 20 MG tablet Take 20 mg by mouth daily as needed. As needed for weight gain greater than 3 pounds in one day. Notify provider if given.    Marland Kitchen glucosamine-chondroitin 500-400 MG tablet Take 1 tablet by mouth daily.    Marland Kitchen ibuprofen (ADVIL) 400 MG tablet Take 400 mg by mouth every 4 (four) hours as needed (For Pain).    Marland Kitchen levothyroxine (SYNTHROID) 100 MCG tablet Take 100 mcg by mouth daily before breakfast.    .  mirabegron ER (MYRBETRIQ) 50 MG TB24 tablet Take 50 mg by mouth at bedtime.    . NON FORMULARY Diet: _____ Regular,  ___x___ NAS,  _______Consistent Carbohydrate,  _______NPO  _____Other    . potassium chloride (KLOR-CON) 10 MEQ tablet Take 10 mEq by mouth daily. Do not crush. Give with food and full glass of liquid.     No current facility-administered medications on file prior to visit.     No orders of the defined types were placed in this encounter.   Immunization History  Administered Date(s) Administered  . Influenza-Unspecified 02/09/2018    Social History   Tobacco Use  . Smoking status: Never Smoker  . Smokeless tobacco: Never  Used  Substance Use Topics  . Alcohol use: No    Family history is   Family History  Problem Relation Age of Onset  . Heart attack Mother   . Heart attack Father       Review of Systems  GENERAL:  no fevers, fatigue, appetite changes SKIN: No itching, or rash EYES: No eye pain, redness, discharge EARS: No earache, tinnitus, change in hearing NOSE: No congestion, drainage or bleeding  MOUTH/THROAT: No mouth or tooth pain, No sore throat RESPIRATORY: No cough, wheezing, SOB CARDIAC: No chest pain, palpitations, lower extremity edema  GI: No abdominal pain, No N/V/D or constipation, No heartburn or reflux  GU: No dysuria, frequency or urgency, or incontinence  MUSCULOSKELETAL: No unrelieved bone/joint pain NEUROLOGIC: No headache, dizziness or focal weakness PSYCHIATRIC: No c/o anxiety or sadness   Vitals:   06/12/19 1215  BP: (!) 144/71  Pulse: 62  Resp: 20  Temp: 98.1 F (36.7 C)  SpO2: 90%    SpO2 Readings from Last 1 Encounters:  06/12/19 90%   Body mass index is 27.06 kg/m.     Physical Exam  GENERAL APPEARANCE: Alert, conversant,  No acute distress.  SKIN: Skin of bilateral lower extremities discolored HEAD: Normocephalic, atraumatic  EYES: Conjunctiva/lids clear. Pupils round, reactive. EOMs intact.  EARS: External exam WNL, canals clear. Hearing grossly normal.  NOSE: No deformity or discharge.  MOUTH/THROAT: Lips w/o lesions  RESPIRATORY: Breathing is even, unlabored. Lung sounds are clear   CARDIOVASCULAR: Heart RRR no murmurs, rubs or gallops. No peripheral edema.   GASTROINTESTINAL: Abdomen is soft, non-tender, not distended w/ normal bowel sounds. GENITOURINARY: Bladder non tender, not distended  MUSCULOSKELETAL: No abnormal joints or musculature NEUROLOGIC:  Cranial nerves 2-12 grossly intact. Moves all extremities  PSYCHIATRIC: Mood and affect appropriate to situation, no behavioral issues  Patient Active Problem List   Diagnosis Date  Noted  . Urinary incontinence in female 06/11/2019  . Chronic constipation 06/11/2019  . Hypokalemia 06/11/2019  . Failure to thrive in adult 06/11/2019  . Hip pain, chronic 12/11/2014  . Hypothyroidism 11/08/2014  . Low back pain 11/08/2014  . Fall 11/08/2014  . CKD (chronic kidney disease) stage 2, GFR 60-89 ml/min 11/08/2014  . RECTAL BLEEDING 08/22/2008  . HEMORRHOIDS, WITH BLEEDING 08/12/2008  . HEMATOCHEZIA 08/12/2008  . CHANGE IN BOWELS 08/12/2008      Labs reviewed: Basic Metabolic Panel:    Component Value Date/Time   NA 141 06/10/2019 0313   K 4.4 06/10/2019 0313   CL 103 06/10/2019 0313   CO2 28 06/10/2019 0313   GLUCOSE 91 06/10/2019 0313   BUN 50 (H) 06/10/2019 0313   CREATININE 1.05 (H) 06/10/2019 0313   CALCIUM 9.1 06/10/2019 0313   PROT 7.0 06/10/2019 0313   ALBUMIN 3.5 06/10/2019  0313   AST 19 06/10/2019 0313   ALT 13 06/10/2019 0313   ALKPHOS 72 06/10/2019 0313   BILITOT 0.8 06/10/2019 0313   GFRNONAA 44 (L) 06/10/2019 0313   GFRAA 51 (L) 06/10/2019 0313    Recent Labs    06/10/19 0313  NA 141  K 4.4  CL 103  CO2 28  GLUCOSE 91  BUN 50*  CREATININE 1.05*  CALCIUM 9.1   Liver Function Tests: Recent Labs    06/10/19 0313  AST 19  ALT 13  ALKPHOS 72  BILITOT 0.8  PROT 7.0  ALBUMIN 3.5   No results for input(s): LIPASE, AMYLASE in the last 8760 hours. No results for input(s): AMMONIA in the last 8760 hours. CBC: Recent Labs    06/10/19 0313  WBC 5.9  HGB 11.6*  HCT 37.2  MCV 102.8*  PLT 237   Lipid No results for input(s): CHOL, HDL, LDLCALC, TRIG in the last 8760 hours.  Cardiac Enzymes: No results for input(s): CKTOTAL, CKMB, CKMBINDEX, TROPONINI in the last 8760 hours. BNP: No results for input(s): BNP in the last 8760 hours. No results found for: MICROALBUR No results found for: HGBA1C Lab Results  Component Value Date   TSH 13.006 (H) 06/10/2019   Lab Results  Component Value Date   VITAMINB12 414 11/25/2014     Lab Results  Component Value Date   FOLATE 59.7 11/25/2014   No results found for: IRON, TIBC, FERRITIN  Imaging and Procedures obtained prior to SNF admission: No results found.   Not all labs, radiology exams or other studies done during hospitalization come through on my EPIC note; however they are reviewed by me.    Assessment and Plan  Falls/failure to thrive-weight stable 145 pounds, albumin 3.5; admitted for short-term rehab to strengthen  Hypothyroidism, acquired-TSH 13.006; increasing Synthroid to 100 mcg daily and recheck TSH  Overactive bladder/urinary incontinence-continue Myrbetriq 50 mg daily  Chronic kidney disease stage II-stable with BUN 50/creatinine 1.05; will encourage fluids  Hypokalemia-well-controlled on potassium 10 mEq daily; pertinent potassium is 4.4  Constipation, chronic-stable; continue Colace twice daily   Time spent greater than 45 minutes;> 50% of time with patient was spent reviewing records, labs, tests and studies, counseling and developing plan of care  Margit Hanks, MD

## 2019-06-15 ENCOUNTER — Encounter: Payer: Self-pay | Admitting: Internal Medicine

## 2019-06-17 ENCOUNTER — Encounter: Payer: Self-pay | Admitting: Adult Health

## 2019-06-17 ENCOUNTER — Non-Acute Institutional Stay (SKILLED_NURSING_FACILITY): Payer: Medicare HMO | Admitting: Adult Health

## 2019-06-17 DIAGNOSIS — E039 Hypothyroidism, unspecified: Secondary | ICD-10-CM

## 2019-06-17 DIAGNOSIS — E876 Hypokalemia: Secondary | ICD-10-CM

## 2019-06-17 DIAGNOSIS — K5909 Other constipation: Secondary | ICD-10-CM | POA: Diagnosis not present

## 2019-06-17 NOTE — Progress Notes (Signed)
Location:    [facility] Room Number: 154/W Place of Service:  SNF (31)   CODE STATUS: Full Code  Allergies  Allergen Reactions  . Cephalexin Other (See Comments)    Pt. Doesn't remember.   . Cephalexin Other (See Comments)    Pt. Doesn't remember.   . Penicillins Other (See Comments)    Pt. Cant remember.     Chief Complaint  Patient presents with  . Medical Management of Chronic Issues         Acquired hypothyroidism:  Chronic constipation:  Hypokalemia:   Weekly follow up for the first 30 days post hospitalization.     HPI:  She is a 86 year old short term rehab patient being seen for the management of her chronic illnesses; hypothyroidism; constipation; hypokalemia. There are no reports of uncontrolled pain; no changes in appetite; no constipation.   Past Medical History:  Diagnosis Date  . Thyroid disease     Past Surgical History:  Procedure Laterality Date  . ABDOMINAL HYSTERECTOMY    . TONSILLECTOMY    . TOTAL HIP ARTHROPLASTY     left    Social History   Socioeconomic History  . Marital status: Widowed    Spouse name: Not on file  . Number of children: Not on file  . Years of education: Not on file  . Highest education level: Not on file  Occupational History  . Not on file  Tobacco Use  . Smoking status: Never Smoker  . Smokeless tobacco: Never Used  Substance and Sexual Activity  . Alcohol use: No  . Drug use: No  . Sexual activity: Not Currently  Other Topics Concern  . Not on file  Social History Narrative  . Not on file   Social Determinants of Health   Financial Resource Strain:   . Difficulty of Paying Living Expenses: Not on file  Food Insecurity:   . Worried About Programme researcher, broadcasting/film/video in the Last Year: Not on file  . Ran Out of Food in the Last Year: Not on file  Transportation Needs:   . Lack of Transportation (Medical): Not on file  . Lack of Transportation (Non-Medical): Not on file  Physical Activity:    . Days of Exercise per Week: Not on file  . Minutes of Exercise per Session: Not on file  Stress:   . Feeling of Stress : Not on file  Social Connections:   . Frequency of Communication with Friends and Family: Not on file  . Frequency of Social Gatherings with Friends and Family: Not on file  . Attends Religious Services: Not on file  . Active Member of Clubs or Organizations: Not on file  . Attends Banker Meetings: Not on file  . Marital Status: Not on file  Intimate Partner Violence:   . Fear of Current or Ex-Partner: Not on file  . Emotionally Abused: Not on file  . Physically Abused: Not on file  . Sexually Abused: Not on file   Family History  Problem Relation Age of Onset  . Heart attack Mother   . Heart attack Father       VITAL SIGNS BP 129/72   Pulse 88   Temp (!) 97 F (36.1 C) (Oral)   Resp 20   Ht 5\' 1"  (1.549 m)   Wt 143 lb 3.2 oz (65 kg)   SpO2 90%   BMI 27.06 kg/m   Outpatient Encounter Medications as of 06/17/2019  Medication Sig  . acetaminophen (TYLENOL) 500 MG tablet 1st Order:  oral Special Instructions: As needed for pain. Max 3gm acetaminophen/24 hrs from all sources. Every 8 Hours - PRN PRN 1, PRN 2, PRN 3   2nd Order:  amt: 2 tabs (1000mg  total); oral Special Instructions: As needed for pain. Max 3gm acetaminophen/24 hrs from all sources. Every 8 Hours - PRN PRN 1, PRN 2, PRN 3  . diclofenac Sodium (VOLTAREN) 1 % GEL Apply 2 g topically every 6 (six) hours as needed. Apply topically to bilateral knees as needed for arthritic pain.  Marland Kitchen docusate sodium (COLACE) 100 MG capsule Take 100 mg by mouth 2 (two) times daily.  . feeding supplement, ENSURE ENLIVE, (ENSURE ENLIVE) LIQD Take 237 mLs by mouth daily.  . furosemide (LASIX) 20 MG tablet Take 20 mg by mouth daily as needed. As needed for weight gain greater than 3 pounds in one day. Notify provider if given.  Marland Kitchen glucosamine-chondroitin 500-400 MG tablet Take 1 tablet by mouth daily.   Marland Kitchen ibuprofen (ADVIL) 400 MG tablet Take 400 mg by mouth every 4 (four) hours as needed (For Pain).  Marland Kitchen levothyroxine (SYNTHROID) 100 MCG tablet Take 100 mcg by mouth daily before breakfast.  . mirabegron ER (MYRBETRIQ) 50 MG TB24 tablet Take 50 mg by mouth at bedtime.  . NON FORMULARY Diet: _____ Regular,  ___x___ NAS,  _______Consistent Carbohydrate,  _______NPO  _____Other  . potassium chloride (KLOR-CON) 10 MEQ tablet Take 10 mEq by mouth daily. Do not crush. Give with food and full glass of liquid.   No facility-administered encounter medications on file as of 06/17/2019.     SIGNIFICANT DIAGNOSTIC EXAMS  LABS REVIEWED PREVIOUS  06-10-19: wbc 5.9; hgb 11.6; hct 37.2; mcv 102.8 plt 237 glucose 91; bun 50; creat 1.05 ;k+ 4.4; na++ 141; ca 9.1 liver normal albumin 3.5  NO NEW LABS.    Review of Systems  Constitutional: Negative for malaise/fatigue.  Respiratory: Negative for cough and shortness of breath.   Cardiovascular: Negative for chest pain, palpitations and leg swelling.  Gastrointestinal: Negative for abdominal pain, constipation and heartburn.  Musculoskeletal: Negative for back pain, joint pain and myalgias.  Skin: Negative.   Neurological: Negative for dizziness.  Psychiatric/Behavioral: The patient is not nervous/anxious.     Physical Exam Constitutional:      General: She is not in acute distress.    Appearance: She is well-developed. She is not diaphoretic.  Neck:     Thyroid: No thyromegaly.  Cardiovascular:     Rate and Rhythm: Normal rate and regular rhythm.     Pulses: Normal pulses.     Heart sounds: Normal heart sounds.     Comments: PP very faint Pulmonary:     Effort: Pulmonary effort is normal. No respiratory distress.     Breath sounds: Normal breath sounds.  Abdominal:     General: Bowel sounds are normal. There is no distension.     Palpations: Abdomen is soft.     Tenderness: There is no abdominal tenderness.  Musculoskeletal:     Cervical  back: Neck supple.     Right lower leg: No edema.     Left lower leg: No edema.     Comments: Is able to move all extremities Feet are stiff and sensitive to touch   Lymphadenopathy:     Cervical: No cervical adenopathy.  Skin:    General: Skin is warm and dry.     Comments: Bilateral lower extremities discolored  Neurological:     Mental Status: She is alert. Mental status is at baseline.  Psychiatric:        Mood and Affect: Mood normal.      ASSESSMENT/ PLAN:  TODAY  1. Acquired hypothyroidism: is stable tsh 13.006 will continue synthroid 100 mcg daily and will check tsh on 4-46-21.  2. Chronic constipation: is stable will continue colace twice daily   3. Hypokalemia: is stable k+ 4.4 will continue k+ 10 meq daily   PREVIOUS  4. Hypokalemia: is stable k+ 4.4 will continue k+ 10 meq daily   5. Failure to thrive in adult: is stable weight is 145 pounds will monitor her status will give supplements as directed; albumin 3.5   6. CKD (chronic kidney disease) stage 2: is stable bun 50; creat 1.05 will monitor  7. Urinary incontinence in female is stable will continue mybretriq 50 mg daily          MD is aware of resident's narcotic use and is in agreement with current plan of care. We will attempt to wean resident as appropriate.  Synthia Innocent NP [facility]  Contact [phone] Monday through Friday 8am- 5pm  After hours call [phone]

## 2019-06-20 ENCOUNTER — Non-Acute Institutional Stay (SKILLED_NURSING_FACILITY): Payer: Medicare HMO | Admitting: Adult Health

## 2019-06-20 ENCOUNTER — Encounter: Payer: Self-pay | Admitting: Adult Health

## 2019-06-20 DIAGNOSIS — N182 Chronic kidney disease, stage 2 (mild): Secondary | ICD-10-CM

## 2019-06-20 DIAGNOSIS — Z1159 Encounter for screening for other viral diseases: Secondary | ICD-10-CM | POA: Diagnosis not present

## 2019-06-20 DIAGNOSIS — R627 Adult failure to thrive: Secondary | ICD-10-CM | POA: Diagnosis not present

## 2019-06-20 DIAGNOSIS — E039 Hypothyroidism, unspecified: Secondary | ICD-10-CM | POA: Diagnosis not present

## 2019-06-20 DIAGNOSIS — D649 Anemia, unspecified: Secondary | ICD-10-CM | POA: Diagnosis not present

## 2019-06-20 NOTE — Progress Notes (Signed)
Location:    [facility] Room Number: 154/W Place of Service:  SNF (31)   CODE STATUS: Full Code  Allergies  Allergen Reactions  . Cephalexin Other (See Comments)    Pt. Doesn't remember.   . Cephalexin Other (See Comments)    Pt. Doesn't remember.   . Penicillins Other (See Comments)    Pt. Cant remember.     Chief Complaint  Patient presents with  . Acute Visit    Care Plan Meeting    HPI:  We have come together for her care plan meeting. There is family present BIMS 15/15 mood 2/30. This does represent a long term placement fo her. She is incontinent. She does require assistance for her adls. She eats about 1/2 of her meals. She is afraid of gaining weight. There are no reports of uncontrolled pain. She continues to be followed for her chronic illnesses including: hypothyroidism; ftt; ckd stage 2.   Past Medical History:  Diagnosis Date  . Thyroid disease     Past Surgical History:  Procedure Laterality Date  . ABDOMINAL HYSTERECTOMY    . TONSILLECTOMY    . TOTAL HIP ARTHROPLASTY     left    Social History   Socioeconomic History  . Marital status: Widowed    Spouse name: Not on file  . Number of children: Not on file  . Years of education: Not on file  . Highest education level: Not on file  Occupational History  . Not on file  Tobacco Use  . Smoking status: Never Smoker  . Smokeless tobacco: Never Used  Substance and Sexual Activity  . Alcohol use: No  . Drug use: No  . Sexual activity: Not Currently  Other Topics Concern  . Not on file  Social History Narrative  . Not on file   Social Determinants of Health   Financial Resource Strain:   . Difficulty of Paying Living Expenses:   Food Insecurity:   . Worried About Programme researcher, broadcasting/film/video in the Last Year:   . Barista in the Last Year:   Transportation Needs:   . Freight forwarder (Medical):   Marland Kitchen Lack of Transportation (Non-Medical):   Physical Activity:   .  Days of Exercise per Week:   . Minutes of Exercise per Session:   Stress:   . Feeling of Stress :   Social Connections:   . Frequency of Communication with Friends and Family:   . Frequency of Social Gatherings with Friends and Family:   . Attends Religious Services:   . Active Member of Clubs or Organizations:   . Attends Banker Meetings:   Marland Kitchen Marital Status:   Intimate Partner Violence:   . Fear of Current or Ex-Partner:   . Emotionally Abused:   Marland Kitchen Physically Abused:   . Sexually Abused:    Family History  Problem Relation Age of Onset  . Heart attack Mother   . Heart attack Father       VITAL SIGNS BP 128/84   Pulse 84   Temp 98.4 F (36.9 C) (Oral)   Resp 20   Ht 5\' 1"  (1.549 m)   Wt 142 lb (64.4 kg)   SpO2 90%   BMI 26.83 kg/m   Outpatient Encounter Medications as of 06/20/2019  Medication Sig  . acetaminophen (TYLENOL) 500 MG tablet 1st Order:  oral Special Instructions: As needed for pain. Max 3gm acetaminophen/24 hrs from all sources. Every 8  Hours - PRN PRN 1, PRN 2, PRN 3   2nd Order:  amt: 2 tabs (1000mg  total); oral Special Instructions: As needed for pain. Max 3gm acetaminophen/24 hrs from all sources. Every 8 Hours - PRN PRN 1, PRN 2, PRN 3  . diclofenac Sodium (VOLTAREN) 1 % GEL Apply 2 g topically every 6 (six) hours as needed. Apply topically to bilateral knees as needed for arthritic pain.  Marland Kitchen docusate sodium (COLACE) 100 MG capsule Take 100 mg by mouth 2 (two) times daily.  . feeding supplement, ENSURE ENLIVE, (ENSURE ENLIVE) LIQD Take 237 mLs by mouth daily.  . furosemide (LASIX) 20 MG tablet Take 20 mg by mouth daily as needed. As needed for weight gain greater than 3 pounds in one day. Notify provider if given.  Marland Kitchen glucosamine-chondroitin 500-400 MG tablet Take 1 tablet by mouth daily.  Marland Kitchen ibuprofen (ADVIL) 400 MG tablet Take 400 mg by mouth every 4 (four) hours as needed (For Pain).  Marland Kitchen levothyroxine (SYNTHROID) 100 MCG tablet Take 100  mcg by mouth daily before breakfast.  . mirabegron ER (MYRBETRIQ) 50 MG TB24 tablet Take 50 mg by mouth at bedtime.  . NON FORMULARY Diet: _____ Regular,  ___x___ NAS,  _______Consistent Carbohydrate,  _______NPO  _____Other  . potassium chloride (KLOR-CON) 10 MEQ tablet Take 10 mEq by mouth daily. Do not crush. Give with food and full glass of liquid.   No facility-administered encounter medications on file as of 06/20/2019.     SIGNIFICANT DIAGNOSTIC EXAMS   LABS REVIEWED PREVIOUS  06-10-19: wbc 5.9; hgb 11.6; hct 37.2; mcv 102.8 plt 237 glucose 91; bun 50; creat 1.05 ;k+ 4.4; na++ 141; ca 9.1 liver normal albumin 3.5  NO NEW LABS.    Review of Systems  Constitutional: Negative for malaise/fatigue.  Respiratory: Negative for cough and shortness of breath.   Cardiovascular: Negative for chest pain, palpitations and leg swelling.  Gastrointestinal: Negative for abdominal pain, constipation and heartburn.  Musculoskeletal: Negative for back pain, joint pain and myalgias.  Skin: Negative.   Neurological: Negative for dizziness.  Psychiatric/Behavioral: The patient is not nervous/anxious.     Physical Exam Constitutional:      General: She is not in acute distress.    Appearance: She is well-developed. She is not diaphoretic.  Neck:     Thyroid: No thyromegaly.  Cardiovascular:     Rate and Rhythm: Normal rate and regular rhythm.     Heart sounds: Normal heart sounds.     Comments: PP very faint Pulmonary:     Effort: Pulmonary effort is normal. No respiratory distress.     Breath sounds: Normal breath sounds.  Abdominal:     General: Bowel sounds are normal. There is no distension.     Palpations: Abdomen is soft.     Tenderness: There is no abdominal tenderness.  Musculoskeletal:     Cervical back: Neck supple.     Right lower leg: No edema.     Left lower leg: No edema.     Comments: Is able to move all extremities Feet are stiff and sensitive to touch      Lymphadenopathy:     Cervical: No cervical adenopathy.  Skin:    General: Skin is warm and dry.     Comments: Bilateral lower extremities discolored    Neurological:     Mental Status: She is alert and oriented to person, place, and time.  Psychiatric:        Mood and Affect: Mood  normal.       ASSESSMENT/ PLAN:  TODAY  1. acquired hypothyroidism 2. ckd stage 2 3.  Failure to thrive in adult  Will continue current medications Will continue current plan of care Will continue to monitor her status.     MD is aware of resident's narcotic use and is in agreement with current plan of care. We will attempt to wean resident as appropriate.  Synthia Innocent NP [facility]  Contact [phone] Monday through Friday 8am- 5pm  After hours call [phone]

## 2019-06-24 ENCOUNTER — Non-Acute Institutional Stay (SKILLED_NURSING_FACILITY): Payer: Medicare HMO | Admitting: Adult Health

## 2019-06-24 ENCOUNTER — Encounter: Payer: Self-pay | Admitting: Adult Health

## 2019-06-24 DIAGNOSIS — N182 Chronic kidney disease, stage 2 (mild): Secondary | ICD-10-CM | POA: Diagnosis not present

## 2019-06-24 DIAGNOSIS — E876 Hypokalemia: Secondary | ICD-10-CM

## 2019-06-24 DIAGNOSIS — R627 Adult failure to thrive: Secondary | ICD-10-CM | POA: Diagnosis not present

## 2019-06-24 NOTE — Progress Notes (Signed)
Location:    [facility] Room Number: 145/W Place of Service:  SNF (31)   CODE STATUS: Full Code  Allergies  Allergen Reactions  . Cephalexin Other (See Comments)    Pt. Doesn't remember.   . Cephalexin Other (See Comments)    Pt. Doesn't remember.   . Penicillins Other (See Comments)    Pt. Cant remember.     Chief Complaint  Patient presents with  . Medical Management of Chronic Issues          Hypokalemia:   Failure to thrive in adult:   CKD (chronic kidney disease) stage 2   Weekly follow up for the first 30 days post hospitalization.      HPI:  She is a 86 year old short term rehab patient being seen for the management of her chronic illnesses: hypokalemia; ftt; ckd stage 2. There are no reports of uncontrolled pain. No reports of anxiety or agitation.   Past Medical History:  Diagnosis Date  . Thyroid disease     Past Surgical History:  Procedure Laterality Date  . ABDOMINAL HYSTERECTOMY    . TONSILLECTOMY    . TOTAL HIP ARTHROPLASTY     left    Social History   Socioeconomic History  . Marital status: Widowed    Spouse name: Not on file  . Number of children: Not on file  . Years of education: Not on file  . Highest education level: Not on file  Occupational History  . Not on file  Tobacco Use  . Smoking status: Never Smoker  . Smokeless tobacco: Never Used  Substance and Sexual Activity  . Alcohol use: No  . Drug use: No  . Sexual activity: Not Currently  Other Topics Concern  . Not on file  Social History Narrative  . Not on file   Social Determinants of Health   Financial Resource Strain:   . Difficulty of Paying Living Expenses:   Food Insecurity:   . Worried About Programme researcher, broadcasting/film/video in the Last Year:   . Barista in the Last Year:   Transportation Needs:   . Freight forwarder (Medical):   Marland Kitchen Lack of Transportation (Non-Medical):   Physical Activity:   . Days of Exercise per Week:   . Minutes of  Exercise per Session:   Stress:   . Feeling of Stress :   Social Connections:   . Frequency of Communication with Friends and Family:   . Frequency of Social Gatherings with Friends and Family:   . Attends Religious Services:   . Active Member of Clubs or Organizations:   . Attends Banker Meetings:   Marland Kitchen Marital Status:   Intimate Partner Violence:   . Fear of Current or Ex-Partner:   . Emotionally Abused:   Marland Kitchen Physically Abused:   . Sexually Abused:    Family History  Problem Relation Age of Onset  . Heart attack Mother   . Heart attack Father       VITAL SIGNS BP 101/68   Pulse 74   Temp 97.6 F (36.4 C) (Oral)   Resp 20   Ht 5\' 1"  (1.549 m)   Wt 144 lb 1.6 oz (65.4 kg)   SpO2 90%   BMI 27.23 kg/m   Outpatient Encounter Medications as of 06/24/2019  Medication Sig  . acetaminophen (TYLENOL) 500 MG tablet 1st Order:  oral Special Instructions: As needed for pain. Max 3gm acetaminophen/24 hrs from  all sources. Every 8 Hours - PRN PRN 1, PRN 2, PRN 3   2nd Order:  amt: 2 tabs (1000mg  total); oral Special Instructions: As needed for pain. Max 3gm acetaminophen/24 hrs from all sources. Every 8 Hours - PRN PRN 1, PRN 2, PRN 3  . diclofenac Sodium (VOLTAREN) 1 % GEL Apply 2 g topically every 6 (six) hours as needed. Apply topically to bilateral knees as needed for arthritic pain.  Marland Kitchen docusate sodium (COLACE) 100 MG capsule Take 100 mg by mouth 2 (two) times daily.  . feeding supplement, ENSURE ENLIVE, (ENSURE ENLIVE) LIQD Take 237 mLs by mouth daily.  . furosemide (LASIX) 20 MG tablet Take 20 mg by mouth daily as needed. As needed for weight gain greater than 3 pounds in one day. Notify provider if given.  Marland Kitchen glucosamine-chondroitin 500-400 MG tablet Take 1 tablet by mouth daily.  Marland Kitchen ibuprofen (ADVIL) 400 MG tablet Take 400 mg by mouth every 4 (four) hours as needed (For Pain).  Marland Kitchen levothyroxine (SYNTHROID) 100 MCG tablet Take 100 mcg by mouth daily before breakfast.   . mirabegron ER (MYRBETRIQ) 50 MG TB24 tablet Take 50 mg by mouth at bedtime.  . NON FORMULARY Diet: _____ Regular,  ___x___ NAS,  _______Consistent Carbohydrate,  _______NPO  _____Other  . potassium chloride (KLOR-CON) 10 MEQ tablet Take 10 mEq by mouth daily. Do not crush. Give with food and full glass of liquid.   No facility-administered encounter medications on file as of 06/24/2019.     SIGNIFICANT DIAGNOSTIC EXAMS   LABS REVIEWED PREVIOUS  06-10-19: wbc 5.9; hgb 11.6; hct 37.2; mcv 102.8 plt 237 glucose 91; bun 50; creat 1.05 ;k+ 4.4; na++ 141; ca 9.1 liver normal albumin 3.5  NO NEW LABS.    Review of Systems  Constitutional: Negative for malaise/fatigue.  Respiratory: Negative for cough and shortness of breath.   Cardiovascular: Negative for chest pain, palpitations and leg swelling.  Gastrointestinal: Negative for abdominal pain, constipation and heartburn.  Musculoskeletal: Negative for back pain, joint pain and myalgias.  Skin: Negative.   Neurological: Negative for dizziness.  Psychiatric/Behavioral: The patient is not nervous/anxious.     Physical Exam Constitutional:      General: She is not in acute distress.    Appearance: She is well-developed. She is not diaphoretic.  Neck:     Thyroid: No thyromegaly.  Cardiovascular:     Rate and Rhythm: Normal rate and regular rhythm.     Heart sounds: Normal heart sounds.     Comments: Pp very faint  Pulmonary:     Effort: Pulmonary effort is normal. No respiratory distress.     Breath sounds: Normal breath sounds.  Abdominal:     General: Bowel sounds are normal. There is no distension.     Palpations: Abdomen is soft.     Tenderness: There is no abdominal tenderness.  Musculoskeletal:     Cervical back: Neck supple.     Right lower leg: No edema.     Left lower leg: No edema.     Comments: Is able to move all extremities Feet are stiff and sensitive to touch     Lymphadenopathy:     Cervical: No cervical  adenopathy.  Skin:    General: Skin is warm and dry.     Comments: Bilateral lower extremities discolored     Neurological:     Mental Status: She is alert and oriented to person, place, and time.  Psychiatric:  Mood and Affect: Mood normal.      ASSESSMENT/ PLAN:  TODAY  1. Hypokalemia: is stable k+ 4.4; will continue k+ 10 meq daily   2. Failure to thrive in adult: is stable weight is 137 pounds albumin 3.5 will continue supplements as directed  3. CKD (chronic kidney disease) stage 2 is stable bun 50 creat 1.05    PREVIOUS  4.. Urinary incontinence in female is stable will continue mybretriq 50 mg daily  5. Acquired hypothyroidism: is stable tsh 13.006 will continue synthroid 100 mcg daily and will check tsh on 4-46-21.  6. Chronic constipation: is stable will continue colace twice daily   7. Hypokalemia: is stable k+ 4.4 will continue k+ 10 meq daily   MD is aware of resident's narcotic use and is in agreement with current plan of care. We will attempt to wean resident as appropriate.  Synthia Innocent NP [facility]  Contact [phone] Monday through Friday 8am- 5pm  After hours call [phone]

## 2019-06-26 DIAGNOSIS — D649 Anemia, unspecified: Secondary | ICD-10-CM | POA: Diagnosis not present

## 2019-06-26 DIAGNOSIS — Z1159 Encounter for screening for other viral diseases: Secondary | ICD-10-CM | POA: Diagnosis not present

## 2019-06-27 ENCOUNTER — Non-Acute Institutional Stay (SKILLED_NURSING_FACILITY): Payer: Medicare HMO | Admitting: Adult Health

## 2019-06-27 ENCOUNTER — Encounter: Payer: Self-pay | Admitting: Adult Health

## 2019-06-27 DIAGNOSIS — R634 Abnormal weight loss: Secondary | ICD-10-CM | POA: Diagnosis not present

## 2019-06-27 NOTE — Progress Notes (Signed)
Location:    [facility] Room Number: 145/W Place of Service:  SNF (31)   CODE STATUS: Full Code  Allergies  Allergen Reactions  . Cephalexin Other (See Comments)    Pt. Doesn't remember.   . Cephalexin Other (See Comments)    Pt. Doesn't remember.   . Penicillins Other (See Comments)    Pt. Cant remember.     Chief Complaint  Patient presents with  . Acute Visit    Weight Loss     HPI:  She is losing weight. Her admission weight was 145 pounds with her current weight of 137 pounds. Her appetite remains poor eating 25% or less at meals. Will drink ensure. She is not complaining of pain. She continues to engage with those around her. She states that she does not have an appetite.   Past Medical History:  Diagnosis Date  . Thyroid disease     Past Surgical History:  Procedure Laterality Date  . ABDOMINAL HYSTERECTOMY    . TONSILLECTOMY    . TOTAL HIP ARTHROPLASTY     left    Social History   Socioeconomic History  . Marital status: Widowed    Spouse name: Not on file  . Number of children: Not on file  . Years of education: Not on file  . Highest education level: Not on file  Occupational History  . Not on file  Tobacco Use  . Smoking status: Never Smoker  . Smokeless tobacco: Never Used  Substance and Sexual Activity  . Alcohol use: No  . Drug use: No  . Sexual activity: Not Currently  Other Topics Concern  . Not on file  Social History Narrative  . Not on file   Social Determinants of Health   Financial Resource Strain:   . Difficulty of Paying Living Expenses:   Food Insecurity:   . Worried About Charity fundraiser in the Last Year:   . Arboriculturist in the Last Year:   Transportation Needs:   . Film/video editor (Medical):   Marland Kitchen Lack of Transportation (Non-Medical):   Physical Activity:   . Days of Exercise per Week:   . Minutes of Exercise per Session:   Stress:   . Feeling of Stress :   Social Connections:     . Frequency of Communication with Friends and Family:   . Frequency of Social Gatherings with Friends and Family:   . Attends Religious Services:   . Active Member of Clubs or Organizations:   . Attends Archivist Meetings:   Marland Kitchen Marital Status:   Intimate Partner Violence:   . Fear of Current or Ex-Partner:   . Emotionally Abused:   Marland Kitchen Physically Abused:   . Sexually Abused:    Family History  Problem Relation Age of Onset  . Heart attack Mother   . Heart attack Father       VITAL SIGNS BP 115/84   Pulse (!) 57   Temp (!) 97 F (36.1 C) (Oral)   Resp 20   Ht 5\' 1"  (1.549 m)   Wt 137 lb 9.6 oz (62.4 kg)   SpO2 90%   BMI 26.00 kg/m   Outpatient Encounter Medications as of 06/27/2019  Medication Sig  . acetaminophen (TYLENOL) 500 MG tablet 1st Order:  oral Special Instructions: As needed for pain. Max 3gm acetaminophen/24 hrs from all sources. Every 8 Hours - PRN PRN 1, PRN 2, PRN 3   2nd Order:  amt: 2 tabs (1000mg  total); oral Special Instructions: As needed for pain. Max 3gm acetaminophen/24 hrs from all sources. Every 8 Hours - PRN PRN 1, PRN 2, PRN 3  . Chloroxylenol, Antiseptic, (PREP PROTECTIVE SKIN BARRIER EX) Apply topically. Apply skin prep to bilateral heels qshift for prevention. Every Shift Day, Evening, Night  . diclofenac Sodium (VOLTAREN) 1 % GEL Apply 2 g topically every 6 (six) hours as needed. Apply topically to bilateral knees as needed for arthritic pain.  docusate sodium (COLACE) 100 MG capsule Take 100 mg by mouth 2 (two) times daily.  . feeding supplement, ENSURE ENLIVE, (ENSURE ENLIVE) LIQD Take 237 mLs by mouth daily.  . furosemide (LASIX) 20 MG tablet Take 20 mg by mouth daily as needed. As needed for weight gain greater than 3 pounds in one day. Notify provider if given.  Marland Kitchen glucosamine-chondroitin 500-400 MG tablet Take 1 tablet by mouth daily.  Marland Kitchen ibuprofen (ADVIL) 400 MG tablet Take 400 mg by mouth every 4 (four) hours as needed (For  Pain).  Marland Kitchen levothyroxine (SYNTHROID) 100 MCG tablet Take 100 mcg by mouth daily before breakfast.  . mirabegron ER (MYRBETRIQ) 50 MG TB24 tablet Take 50 mg by mouth at bedtime.  . mirtazapine (REMERON) 7.5 MG tablet Take 7.5 mg by mouth at bedtime. To help with appetite  . NON FORMULARY Diet: _____ Regular,  ___x___ NAS,  _______Consistent Carbohydrate,  _______NPO  _____Other  . potassium chloride (KLOR-CON) 10 MEQ tablet Take 10 mEq by mouth daily. Do not crush. Give with food and full glass of liquid.   No facility-administered encounter medications on file as of 06/27/2019.     SIGNIFICANT DIAGNOSTIC EXAMS  LABS REVIEWED PREVIOUS  06-10-19: wbc 5.9; hgb 11.6; hct 37.2; mcv 102.8 plt 237 glucose 91; bun 50; creat 1.05 ;k+ 4.4; na++ 141; ca 9.1 liver normal albumin 3.5  NO NEW LABS.    Review of Systems  Constitutional: Negative for malaise/fatigue.  Respiratory: Negative for cough and shortness of breath.   Cardiovascular: Negative for chest pain, palpitations and leg swelling.  Gastrointestinal: Negative for abdominal pain, constipation and heartburn.  Musculoskeletal: Negative for back pain, joint pain and myalgias.  Skin: Negative.   Neurological: Negative for dizziness.  Psychiatric/Behavioral: The patient is not nervous/anxious.     Physical Exam Constitutional:      General: She is not in acute distress.    Appearance: She is well-developed. She is not diaphoretic.  Neck:     Thyroid: No thyromegaly.  Cardiovascular:     Rate and Rhythm: Normal rate and regular rhythm.     Heart sounds: Normal heart sounds.     Comments: PP faint  Pulmonary:     Effort: Pulmonary effort is normal. No respiratory distress.     Breath sounds: Normal breath sounds.  Abdominal:     General: Bowel sounds are normal. There is no distension.     Palpations: Abdomen is soft.     Tenderness: There is no abdominal tenderness.  Musculoskeletal:     Cervical back: Neck supple.     Right  lower leg: No edema.     Left lower leg: No edema.     Comments:  Is able to move all extremities Feet are stiff and sensitive to touch      Lymphadenopathy:     Cervical: No cervical adenopathy.  Skin:    General: Skin is warm and dry.     Comments: Bilateral lower extremities discolored  Neurological:     Mental Status: She is alert. Mental status is at baseline.  Psychiatric:        Mood and Affect: Mood normal.       ASSESSMENT/ PLAN:  TODAY  1. Weight loss: is worse: will begin remeron 7.5 mg nightly for 30 days and will monitor her status.   MD is aware of resident's narcotic use and is in agreement with current plan of care. We will attempt to wean resident as appropriate.  Synthia Innocent NP [facility]  Contact [phone] Monday through Friday 8am- 5pm  After hours call [phone]

## 2019-07-01 ENCOUNTER — Non-Acute Institutional Stay (SKILLED_NURSING_FACILITY): Payer: Medicare HMO | Admitting: Adult Health

## 2019-07-01 ENCOUNTER — Encounter: Payer: Self-pay | Admitting: Adult Health

## 2019-07-01 DIAGNOSIS — R627 Adult failure to thrive: Secondary | ICD-10-CM

## 2019-07-01 DIAGNOSIS — R32 Unspecified urinary incontinence: Secondary | ICD-10-CM | POA: Diagnosis not present

## 2019-07-01 DIAGNOSIS — E039 Hypothyroidism, unspecified: Secondary | ICD-10-CM

## 2019-07-01 DIAGNOSIS — R634 Abnormal weight loss: Secondary | ICD-10-CM

## 2019-07-01 NOTE — Progress Notes (Signed)
Location:    [facility] Room Number: 145/W Place of Service:  SNF (31)   CODE STATUS: Full Code  Allergies  Allergen Reactions  . Cephalexin Other (See Comments)    Pt. Doesn't remember.   . Cephalexin Other (See Comments)    Pt. Doesn't remember.   . Penicillins Other (See Comments)    Pt. Cant remember.     Chief Complaint  Patient presents with  . Medical Management of Chronic Issues       Failure to thrive in adult/weight loss    Urinary incontinence in female Acquired hypothyroidism:    Weekly follow up for the first 30 days post hospitalization.      HPI:  She is a 86 year old short term rehab patient being seen for the management of her chronic illnesses: failure to thrive weight loss; UI; hypothyroidism. Her appetite remain very poor. She is on remeron to help with appetite. There are no reports of uncontrolled pain. No reports of anxiety present.   Past Medical History:  Diagnosis Date  . Thyroid disease     Past Surgical History:  Procedure Laterality Date  . ABDOMINAL HYSTERECTOMY    . TONSILLECTOMY    . TOTAL HIP ARTHROPLASTY     left    Social History   Socioeconomic History  . Marital status: Widowed    Spouse name: Not on file  . Number of children: Not on file  . Years of education: Not on file  . Highest education level: Not on file  Occupational History  . Not on file  Tobacco Use  . Smoking status: Never Smoker  . Smokeless tobacco: Never Used  Substance and Sexual Activity  . Alcohol use: No  . Drug use: No  . Sexual activity: Not Currently  Other Topics Concern  . Not on file  Social History Narrative  . Not on file   Social Determinants of Health   Financial Resource Strain:   . Difficulty of Paying Living Expenses:   Food Insecurity:   . Worried About Programme researcher, broadcasting/film/video in the Last Year:   . Barista in the Last Year:   Transportation Needs:   . Freight forwarder (Medical):   Marland Kitchen Lack of  Transportation (Non-Medical):   Physical Activity:   . Days of Exercise per Week:   . Minutes of Exercise per Session:   Stress:   . Feeling of Stress :   Social Connections:   . Frequency of Communication with Friends and Family:   . Frequency of Social Gatherings with Friends and Family:   . Attends Religious Services:   . Active Member of Clubs or Organizations:   . Attends Banker Meetings:   Marland Kitchen Marital Status:   Intimate Partner Violence:   . Fear of Current or Ex-Partner:   . Emotionally Abused:   Marland Kitchen Physically Abused:   . Sexually Abused:    Family History  Problem Relation Age of Onset  . Heart attack Mother   . Heart attack Father       VITAL SIGNS BP 136/70   Pulse 82   Temp 98.6 F (37 C) (Oral)   Resp 20   Ht 5\' 1"  (1.549 m)   Wt 134 lb 6.4 oz (61 kg)   SpO2 90%   BMI 25.39 kg/m   Outpatient Encounter Medications as of 07/01/2019  Medication Sig  . acetaminophen (TYLENOL) 500 MG tablet 1st Order:  oral  Special Instructions: As needed for pain. Max 3gm acetaminophen/24 hrs from all sources. Every 8 Hours - PRN PRN 1, PRN 2, PRN 3   2nd Order:  amt: 2 tabs (1000mg  total); oral Special Instructions: As needed for pain. Max 3gm acetaminophen/24 hrs from all sources. Every 8 Hours - PRN PRN 1, PRN 2, PRN 3  . Chloroxylenol, Antiseptic, (PREP PROTECTIVE SKIN BARRIER EX) Apply topically. Apply skin prep to bilateral heels qshift for prevention. Every Shift Day, Evening, Night  . diclofenac Sodium (VOLTAREN) 1 % GEL Apply 2 g topically every 6 (six) hours as needed. Apply topically to bilateral knees as needed for arthritic pain.  docusate sodium (COLACE) 100 MG capsule Take 100 mg by mouth 2 (two) times daily.  . feeding supplement, ENSURE ENLIVE, (ENSURE ENLIVE) LIQD Take 237 mLs by mouth daily.  . furosemide (LASIX) 20 MG tablet Take 20 mg by mouth daily as needed. As needed for weight gain greater than 3 pounds in one day. Notify provider if  given.  Marland Kitchen glucosamine-chondroitin 500-400 MG tablet Take 1 tablet by mouth daily.  Marland Kitchen ibuprofen (ADVIL) 400 MG tablet Take 400 mg by mouth every 4 (four) hours as needed (For Pain).  Marland Kitchen levothyroxine (SYNTHROID) 100 MCG tablet Take 100 mcg by mouth daily before breakfast.  . mirabegron ER (MYRBETRIQ) 50 MG TB24 tablet Take 50 mg by mouth at bedtime.  . mirtazapine (REMERON) 7.5 MG tablet Take 7.5 mg by mouth at bedtime. To help with appetite  . NON FORMULARY Diet: _____ Regular,  ___x___ NAS,  _______Consistent Carbohydrate,  _______NPO  _____Other  . potassium chloride (KLOR-CON) 10 MEQ tablet Take 10 mEq by mouth daily. Do not crush. Give with food and full glass of liquid.   No facility-administered encounter medications on file as of 07/01/2019.     SIGNIFICANT DIAGNOSTIC EXAMS   LABS REVIEWED PREVIOUS  06-10-19: wbc 5.9; hgb 11.6; hct 37.2; mcv 102.8 plt 237 glucose 91; bun 50; creat 1.05 ;k+ 4.4; na++ 141; ca 9.1 liver normal albumin 3.5  NO NEW LABS.    Review of Systems  Constitutional: Negative for malaise/fatigue.  Respiratory: Negative for cough and shortness of breath.   Cardiovascular: Negative for chest pain, palpitations and leg swelling.  Gastrointestinal: Negative for abdominal pain, constipation and heartburn.  Musculoskeletal: Negative for back pain, joint pain and myalgias.  Skin: Negative.   Neurological: Negative for dizziness.  Psychiatric/Behavioral: The patient is not nervous/anxious.     Physical Exam Constitutional:      General: She is not in acute distress.    Appearance: She is well-developed. She is not diaphoretic.  Neck:     Thyroid: No thyromegaly.  Cardiovascular:     Rate and Rhythm: Normal rate and regular rhythm.     Heart sounds: Normal heart sounds.     Comments: PP faint  Pulmonary:     Effort: Pulmonary effort is normal. No respiratory distress.     Breath sounds: Normal breath sounds.  Abdominal:     General: Bowel sounds are  normal. There is no distension.     Palpations: Abdomen is soft.     Tenderness: There is no abdominal tenderness.  Musculoskeletal:     Cervical back: Neck supple.     Right lower leg: No edema.     Left lower leg: No edema.     Comments: Is able to move all extremities Feet are stiff and sensitive to touch      Lymphadenopathy:  Cervical: No cervical adenopathy.  Skin:    General: Skin is warm and dry.     Comments: Bilateral lower extremities discolored   Neurological:     Mental Status: She is alert. Mental status is at baseline.  Psychiatric:        Mood and Affect: Mood normal.      ASSESSMENT/ PLAN:  TODAY  1. Failure to thrive in adult/weight loss her current wight is 134 pounds will continue remeron 7.5 mg for total of 30 days appetite remains poor.   2. Urinary incontinence in female: is stable will continue mybretriq 50 mg daily   3. Acquired hypothyroidism: is stable tsh 13.006 will continue synthroid 100 mcg daily is  due for tsh in April.    PREVIOUS  4. Chronic constipation: is stable will continue colace twice daily   5. Hypokalemia: is stable k+ 4.4 will continue k+ 10 meq daily  6. CKD (chronic kidney disease) stage 2 is stable bun 50 creat 1.05     MD is aware of resident's narcotic use and is in agreement with current plan of care. We will attempt to wean resident as appropriate.  Ok Edwards NP [facility]  Contact [phone] Monday through Friday 8am- 5pm  After hours call [phone]

## 2019-07-04 ENCOUNTER — Non-Acute Institutional Stay (SKILLED_NURSING_FACILITY): Payer: Medicare HMO | Admitting: Adult Health

## 2019-07-04 ENCOUNTER — Encounter: Payer: Self-pay | Admitting: Adult Health

## 2019-07-04 ENCOUNTER — Other Ambulatory Visit (HOSPITAL_COMMUNITY)
Admission: RE | Admit: 2019-07-04 | Discharge: 2019-07-04 | Disposition: A | Discharge status: Home / Self Care | Payer: Medicare HMO | Source: Skilled Nursing Facility | Attending: Adult Health | Admitting: Adult Health

## 2019-07-04 ENCOUNTER — Emergency Department (HOSPITAL_COMMUNITY): Payer: Medicare HMO

## 2019-07-04 ENCOUNTER — Other Ambulatory Visit (HOSPITAL_COMMUNITY): Payer: Self-pay

## 2019-07-04 ENCOUNTER — Other Ambulatory Visit: Payer: Self-pay

## 2019-07-04 ENCOUNTER — Observation Stay (HOSPITAL_COMMUNITY)
Admission: EM | Admit: 2019-07-04 | Discharge: 2019-07-05 | Disposition: A | Discharge status: Home / Self Care | Payer: Medicare HMO | Attending: Internal Medicine | Admitting: Internal Medicine

## 2019-07-04 DIAGNOSIS — R627 Adult failure to thrive: Secondary | ICD-10-CM | POA: Diagnosis present

## 2019-07-04 DIAGNOSIS — R634 Abnormal weight loss: Secondary | ICD-10-CM | POA: Insufficient documentation

## 2019-07-04 DIAGNOSIS — E860 Dehydration: Secondary | ICD-10-CM | POA: Diagnosis not present

## 2019-07-04 DIAGNOSIS — Z880 Allergy status to penicillin: Secondary | ICD-10-CM | POA: Diagnosis not present

## 2019-07-04 DIAGNOSIS — L89620 Pressure ulcer of left heel, unstageable: Secondary | ICD-10-CM

## 2019-07-04 DIAGNOSIS — E039 Hypothyroidism, unspecified: Secondary | ICD-10-CM | POA: Diagnosis present

## 2019-07-04 DIAGNOSIS — F0390 Unspecified dementia without behavioral disturbance: Secondary | ICD-10-CM | POA: Insufficient documentation

## 2019-07-04 DIAGNOSIS — R000 Tachycardia, unspecified: Secondary | ICD-10-CM | POA: Diagnosis not present

## 2019-07-04 DIAGNOSIS — N179 Acute kidney failure, unspecified: Secondary | ICD-10-CM

## 2019-07-04 DIAGNOSIS — Z881 Allergy status to other antibiotic agents status: Secondary | ICD-10-CM | POA: Insufficient documentation

## 2019-07-04 DIAGNOSIS — L89610 Pressure ulcer of right heel, unstageable: Secondary | ICD-10-CM | POA: Diagnosis not present

## 2019-07-04 DIAGNOSIS — R531 Weakness: Secondary | ICD-10-CM | POA: Diagnosis present

## 2019-07-04 DIAGNOSIS — N182 Chronic kidney disease, stage 2 (mild): Secondary | ICD-10-CM | POA: Diagnosis not present

## 2019-07-04 DIAGNOSIS — M79672 Pain in left foot: Secondary | ICD-10-CM | POA: Diagnosis not present

## 2019-07-04 DIAGNOSIS — Z79899 Other long term (current) drug therapy: Secondary | ICD-10-CM | POA: Insufficient documentation

## 2019-07-04 DIAGNOSIS — Z03818 Encounter for observation for suspected exposure to other biological agents ruled out: Secondary | ICD-10-CM | POA: Diagnosis not present

## 2019-07-04 DIAGNOSIS — Z20822 Contact with and (suspected) exposure to covid-19: Secondary | ICD-10-CM | POA: Diagnosis not present

## 2019-07-04 DIAGNOSIS — R69 Illness, unspecified: Secondary | ICD-10-CM | POA: Diagnosis not present

## 2019-07-04 DIAGNOSIS — M79671 Pain in right foot: Secondary | ICD-10-CM | POA: Diagnosis not present

## 2019-07-04 DIAGNOSIS — R109 Unspecified abdominal pain: Secondary | ICD-10-CM | POA: Diagnosis not present

## 2019-07-04 DIAGNOSIS — N390 Urinary tract infection, site not specified: Secondary | ICD-10-CM

## 2019-07-04 LAB — COMPREHENSIVE METABOLIC PANEL
ALT: 65 U/L — ABNORMAL HIGH (ref 0–44)
AST: 42 U/L — ABNORMAL HIGH (ref 15–41)
Albumin: 3.2 g/dL — ABNORMAL LOW (ref 3.5–5.0)
Alkaline Phosphatase: 158 U/L — ABNORMAL HIGH (ref 38–126)
Anion gap: 14 (ref 5–15)
BUN: 97 mg/dL — ABNORMAL HIGH (ref 8–23)
CO2: 25 mmol/L (ref 22–32)
Calcium: 9 mg/dL (ref 8.9–10.3)
Chloride: 102 mmol/L (ref 98–111)
Creatinine, Ser: 2.41 mg/dL — ABNORMAL HIGH (ref 0.44–1.00)
GFR calc Af Amer: 19 mL/min — ABNORMAL LOW (ref >60)
GFR calc non Af Amer: 16 mL/min — ABNORMAL LOW (ref >60)
Glucose, Bld: 190 mg/dL — ABNORMAL HIGH (ref 70–99)
Potassium: 5.3 mmol/L — ABNORMAL HIGH (ref 3.5–5.1)
Sodium: 141 mmol/L (ref 135–145)
Total Bilirubin: 0.6 mg/dL (ref 0.3–1.2)
Total Protein: 7.5 g/dL (ref 6.5–8.1)

## 2019-07-04 LAB — URINALYSIS, ROUTINE W REFLEX MICROSCOPIC
Bilirubin Urine: NEGATIVE
Glucose, UA: NEGATIVE mg/dL
Ketones, ur: 5 mg/dL — AB
Nitrite: POSITIVE — AB
Protein, ur: 30 mg/dL — AB
Specific Gravity, Urine: 1.015 (ref 1.005–1.030)
WBC, UA: >50 WBC/hpf — ABNORMAL HIGH (ref 0–5)
pH: 5 (ref 5.0–8.0)

## 2019-07-04 LAB — CBC WITH DIFFERENTIAL/PLATELET
Abs Immature Granulocytes: 0.07 10*3/uL (ref 0.00–0.07)
Basophils Absolute: 0 10*3/uL (ref 0.0–0.1)
Basophils Relative: 0 %
Eosinophils Absolute: 0.1 10*3/uL (ref 0.0–0.5)
Eosinophils Relative: 1 %
HCT: 36.6 % (ref 36.0–46.0)
Hemoglobin: 11.5 g/dL — ABNORMAL LOW (ref 12.0–15.0)
Immature Granulocytes: 1 %
Lymphocytes Relative: 7 %
Lymphs Abs: 0.8 10*3/uL (ref 0.7–4.0)
MCH: 32.4 pg (ref 26.0–34.0)
MCHC: 31.4 g/dL (ref 30.0–36.0)
MCV: 103.1 fL — ABNORMAL HIGH (ref 80.0–100.0)
Monocytes Absolute: 1.1 10*3/uL — ABNORMAL HIGH (ref 0.1–1.0)
Monocytes Relative: 10 %
Neutro Abs: 8.3 10*3/uL — ABNORMAL HIGH (ref 1.7–7.7)
Neutrophils Relative %: 81 %
Platelets: 214 10*3/uL (ref 150–400)
RBC: 3.55 MIL/uL — ABNORMAL LOW (ref 3.87–5.11)
RDW: 14.2 % (ref 11.5–15.5)
WBC: 10.4 10*3/uL (ref 4.0–10.5)
nRBC: 0 % (ref 0.0–0.2)

## 2019-07-04 LAB — LIPASE, BLOOD: Lipase: 25 U/L (ref 11–51)

## 2019-07-04 MED ORDER — LACTATED RINGERS IV BOLUS
500.0000 mL | Freq: Once | INTRAVENOUS | Status: AC
  Administered 2019-07-04: 500 mL via INTRAVENOUS

## 2019-07-04 NOTE — Progress Notes (Signed)
Location:  [facility] Room Number: 145-W Place of Service:  SNF (31)   CODE STATUS: Full Code  Allergies  Allergen Reactions  . Cephalexin Other (See Comments)    Pt. Doesn't remember.   . Cephalexin Other (See Comments)    Pt. Doesn't remember.   . Penicillins Other (See Comments)    Pt. Cant remember.     Chief Complaint  Patient presents with  . Acute Visit    Patient is seen secondary to weight loss.    HPI:  She is more lethargic; her po intake is worse; she is complaining of pain in her feet. There are no reports of fevers. Her mouth is dry. She had been started on remeron 7.5 mg nightly for her appetite. She has bilateral heel ulcerations. She is drinking ensure but not eating food.    Past Medical History:  Diagnosis Date  . Thyroid disease     Past Surgical History:  Procedure Laterality Date  . ABDOMINAL HYSTERECTOMY    . TONSILLECTOMY    . TOTAL HIP ARTHROPLASTY     left    Social History   Socioeconomic History  . Marital status: Widowed    Spouse name: Not on file  . Number of children: Not on file  . Years of education: Not on file  . Highest education level: Not on file  Occupational History  . Not on file  Tobacco Use  . Smoking status: Never Smoker  . Smokeless tobacco: Never Used  Substance and Sexual Activity  . Alcohol use: No  . Drug use: No  . Sexual activity: Not Currently  Other Topics Concern  . Not on file  Social History Narrative  . Not on file   Social Determinants of Health   Financial Resource Strain:   . Difficulty of Paying Living Expenses:   Food Insecurity:   . Worried About Charity fundraiser in the Last Year:   . Arboriculturist in the Last Year:   Transportation Needs:   . Film/video editor (Medical):   Marland Kitchen Lack of Transportation (Non-Medical):   Physical Activity:   . Days of Exercise per Week:   . Minutes of Exercise per Session:   Stress:   . Feeling of Stress :   Social  Connections:   . Frequency of Communication with Friends and Family:   . Frequency of Social Gatherings with Friends and Family:   . Attends Religious Services:   . Active Member of Clubs or Organizations:   . Attends Archivist Meetings:   Marland Kitchen Marital Status:   Intimate Partner Violence:   . Fear of Current or Ex-Partner:   . Emotionally Abused:   Marland Kitchen Physically Abused:   . Sexually Abused:    Family History  Problem Relation Age of Onset  . Heart attack Mother   . Heart attack Father       VITAL SIGNS BP (!) 122/59   Pulse 67   Temp (!) 97.1 F (36.2 C) (Oral)   Resp 20   Ht 5' 1"  (1.549 m)   Wt 137 lb 9.6 oz (62.4 kg)   SpO2 90%   BMI 26.00 kg/m   Outpatient Encounter Medications as of 07/04/2019  Medication Sig  . acetaminophen (TYLENOL) 500 MG tablet 500-1,000 mg every 8 (eight) hours as needed.   . diclofenac Sodium (VOLTAREN) 1 % GEL Apply 2 g topically every 6 (six) hours as needed. Apply topically to bilateral  knees as needed for arthritic pain.  Marland Kitchen docusate sodium (COLACE) 100 MG capsule Take 100 mg by mouth 2 (two) times daily.  . feeding supplement, ENSURE ENLIVE, (ENSURE ENLIVE) LIQD Take 237 mLs by mouth daily.  Marland Kitchen ibuprofen (ADVIL) 400 MG tablet Take 400 mg by mouth every 4 (four) hours as needed (For Pain).  Marland Kitchen levothyroxine (SYNTHROID) 100 MCG tablet Take 100 mcg by mouth daily before breakfast.  . mirabegron ER (MYRBETRIQ) 50 MG TB24 tablet Take 50 mg by mouth at bedtime.  . NON FORMULARY Diet: _____ Regular,  ___x___ NAS,  _______Consistent Carbohydrate,  _______NPO  _____Other  . [DISCONTINUED] Chloroxylenol, Antiseptic, (PREP PROTECTIVE SKIN BARRIER EX) Apply topically. Apply skin prep to bilateral heels qshift for prevention. Every Shift Day, Evening, Night  . [DISCONTINUED] furosemide (LASIX) 20 MG tablet Take 20 mg by mouth daily as needed. As needed for weight gain greater than 3 pounds in one day. Notify provider if given.  .  [DISCONTINUED] glucosamine-chondroitin 500-400 MG tablet Take 1 tablet by mouth daily.  . [DISCONTINUED] mirtazapine (REMERON) 7.5 MG tablet Take 7.5 mg by mouth at bedtime. To help with appetite  . [DISCONTINUED] potassium chloride (KLOR-CON) 10 MEQ tablet Take 10 mEq by mouth daily. Do not crush. Give with food and full glass of liquid.   No facility-administered encounter medications on file as of 07/04/2019.     SIGNIFICANT DIAGNOSTIC EXAMS   LABS REVIEWED PREVIOUS  06-10-19: wbc 5.9; hgb 11.6; hct 37.2; mcv 102.8 plt 237 glucose 91; bun 50; creat 1.05 ;k+ 4.4; na++ 141; ca 9.1 liver normal albumin 3.5  TODAY  07-04-19: wbc 10.4; hgb 11.5; hct 36.6; mcv 103.1 plt 214; glucose 190; bun 97; creat 2.41; k+ 5.3; na++ 141; ca 9.0 ast 42; alt 65; alk phos 158; albumin 3.2     Review of Systems  Unable to perform ROS: Medical condition   Physical Exam Constitutional:      General: She is not in acute distress.    Appearance: She is well-developed. She is not diaphoretic.  Neck:     Thyroid: No thyromegaly.  Cardiovascular:     Rate and Rhythm: Normal rate and regular rhythm.     Heart sounds: Normal heart sounds.     Comments: Unable to palpate pedal or post tib pulses  Pulmonary:     Effort: Pulmonary effort is normal. No respiratory distress.     Breath sounds: Normal breath sounds.  Abdominal:     General: Bowel sounds are normal. There is no distension.     Palpations: Abdomen is soft.     Tenderness: There is no abdominal tenderness.  Musculoskeletal:     Cervical back: Neck supple.     Right lower leg: No edema.     Left lower leg: No edema.  Lymphadenopathy:     Cervical: No cervical adenopathy.  Skin:    General: Skin is warm and dry.     Comments: Left heel: 3.5 x 3.8 cm Right heel: 7.0 x 5.0 cm  Neurological:     Mental Status: She is oriented to person, place, and time.        ASSESSMENT/ PLAN:  TODAY  1. Unstaged bilateral heel ulcerations 2. Acute  renal failure  Will begin 1/2 NS at 75 cc per hour Will stop remeron Will treat heels per protocol  Will get x-ray of heels Will setup ABI Will insert picc line.   MD is aware of resident's narcotic use and is in agreement  with current plan of care. We will attempt to wean resident as appropriate.  Ok Edwards NP [facility]  Contact [phone] Monday through Friday 8am- 5pm  After hours call [phone]

## 2019-07-04 NOTE — ED Provider Notes (Addendum)
[facility] EMERGENCY DEPARTMENT Provider Note   CSN: [id number] Arrival date & time: 07/04/19  1939     History Chief Complaint  Patient presents with  . Weakness    Pain all over    Kimberly Velazquez is a 86 y.o. female.  HPI Level 5 caveat due to dementia.  Reportedly brought in from [facility] because she was hurting all over while calling out on the toilet.  Reportedly feeling weak.  No injury.  However reviewing more records has had an acute kidney injury on blood work today.  Patient's son states she was sent in for dehydration.  No fall.  She is at [facility] for almost a month now for failure to thrive.  Per the patient's son who is at the bedside she has had decreased oral intake.    Past Medical History:  Diagnosis Date  . Thyroid disease     Patient Active Problem List   Diagnosis Date Noted  . Weight loss 07/04/2019  . Urinary incontinence in female 06/11/2019  . Chronic constipation 06/11/2019  . Hypokalemia 06/11/2019  . Failure to thrive in adult 06/11/2019  . Hip pain, chronic 12/11/2014  . Hypothyroidism 11/08/2014  . Low back pain 11/08/2014  . Fall 11/08/2014  . CKD (chronic kidney disease) stage 2, GFR 60-89 ml/min 11/08/2014    Past Surgical History:  Procedure Laterality Date  . ABDOMINAL HYSTERECTOMY    . TONSILLECTOMY    . TOTAL HIP ARTHROPLASTY     left     OB History   No obstetric history on file.     Family History  Problem Relation Age of Onset  . Heart attack Mother   . Heart attack Father     Social History   Tobacco Use  . Smoking status: Never Smoker  . Smokeless tobacco: Never Used  Substance Use Topics  . Alcohol use: No  . Drug use: No    Home Medications Prior to Admission medications   Medication Sig Start Date End Date Taking? Authorizing Provider  diclofenac Sodium (VOLTAREN) 1 % GEL Apply 2 g topically every 6 (six) hours as needed. Apply topically to bilateral knees as needed for arthritic pain.   Yes  [provider]  docusate sodium (COLACE) 100 MG capsule Take 100 mg by mouth 2 (two) times daily.   Yes [provider]  feeding supplement, ENSURE ENLIVE, (ENSURE ENLIVE) LIQD Take 237 mLs by mouth daily.   Yes [provider]  ibuprofen (ADVIL) 400 MG tablet Take 400 mg by mouth every 4 (four) hours as needed (For Pain).   Yes [provider]  levothyroxine (SYNTHROID) 100 MCG tablet Take 100 mcg by mouth daily before breakfast.   Yes [provider]  mirabegron ER (MYRBETRIQ) 50 MG TB24 tablet Take 50 mg by mouth at bedtime.   Yes [provider]  NON FORMULARY Diet: _____ Regular,  ___x___ NAS,  _______Consistent Carbohydrate,  _______NPO  _____Other   Yes [provider]  acetaminophen (TYLENOL) 500 MG tablet 500-1,000 mg every 8 (eight) hours as needed.     [provider]    Allergies    Cephalexin, Cephalexin, and Penicillins  Review of Systems   Review of Systems  Unable to perform ROS: Dementia    Physical Exam Updated Vital Signs BP (!) 102/54   Pulse (!) 38   Temp 98.8 F (37.1 C) (Oral)   Resp 16   Ht 5' 1"  (1.549 m)   Wt  62 kg   SpO2 93%   BMI 25.83 kg/m   Physical Exam Vitals reviewed.  Constitutional:      Appearance: Normal appearance.  HENT:     Head: Normocephalic.  Eyes:     Extraocular Movements: Extraocular movements intact.  Pulmonary:     Breath sounds: No wheezing, rhonchi or rales.  Abdominal:     Tenderness: There is no abdominal tenderness.  Musculoskeletal:        General: No tenderness.  Skin:    General: Skin is warm.     Capillary Refill: Capillary refill takes less than 2 seconds.  Neurological:     Mental Status: She is alert.     Comments: Awake and pleasant, but some confusion.     ED Results / Procedures / Treatments   Labs (all labs ordered are listed, but only abnormal results are displayed) Labs Reviewed  URINALYSIS, ROUTINE W REFLEX  MICROSCOPIC - Abnormal; Notable for the following components:      Result Value   APPearance HAZY (*)    Hgb urine dipstick SMALL (*)    Ketones, ur 5 (*)    Protein, ur 30 (*)    Nitrite POSITIVE (*)    Leukocytes,Ua LARGE (*)    WBC, UA >50 (*)    Bacteria, UA RARE (*)    All other components within normal limits  URINE CULTURE  LIPASE, BLOOD    EKG None  Radiology No results found.  Procedures Procedures (including critical care time)  Medications Ordered in ED Medications  lactated ringers bolus 500 mL (0 mLs Intravenous Stopped 07/04/19 2151)  lactated ringers bolus 500 mL (500 mLs Intravenous New Bag/Given 07/04/19 2256)    ED Course  I have reviewed the triage vital signs and the nursing notes.  Pertinent labs & imaging results that were available during my care of the patient were reviewed by me and considered in my medical decision making (see chart for details).    MDM Rules/Calculators/A&P                     Patient sent from nursing home.  Has acute kidney injury.  Per son has had decreased oral intake.  Creatinine has gone from 1 a month ago to 2.4 today.  Mild hyperglycemia.  Potassium also minimally elevated at 5.3.  LFTs and alk phos mildly elevated but normal lipase.  Patient son states she has not been eating and this is verified byProvider notes in the chart.  Will get acute abdominal series but will require admission the hospital.   Urinalysis shows white cells and leukocyte esterase and nitrite.  Culture sent. Final Clinical Impression(s) / ED Diagnoses Final diagnoses:  Dehydration  AKI (acute kidney injury) [facility])    Rx / [place] Orders ED Discharge Orders    None       Davonna Belling, MD 07/04/19 2297    Davonna Belling, MD 07/04/19 2314

## 2019-07-04 NOTE — ED Triage Notes (Signed)
Pt is from the [facility], found today on the commode, calling out for help.  Pt normally alert, oriented, ambulatory.  Pt now states she "hurts all over", denies recent fall or injury, states she also feels weak.  Pt is alert, oriented at this time.

## 2019-07-04 NOTE — H&P (Addendum)
History and Physical    Patient Demographics:    Kimberly Velazquez YBO:[id number] DOB: [birth year] DOA: 07/04/2019  PCP: Benita Stabile, MD  Patient coming from: [facility]  I have personally briefly reviewed patient's old medical records in [facility]  Chief Complaint: Generalized weakness, abnormal labs   Assessment & Plan:     Assessment/Plan Principal Problem:   Acute renal failure (ARF) (HCC) Active Problems:   Hypothyroidism   CKD (chronic kidney disease) stage 2, GFR 60-89 ml/min   Failure to thrive in adult   UTI (urinary tract infection)     Principal Problem: Acute on chronic renal failure with baseline CKD stage II Patient presented with a creatinine of 2.4, baseline appears to be somewhere around 1-1.3.  Most recent creatinine was 1.05 on 06/10/2019.  Significantly elevated BUN also noted.  Probably prerenal etiology due to dehydration secondary to decreased p.o. intake -IV fluid resuscitation -Monitor input output, renal function  Other Active Problems: Abnormal urinalysis, possible urinary tract infection -We will place on IV antibiotics and follow urine culture  Failure to thrive with acute on chronic physical deconditioning Patient resides at the [facility].  Has been having worsening physical decline and decreased p.o. intake over the last several weeks. -We will get PT/OT evaluation.    Dementia Patient has mild cognitive impairment at baseline.  Currently mental status is unchanged. -Stable, follow-up as outpatient  Hypothyroidism -Continue levothyroxine.  TSH noted to be elevated to 13 on 06/10/2019.  We will get free T4 level.  Mild macrocytic anemia Hemoglobin 11.5 on presentation with elevated MCV.  Noted to be at baseline. We will send anemia work-up  Bilateral Heel Pressure Ulcers with early cellulitis  -Wound care consult  -Heel protectors with wound care -will place on antibiotics  DVT prophylaxis: Heparin Code Status: DNR Family  Communication: N/A  Disposition Plan: Placed in observation, admitted for treatment of mild acute renal failure as well as possible UTI. Consults called: N/A Admission status: Observation stay    HPI:     HPI: ELLSIE Velazquez is a 86 y.o. female with medical history significant of dementia, hypothyroidism, anemia, failure to thrive with resident at the [facility] and sent into the ER for further evaluation due to worsening weakness.  History has been obtained from the ER record as the patient has dementia and is a poor historian.  Patient has been having failure to thrive with decreased p.o. intake and overall physical decline over the last several weeks.  She was independent at home until about 4 weeks ago and then placed in the [facility].  She has been having decreased appetite and has been offered Ensure at the nursing home but has had difficulty with keeping a good intake.  No nausea, vomiting reported.  No diarrhea, constipation.  No fever, chills, dysuria reported.  No chest pain, shortness of breath, palpitations, dizziness, lightheadedness.  Walks with a walker at baseline.  Has developed pressure ulcers on both heels. ED Course:  Vital Signs reviewed on presentation, significant for temperature 98.8, heart rate 91, blood pressure 135/51, saturation 95% on room air. Labs reviewed, significant for sodium 141, potassium 5.3, chloride 102, BUN 97, creatinine 2.41, albumin 3.2, alkaline phosphatase 158, AST 42, ALT 65, WBC count 10.4, hemoglobin 11.5, hematocrit 36, MCV 103, platelets 214.  Urinalysis shows more than 50 WBCs, large leukocytes, positive nitrites. Imaging personally Reviewed, chest x-ray shows no acute cardiopulmonary disease. EKG personally reviewed, shows sinus rhythm, ventricular bigeminy, multiple  PVCs.    Review of systems:    Review of Systems: As per HPI otherwise 10 point review of systems negative.  All other review of systems is negative except the ones noted above  in the HPI.    Past Medical and Surgical History:  Reviewed by me  Past Medical History:  Diagnosis Date  . Thyroid disease     Past Surgical History:  Procedure Laterality Date  . ABDOMINAL HYSTERECTOMY    . TONSILLECTOMY    . TOTAL HIP ARTHROPLASTY     left     Social History:  Reviewed by me   reports that she has never smoked. She has never used smokeless tobacco. She reports that she does not drink alcohol or use drugs.  Allergies:    Allergies  Allergen Reactions  . Cephalexin Other (See Comments)    Pt. Doesn't remember.   . Cephalexin Other (See Comments)    Pt. Doesn't remember.   . Penicillins Other (See Comments)    Pt. Cant remember.     Family History :   Family History  Problem Relation Age of Onset  . Heart attack Mother   . Heart attack Father    Family history reviewed, noted as above, not pertinent to current presentation.   Home Medications:    Prior to Admission medications   Medication Sig Start Date End Date Taking? Authorizing Provider  diclofenac Sodium (VOLTAREN) 1 % GEL Apply 2 g topically every 6 (six) hours as needed. Apply topically to bilateral knees as needed for arthritic pain.   Yes [provider]  docusate sodium (COLACE) 100 MG capsule Take 100 mg by mouth 2 (two) times daily.   Yes [provider]  feeding supplement, ENSURE ENLIVE, (ENSURE ENLIVE) LIQD Take 237 mLs by mouth daily.   Yes [provider]  ibuprofen (ADVIL) 400 MG tablet Take 400 mg by mouth every 4 (four) hours as needed (For Pain).   Yes [provider]  levothyroxine (SYNTHROID) 100 MCG tablet Take 100 mcg by mouth daily before breakfast.   Yes [provider]  mirabegron ER (MYRBETRIQ) 50 MG TB24 tablet Take 50 mg by mouth at bedtime.   Yes [provider]  NON FORMULARY Diet: _____ Regular,  ___x___ NAS,  _______Consistent Carbohydrate,  _______NPO  _____Other   Yes [provider]    acetaminophen (TYLENOL) 500 MG tablet 500-1,000 mg every 8 (eight) hours as needed.     [provider]    Physical Exam:    Physical Exam: Vitals:   07/04/19 2100 07/04/19 2130 07/04/19 2200 07/04/19 2230  BP: 139/68 (!) 125/51 (!) 102/54 (!) 135/51  Pulse: (!) 52 (!) 55 (!) 38 (!) 43  Resp: 12 17 16 16   Temp:      TempSrc:      SpO2: 93% 97% 93% 95%  Weight:      Height:        Constitutional: NAD, calm, comfortable Vitals:   07/04/19 2100 07/04/19 2130 07/04/19 2200 07/04/19 2230  BP: 139/68 (!) 125/51 (!) 102/54 (!) 135/51  Pulse: (!) 52 (!) 55 (!) 38 (!) 43  Resp: 12 17 16 16   Temp:      TempSrc:      SpO2: 93% 97% 93% 95%  Weight:      Height:       Eyes: PERRL, lids and conjunctivae normal ENMT: Mucous membranes are dry. Posterior pharynx clear of any exudate or lesions.Normal dentition.  Neck:  normal, supple, no masses, no thyromegaly Respiratory: clear to auscultation bilaterally, no wheezing, no crackles. Normal respiratory effort. No accessory muscle use.  Cardiovascular: Irregular rhythm with skipped beats, no murmurs / rubs / gallops. No extremity edema. 2+ pedal pulses. No carotid bruits.  Abdomen: no tenderness, no masses palpated. No hepatosplenomegaly. Bowel sounds positive.  Musculoskeletal: no clubbing / cyanosis. No joint deformity upper and lower extremities. Good ROM, no contractures. Normal muscle tone.  Skin: Patient has stage II pressure ulcers on both heels with mild redness, increased sensitivity and tenderness to touch with mild increased warmth. Neurologic: No obvious cranial nerve deficits, mild generalized weakness both lower extremities, no focal neurological deficits. Psychiatric: Judgment and thought content is impaired.   Decubitus Ulcers: Not present on admission Catheters and tubes: None  Data Review:    Labs on Admission: I have personally reviewed following labs and imaging studies  CBC: Recent Labs  Lab  07/04/19 1155  WBC 10.4  NEUTROABS 8.3*  HGB 11.5*  HCT 36.6  MCV 103.1*  PLT 214   Basic Metabolic Panel: Recent Labs  Lab 07/04/19 1123  NA 141  K 5.3*  CL 102  CO2 25  GLUCOSE 190*  BUN 97*  CREATININE 2.41*  CALCIUM 9.0   GFR: Estimated Creatinine Clearance: 11 mL/min (A) (by C-G formula based on SCr of 2.41 mg/dL (H)). Liver Function Tests: Recent Labs  Lab 07/04/19 1123  AST 42*  ALT 65*  ALKPHOS 158*  BILITOT 0.6  PROT 7.5  ALBUMIN 3.2*   Recent Labs  Lab 07/04/19 2050  LIPASE 25   No results for input(s): AMMONIA in the last 168 hours. Coagulation Profile: No results for input(s): INR, PROTIME in the last 168 hours. Cardiac Enzymes: No results for input(s): CKTOTAL, CKMB, CKMBINDEX, TROPONINI in the last 168 hours. BNP (last 3 results) No results for input(s): PROBNP in the last 8760 hours. HbA1C: No results for input(s): HGBA1C in the last 72 hours. CBG: No results for input(s): GLUCAP in the last 168 hours. Lipid Profile: No results for input(s): CHOL, HDL, LDLCALC, TRIG, CHOLHDL, LDLDIRECT in the last 72 hours. Thyroid Function Tests: No results for input(s): TSH, T4TOTAL, FREET4, T3FREE, THYROIDAB in the last 72 hours. Anemia Panel: No results for input(s): VITAMINB12, FOLATE, FERRITIN, TIBC, IRON, RETICCTPCT in the last 72 hours. Urine analysis:    Component Value Date/Time   COLORURINE YELLOW 07/04/2019 2248   APPEARANCEUR HAZY (A) 07/04/2019 2248   LABSPEC 1.015 07/04/2019 2248   PHURINE 5.0 07/04/2019 2248   GLUCOSEU NEGATIVE 07/04/2019 2248   HGBUR SMALL (A) 07/04/2019 2248   BILIRUBINUR NEGATIVE 07/04/2019 2248   KETONESUR 5 (A) 07/04/2019 2248   PROTEINUR 30 (A) 07/04/2019 2248   UROBILINOGEN 0.2 01/18/2010 1006   NITRITE POSITIVE (A) 07/04/2019 2248   LEUKOCYTESUR LARGE (A) 07/04/2019 2248     Imaging Results:      Radiological Exams on Admission: No results found.    Olga Coaster MD [facility]  If  7PM-7AM, please contact night-coverage   07/04/2019, 11:19 PM

## 2019-07-05 ENCOUNTER — Inpatient Hospital Stay
Admission: RE | Admit: 2019-07-05 | Discharge: 2022-05-02 | Disposition: A | Discharge status: Skilled Nursing Facility | Payer: Medicare HMO | Source: Ambulatory Visit | Attending: Internal Medicine | Admitting: Internal Medicine

## 2019-07-05 DIAGNOSIS — N182 Chronic kidney disease, stage 2 (mild): Secondary | ICD-10-CM | POA: Diagnosis not present

## 2019-07-05 DIAGNOSIS — N179 Acute kidney failure, unspecified: Secondary | ICD-10-CM | POA: Diagnosis not present

## 2019-07-05 DIAGNOSIS — R69 Illness, unspecified: Secondary | ICD-10-CM | POA: Diagnosis not present

## 2019-07-05 LAB — CBC
HCT: 34.3 % — ABNORMAL LOW (ref 36.0–46.0)
Hemoglobin: 10.6 g/dL — ABNORMAL LOW (ref 12.0–15.0)
MCH: 32.3 pg (ref 26.0–34.0)
MCHC: 30.9 g/dL (ref 30.0–36.0)
MCV: 104.6 fL — ABNORMAL HIGH (ref 80.0–100.0)
Platelets: 190 10*3/uL (ref 150–400)
RBC: 3.28 MIL/uL — ABNORMAL LOW (ref 3.87–5.11)
RDW: 14.2 % (ref 11.5–15.5)
WBC: 7.8 10*3/uL (ref 4.0–10.5)
nRBC: 0 % (ref 0.0–0.2)

## 2019-07-05 LAB — BASIC METABOLIC PANEL
Anion gap: 12 (ref 5–15)
BUN: 75 mg/dL — ABNORMAL HIGH (ref 8–23)
CO2: 25 mmol/L (ref 22–32)
Calcium: 8.6 mg/dL — ABNORMAL LOW (ref 8.9–10.3)
Chloride: 107 mmol/L (ref 98–111)
Creatinine, Ser: 1.57 mg/dL — ABNORMAL HIGH (ref 0.44–1.00)
GFR calc Af Amer: 31 mL/min — ABNORMAL LOW (ref >60)
GFR calc non Af Amer: 27 mL/min — ABNORMAL LOW (ref >60)
Glucose, Bld: 90 mg/dL (ref 70–99)
Potassium: 4.3 mmol/L (ref 3.5–5.1)
Sodium: 144 mmol/L (ref 135–145)

## 2019-07-05 LAB — RETICULOCYTES
Immature Retic Fract: 22.1 % — ABNORMAL HIGH (ref 2.3–15.9)
RBC.: 3.59 MIL/uL — ABNORMAL LOW (ref 3.87–5.11)
Retic Count, Absolute: 48.1 10*3/uL (ref 19.0–186.0)
Retic Ct Pct: 1.3 % (ref 0.4–3.1)

## 2019-07-05 LAB — IRON AND TIBC
Iron: 23 ug/dL — ABNORMAL LOW (ref 28–170)
Saturation Ratios: 9 % — ABNORMAL LOW (ref 10.4–31.8)
TIBC: 260 ug/dL (ref 250–450)
UIBC: 237 ug/dL

## 2019-07-05 LAB — T4, FREE: Free T4: 1.66 ng/dL — ABNORMAL HIGH (ref 0.61–1.12)

## 2019-07-05 LAB — TSH: TSH: 1.382 u[IU]/mL (ref 0.350–4.500)

## 2019-07-05 LAB — MAGNESIUM: Magnesium: 2.8 mg/dL — ABNORMAL HIGH (ref 1.7–2.4)

## 2019-07-05 LAB — VITAMIN B12: Vitamin B-12: 1014 pg/mL — ABNORMAL HIGH (ref 180–914)

## 2019-07-05 LAB — FERRITIN: Ferritin: 153 ng/mL (ref 11–307)

## 2019-07-05 LAB — FOLATE: Folate: 53 ng/mL (ref >5.9)

## 2019-07-05 LAB — SARS CORONAVIRUS 2 (TAT 6-24 HRS): SARS Coronavirus 2: NEGATIVE

## 2019-07-05 MED ORDER — LEVOFLOXACIN IN D5W 500 MG/100ML IV SOLN
500.0000 mg | INTRAVENOUS | Status: DC
  Administered 2019-07-05: 500 mg via INTRAVENOUS
  Filled 2019-07-05: qty 100

## 2019-07-05 MED ORDER — DOCUSATE SODIUM 100 MG PO CAPS
100.0000 mg | ORAL_CAPSULE | Freq: Two times a day (BID) | ORAL | Status: DC
  Administered 2019-07-05: 100 mg via ORAL
  Filled 2019-07-05: qty 1

## 2019-07-05 MED ORDER — SODIUM CHLORIDE 0.9 % IV SOLN: INTRAVENOUS | Status: DC

## 2019-07-05 MED ORDER — ACETAMINOPHEN 325 MG PO TABS: 650.0000 mg | ORAL_TABLET | Freq: Four times a day (QID) | ORAL | Status: DC | PRN

## 2019-07-05 MED ORDER — SODIUM CHLORIDE 0.9 % IV SOLN: 250.0000 mL | INTRAVENOUS | Status: DC | PRN

## 2019-07-05 MED ORDER — TRAMADOL HCL 50 MG PO TABS
50.0000 mg | ORAL_TABLET | Freq: Two times a day (BID) | ORAL | Status: DC | PRN
  Administered 2019-07-05: 50 mg via ORAL
  Filled 2019-07-05 (×2): qty 1

## 2019-07-05 MED ORDER — SODIUM CHLORIDE 0.9% FLUSH
3.0000 mL | Freq: Two times a day (BID) | INTRAVENOUS | Status: DC
  Administered 2019-07-05: 3 mL via INTRAVENOUS

## 2019-07-05 MED ORDER — SODIUM CHLORIDE 0.9% FLUSH: 3.0000 mL | INTRAVENOUS | Status: DC | PRN

## 2019-07-05 MED ORDER — POLYETHYLENE GLYCOL 3350 17 G PO PACK: 17.0000 g | PACK | Freq: Every day | ORAL | Status: DC | PRN

## 2019-07-05 MED ORDER — ACETAMINOPHEN 650 MG RE SUPP: 650.0000 mg | Freq: Four times a day (QID) | RECTAL | Status: DC | PRN

## 2019-07-05 MED ORDER — LEVOTHYROXINE SODIUM 100 MCG PO TABS
100.0000 ug | ORAL_TABLET | Freq: Every day | ORAL | Status: DC
  Administered 2019-07-05: 100 ug via ORAL
  Filled 2019-07-05: qty 1

## 2019-07-05 MED ORDER — HEPARIN SODIUM (PORCINE) 5000 UNIT/ML IJ SOLN
5000.0000 [IU] | Freq: Three times a day (TID) | INTRAMUSCULAR | Status: DC
  Administered 2019-07-05: 5000 [IU] via SUBCUTANEOUS
  Filled 2019-07-05 (×2): qty 1

## 2019-07-05 MED ORDER — TRAMADOL HCL 50 MG PO TABS: 50.0000 mg | ORAL_TABLET | Freq: Three times a day (TID) | ORAL | Status: DC | PRN

## 2019-07-05 MED ORDER — LEVOFLOXACIN 500 MG PO TABS: 500.0000 mg | ORAL_TABLET | ORAL | 0 refills | Status: AC

## 2019-07-05 MED ORDER — ENSURE ENLIVE PO LIQD
237.0000 mL | Freq: Three times a day (TID) | ORAL | Status: DC
  Administered 2019-07-05 (×2): 237 mL via ORAL
  Filled 2019-07-05 (×2): qty 237

## 2019-07-05 MED ORDER — MIRABEGRON ER 25 MG PO TB24: 50.0000 mg | ORAL_TABLET | Freq: Every day | ORAL | Status: DC

## 2019-07-05 MED ORDER — SULFAMETHOXAZOLE-TRIMETHOPRIM 800-160 MG PO TABS: 1.0000 | ORAL_TABLET | Freq: Two times a day (BID) | ORAL | Status: DC

## 2019-07-05 MED ORDER — TRAMADOL HCL 50 MG PO TABS: 50.0000 mg | ORAL_TABLET | Freq: Two times a day (BID) | ORAL | 0 refills | Status: DC | PRN

## 2019-07-05 MED ORDER — ONDANSETRON HCL 4 MG/2ML IJ SOLN: 4.0000 mg | Freq: Four times a day (QID) | INTRAMUSCULAR | Status: DC | PRN

## 2019-07-05 MED ORDER — BISACODYL 10 MG RE SUPP: 10.0000 mg | Freq: Every day | RECTAL | Status: DC | PRN

## 2019-07-05 MED ORDER — ONDANSETRON HCL 4 MG PO TABS: 4.0000 mg | ORAL_TABLET | Freq: Four times a day (QID) | ORAL | Status: DC | PRN

## 2019-07-05 NOTE — Evaluation (Signed)
Occupational Therapy Evaluation Patient Details Name: Kimberly Velazquez MRN: [id number] DOB: 1919-09-21 Today's Date: 07/05/2019    History of Present Illness Kimberly Velazquez is a 86 y.o. female with medical history significant of dementia, hypothyroidism, anemia, failure to thrive with resident at the [facility] and sent into the ER for further evaluation due to worsening weakness.  History has been obtained from the ER record as the patient has dementia and is a poor historian.  Patient has been having failure to thrive with decreased p.o. intake and overall physical decline over the last several weeks.  She was independent at home until about 4 weeks ago and then placed in the [facility].  She has been having decreased appetite and has been offered Ensure at the nursing home but has had difficulty with keeping a good intake.  No nausea, vomiting reported.  No diarrhea, constipation.  No fever, chills, dysuria reported.  No chest pain, shortness of breath, palpitations, dizziness, lightheadedness.  Walks with a walker at baseline.  Has developed pressure ulcers on both heels.   Clinical Impression   Pt asking for help due to pain upon OT entry into room, pt unable to specify where pain is. Pt emotional and confused during session, reports she does not walk but uses a wheelchair. Pt stating she has been in a bed for a while, OT notes pressure sores on bilateral heels with drainage. Pt requiring set up for feeding and grooming tasks, max to total assist for LB ADL completion. Pt limited due to BLE pain and generalized weakness, unable to complete any tasks in standing due to pain/weakness/fatigue. Pt able to sit in chair, heels floated on pillows at end of session. Recommend OT evaluation on return to SNF facility to determine baseline in normal environment. No further acute care OT services required at this time.     Follow Up Recommendations  SNF    Equipment Recommendations  None recommended by OT        Precautions / Restrictions Precautions Precautions: Fall Precaution Comments: pressure sores bilateral heels Restrictions Weight Bearing Restrictions: No      Mobility Bed Mobility Overal bed mobility: Needs Assistance Bed Mobility: Sit to Supine       Sit to supine: Mod assist;Max assist   General bed mobility comments: slow labored movement with difficulty moving BLE due to c/o increased pain  Transfers Overall transfer level: Needs assistance Equipment used: Rolling walker (2 wheeled) Transfers: Sit to/from [facility] Sit to Stand: Mod assist Stand pivot transfers: Mod assist       General transfer comment: Defer to PT note        ADL either performed or assessed with clinical judgement   ADL Overall ADL's : Needs assistance/impaired Eating/Feeding: Set up;Sitting Eating/Feeding Details (indicate cue type and reason): Pt using RUE to bring muffin and cup with straw to mouth.  Grooming: Dance movement psychotherapist;Set up;Bed level Grooming Details (indicate cue type and reason): Pt provided with washcloth, able to wash face with BUE without difficulty             Lower Body Dressing: Total assistance;Bed level Lower Body Dressing Details (indicate cue type and reason): total assist to donn socks  Toilet Transfer: Moderate assistance;Stand-pivot;RW Toilet Transfer Details (indicate cue type and reason): simulated with bed to chair transfer           General ADL Comments: Pt requiring significant assistance with ADLs due to generalized weakness and pain throughout LEs, suspect at  baseline     Vision Baseline Vision/History: No visual deficits Patient Visual Report: No change from baseline Vision Assessment?: No apparent visual deficits            Pertinent Vitals/Pain Pain Assessment: Faces Faces Pain Scale: Hurts whole lot Pain Location: BLE especially heels due to pressure sores Pain Descriptors / Indicators:  Sore;Discomfort;Grimacing;Guarding;Tender Pain Intervention(s): Limited activity within patient's tolerance;Monitored during session;Repositioned     Hand Dominance Right   Extremity/Trunk Assessment Upper Extremity Assessment Upper Extremity Assessment: RUE deficits/detail;Generalized weakness RUE Deficits / Details: RUE ROM limited to <50%, significant crepitus noted  RUE Sensation: WNL RUE Coordination: WNL   Lower Extremity Assessment Lower Extremity Assessment: Defer to PT evaluation   Cervical / Trunk Assessment Cervical / Trunk Assessment: Normal   Communication Communication Communication: No difficulties   Cognition Arousal/Alertness: Awake/alert Behavior During Therapy: WFL for tasks assessed/performed;Anxious Overall Cognitive Status: No family/caregiver present to determine baseline cognitive functioning                                 General Comments: Pt emotional during session, confused. Pt reports she does not walk              Home Living Family/patient expects to be discharged to:: Skilled nursing facility                             Home Equipment: Walker - 2 wheels          Prior Functioning/Environment Level of Independence: Needs assistance  Gait / Transfers Assistance Needed: Per chart review: Supervised household ambulaor using RW; Per pt: uses wheelchair ADL's / Homemaking Assistance Needed: assisted by SNF staff for ADLs            OT Problem List: Decreased strength;Decreased activity tolerance;Impaired balance (sitting and/or standing);Decreased cognition;Decreased safety awareness;Decreased knowledge of use of DME or AE;Pain      OT Treatment/Interventions:      OT Goals(Current goals can be found in the care plan section) Acute Rehab OT Goals Patient Stated Goal: return home  OT Frequency:      AM-PAC OT "6 Clicks" Daily Activity     Outcome Measure Help from another person eating meals?: A  Little Help from another person taking care of personal grooming?: A Little Help from another person toileting, which includes using toliet, bedpan, or urinal?: A Lot Help from another person bathing (including washing, rinsing, drying)?: A Lot Help from another person to put on and taking off regular upper body clothing?: A Lot Help from another person to put on and taking off regular lower body clothing?: A Lot 6 Click Score: 14   End of Session Equipment Utilized During Treatment: Gait belt;Rolling walker Nurse Communication: Mobility status;Other (comment)(up in chair eating breakfast, would like ensure)  Activity Tolerance: Patient limited by pain Patient left: in chair;with call bell/phone within reach  OT Visit Diagnosis: Muscle weakness (generalized) (M62.81)                Time: 7619-5093 OT Time Calculation (min): 34 min Charges:  OT General Charges $OT Visit: 1 Visit OT Evaluation $OT Eval Moderate Complexity: 1 [address] OTR/L  [phone] 07/05/2019, 10:41 AM

## 2019-07-05 NOTE — Progress Notes (Signed)
Called report to [place] at [facility].

## 2019-07-05 NOTE — Evaluation (Signed)
Physical Therapy Evaluation Patient Details Name: Kimberly Velazquez MRN: [id number] DOB: 20-Oct-1919 Today's Date: 07/05/2019   History of Present Illness  Kimberly Velazquez is a 86 y.o. female with medical history significant of dementia, hypothyroidism, anemia, failure to thrive with resident at the [facility] and sent into the ER for further evaluation due to worsening weakness.  History has been obtained from the ER record as the patient has dementia and is a poor historian.  Patient has been having failure to thrive with decreased p.o. intake and overall physical decline over the last several weeks.  She was independent at home until about 4 weeks ago and then placed in the [facility].  She has been having decreased appetite and has been offered Ensure at the nursing home but has had difficulty with keeping a good intake.  No nausea, vomiting reported.  No diarrhea, constipation.  No fever, chills, dysuria reported.  No chest pain, shortness of breath, palpitations, dizziness, lightheadedness.  Walks with a walker at baseline.  Has developed pressure ulcers on both heels.    Clinical Impression  Patient c/o severe pain BLE with movement and pressure, presents with heels sores and drainage coming from left heel.  Patient demonstrates slow labored movement for sitting up at bedside requiring Mod assistance to move BLE, limited to a few side steps at bedside due to weakness and BLE pain.  Patient tolerated sitting up in chair after therapy - nursing staff notified.  Patient will benefit from continued physical therapy in hospital and recommended venue below to increase strength, balance, endurance for safe ADLs and gait.      Follow Up Recommendations SNF    Equipment Recommendations  None recommended by PT    Recommendations for Other Services       Precautions / Restrictions Precautions Precautions: Fall Precaution Comments: pressure sores bilateral heels Restrictions Weight Bearing  Restrictions: No      Mobility  Bed Mobility Overal bed mobility: Needs Assistance Bed Mobility: Sit to Supine       Sit to supine: Mod assist;Max assist   General bed mobility comments: slow labored movement with difficulty moving BLE due to c/o increased pain  Transfers Overall transfer level: Needs assistance Equipment used: Rolling walker (2 wheeled) Transfers: Sit to/from [facility] Sit to Stand: Mod assist Stand pivot transfers: Mod assist       General transfer comment: unsteady on feet, limited for standing due to BLE pain  Ambulation/Gait Ambulation/Gait assistance: Mod assist;Max assist Gait Distance (Feet): 4 Feet Assistive device: Rolling walker (2 wheeled) Gait Pattern/deviations: Decreased step length - right;Decreased step length - left;Decreased stride length Gait velocity: decreased   General Gait Details: limited to 4-5 slow labored unsteady steps due to BLE pain and generalized weakness  Stairs            Wheelchair Mobility    Modified Rankin (Stroke Patients Only)       Balance Overall balance assessment: Needs assistance Sitting-balance support: Feet supported;Bilateral upper extremity supported Sitting balance-Leahy Scale: Fair Sitting balance - Comments: seated at EOB   Standing balance support: During functional activity;Bilateral upper extremity supported Standing balance-Leahy Scale: Poor Standing balance comment: fair/poor using RW                             Pertinent Vitals/Pain Pain Assessment: Faces Faces Pain Scale: Hurts whole lot Pain Location: BLE especially heels due to pressure sores Pain Descriptors /  Indicators: Sore;Discomfort;Grimacing;Guarding;Tender Pain Intervention(s): Limited activity within patient's tolerance;Monitored during session;Repositioned    Home Living Family/patient expects to be discharged to:: Skilled nursing facility               Home Equipment:  Dan Humphreys - 2 wheels      Prior Function Level of Independence: Needs assistance   Gait / Transfers Assistance Needed: Supervised household ambulaor using RW  ADL's / Homemaking Assistance Needed: assisted by SNF staff        Hand Dominance        Extremity/Trunk Assessment   Upper Extremity Assessment Upper Extremity Assessment: Defer to OT evaluation    Lower Extremity Assessment Lower Extremity Assessment: Generalized weakness    Cervical / Trunk Assessment Cervical / Trunk Assessment: Normal  Communication   Communication: No difficulties  Cognition Arousal/Alertness: Awake/alert Behavior During Therapy: WFL for tasks assessed/performed;Anxious Overall Cognitive Status: Within Functional Limits for tasks assessed                                        General Comments      Exercises     Assessment/Plan    PT Assessment Patient needs continued PT services  PT Problem List Decreased strength;Decreased activity tolerance;Decreased balance;Decreased mobility       PT Treatment Interventions Gait training;Stair training;Functional mobility training;Therapeutic activities;Therapeutic exercise;Patient/family education    PT Goals (Current goals can be found in the Care Plan section)  Acute Rehab PT Goals Patient Stated Goal: return home PT Goal Formulation: With patient Time For Goal Achievement: 07/19/19 Potential to Achieve Goals: Good    Frequency Min 3X/week   Barriers to discharge        Co-evaluation               AM-PAC PT "6 Clicks" Mobility  Outcome Measure Help needed turning from your back to your side while in a flat bed without using bedrails?: A Lot Help needed moving from lying on your back to sitting on the side of a flat bed without using bedrails?: A Lot Help needed moving to and from a bed to a chair (including a wheelchair)?: A Lot Help needed standing up from a chair using your arms (e.g., wheelchair or  bedside chair)?: A Lot Help needed to walk in hospital room?: A Lot Help needed climbing 3-5 steps with a railing? : Total 6 Click Score: 11    End of Session   Activity Tolerance: Patient tolerated treatment well;Patient limited by fatigue Patient left: in chair;with call bell/phone within reach Nurse Communication: Mobility status PT Visit Diagnosis: Unsteadiness on feet (R26.81);Other abnormalities of gait and mobility (R26.89);Muscle weakness (generalized) (M62.81)    Time: 8341-9622 PT Time Calculation (min) (ACUTE ONLY): 31 min   Charges:   PT Evaluation $PT Eval Moderate Complexity: 1 Mod PT Treatments $Therapeutic Activity: 23-37 mins        9:41 AM, 07/05/19 Ocie Bob, MPT Physical Therapist with [facility] [phone] office [phone] mobile phone

## 2019-07-05 NOTE — Plan of Care (Signed)
  Problem: Acute Rehab PT Goals(only PT should resolve) Goal: Pt Will Go Supine/Side To Sit Outcome: Progressing Flowsheets (Taken 07/05/2019 0942) Pt will go Supine/Side to Sit: with minimal assist Goal: Patient Will Transfer Sit To/From Stand Outcome: Progressing Flowsheets (Taken 07/05/2019 [phone]) Patient will transfer sit to/from stand: with minimal assist Goal: Pt Will Transfer Bed To Chair/Chair To Bed Outcome: Progressing Flowsheets (Taken 07/05/2019 0942) Pt will Transfer Bed to Chair/Chair to Bed: with min assist Goal: Pt Will Ambulate Outcome: Progressing Flowsheets (Taken 07/05/2019 0942) Pt will Ambulate:  25 feet  with minimal assist  with rolling walker  with moderate assist   9:43 AM, 07/05/19 Ocie Bob, MPT Physical Therapist with [facility] [phone] office [phone] mobile phone

## 2019-07-05 NOTE — Consult Note (Signed)
WOC Nurse Consult Note: Reason for Consult: Bilateral heel Unstageable pressure injuries, R>L. Wound type:Pressure Pressure Injury POA: Yes Measurement: Left heel = 3cm round with depth unable to be determined due to the presence of black eschar Right Heel = 4cm round with depth unable to be determined due to the presence of black eschar Wound bed:see above.  There is a small amount of bleeding with the removal of the dry dressing on each wound at the periphery.  Drainage (amount, consistency, odor) See above.  No other drainage Periwound:erythematous (blanching), dry Dressing procedure/placement/frequency: I will provide Nursing with guidance for conservative topical care using a soap and water cleanse to decrease surface bacteria followed by application of a betadine swabstick to dry and reduce infection risk in these two areas. The betadine solution will be allowed to air dry and the heels will be topped with dry gauze dressings and secured.  Pressure redistribution both while in bed and in a chair will be with a Prevalon boot. It is not recommended to debride stable eschar.  Because the perfusion status of the limbs is not known, I recommend considering a consultation with vascular or general surgery to see if they wish to order and interpret ABIs or pursue further evaluation.  WOC nursing team will not follow, but will remain available to this patient, the nursing and medical teams.  Please re-consult if needed. Thanks, Ladona Mow, MSN, RN, GNP, [place]  Pager# [phone]

## 2019-07-05 NOTE — TOC Transition Note (Signed)
Transition of Care [facility]) - CM/SW Discharge Note   Patient Details  Name: Kimberly Velazquez MRN: [id number] Date of Birth: 24-Apr-1919  Transition of Care [facility]) CM/SW Contact:  Leitha Bleak, RN Phone Number: 07/05/2019, 12:41 PM   Clinical Narrative:   Patient discharging back to [facility] today. DC summary sent to Tammy in the hub. RN to call report after COVID results.  Tammy updated, patient will need a hospice referral for [facility] to assess. Family has been updated.    Final next level of care: Skilled Nursing Facility Barriers to Discharge: Barriers Resolved   Patient Goals and CMS Choice Patient states their goals for this hospitalization and ongoing recovery are:: to go back to SNF. CMS Medicare.gov Compare Post Acute Care list provided to:: Patient Represenative (must comment)    Discharge Placement     [facility]

## 2019-07-05 NOTE — Discharge Summary (Signed)
Physician Discharge Summary  Kimberly Velazquez:[id number] DOB: 1919/08/14 DOA: 07/04/2019  PCP: Celene Squibb, MD  Admit date: 07/04/2019  Discharge date: 07/05/2019  Admitted From:SNF  Disposition:  SNF  Recommendations for Outpatient Follow-up:  1. Follow up with PCP in 1-2 weeks 2. Please obtain BMP in one week to reassess renal function which has improved with creatinine 1.57 on day of discharge 3. Continue on Levaquin as prescribed for total 7-day course to treat presumed UTI and heel wound with cellulitis 4. Patient will be set up with hospice evaluation at [facility]: None  Equipment/Devices: None  Discharge Condition: Stable  CODE STATUS: DNR  Diet recommendation: Heart Healthy  Brief/Interim Summary: Per HPI: Kimberly Velazquez is a 86 y.o. female with medical history significant of dementia, hypothyroidism, anemia, failure to thrive with resident at the [facility] and sent into the ER for further evaluation due to worsening weakness.  History has been obtained from the ER record as the patient has dementia and is a poor historian.  Patient has been having failure to thrive with decreased p.o. intake and overall physical decline over the last several weeks.  She was independent at home until about 4 weeks ago and then placed in the [facility].  She has been having decreased appetite and has been offered Ensure at the nursing home but has had difficulty with keeping a good intake.  No nausea, vomiting reported.  No diarrhea, constipation.  No fever, chills, dysuria reported.  No chest pain, shortness of breath, palpitations, dizziness, lightheadedness.  Walks with a walker at baseline.  Has developed pressure ulcers on both heels.  3/26: Patient was admitted with failure to thrive in the setting of dementia and is noted to have AKI on CKD stage IIIb as well as concern for UTI and likely bilateral heel wounds with cellulitis.  She was started on Levaquin as well as IV  fluid with improvement in acute kidney injury.  She is alert and conversant and appears to be mostly at baseline.  She can be discharged back to SNF on oral antibiotics at this time and will need close follow-up of her BMP levels to ensure that renal function remains improved.  Unfortunately, it appears that she has been declining steadily over the last several weeks and is likely near end-of-life.  I have discussed this with her son on the phone and I have recommended hospice evaluation at her facility and he is in agreement.  I will continue Levaquin to finish the course of treatment, but I believe at this point in time she truly requires hospice/palliative evaluation for end-of-life care given her failure to thrive.  No other acute events noted throughout this brief admission.  She is stable for discharge.  Discharge Diagnoses:  Principal Problem:   Acute renal failure (ARF) (HCC) Active Problems:   Hypothyroidism   CKD (chronic kidney disease) stage 2, GFR 60-89 ml/min   Failure to thrive in adult   UTI (urinary tract infection)  Principal discharge diagnosis: Failure to thrive in the setting of chronic dementia with associated AKI on CKD stage IIIb as well as likely UTI/heel wound cellulitis.  Discharge Instructions  Discharge Instructions    Diet - low sodium heart healthy   Complete by: As directed    Increase activity slowly   Complete by: As directed      Allergies as of 07/05/2019      Reactions   Cephalexin Other (See Comments)  Pt. Doesn't remember.    Cephalexin Other (See Comments)   Pt. Doesn't remember.    Penicillins Other (See Comments)   Pt. Cant remember.       Medication List    STOP taking these medications   ibuprofen 400 MG tablet Commonly known as: ADVIL     TAKE these medications   acetaminophen 500 MG tablet Commonly known as: TYLENOL 500-1,000 mg every 8 (eight) hours as needed.   diclofenac Sodium 1 % Gel Commonly known as: VOLTAREN Apply 2 g  topically every 6 (six) hours as needed. Apply topically to bilateral knees as needed for arthritic pain.   docusate sodium 100 MG capsule Commonly known as: COLACE Take 100 mg by mouth 2 (two) times daily.   feeding supplement (ENSURE ENLIVE) Liqd Take 237 mLs by mouth daily.   levofloxacin 500 MG tablet Commonly known as: Levaquin Take 1 tablet (500 mg total) by mouth every other day for 6 days.   levothyroxine 100 MCG tablet Commonly known as: SYNTHROID Take 100 mcg by mouth daily before breakfast.   Myrbetriq 50 MG Tb24 tablet Generic drug: mirabegron ER Take 50 mg by mouth at bedtime.   NON FORMULARY Diet: _____ Regular,  ___x___ NAS,  _______Consistent Carbohydrate,  _______NPO  _____Other   traMADol 50 MG tablet Commonly known as: ULTRAM Take 1 tablet (50 mg total) by mouth every 12 (twelve) hours as needed for moderate pain or severe pain.      Follow-up Information    Benita Stabile, MD Follow up in 1 week(s).   Specialty: Internal Medicine Contact information: [address] [place] [address] [phone]          Allergies  Allergen Reactions  . Cephalexin Other (See Comments)    Pt. Doesn't remember.   . Cephalexin Other (See Comments)    Pt. Doesn't remember.   . Penicillins Other (See Comments)    Pt. Cant remember.     Consultations:  None   Procedures/Studies: DG Abdomen Acute W/Chest  Result Date: 07/04/2019 CLINICAL DATA:  Weakness, pain EXAM: DG ABDOMEN ACUTE W/ 1V CHEST COMPARISON:  None. FINDINGS: Heart is mildly enlarged. Tortuous aorta with calcifications. Calcified granuloma in the right upper lobe. No confluent opacities or effusions. Nonobstructive bowel gas pattern. No organomegaly or free air. Prior left hip replacement. Severe lumbar scoliosis. IMPRESSION: No evidence of bowel obstruction or free air. Cardiomegaly. No acute cardiopulmonary disease. Electronically Signed   By: Charlett Nose M.D.   On: 07/04/2019 23:40      Discharge Exam: Vitals:   07/05/19 0535 07/05/19 1000  BP: 138/62 133/61  Pulse: 84 82  Resp: 14 16  Temp: 98.8 F (37.1 C) 98.7 F (37.1 C)  SpO2: 95% 96%   Vitals:   07/05/19 0041 07/05/19 0221 07/05/19 0535 07/05/19 1000  BP: (!) 147/89 (!) 130/55 138/62 133/61  Pulse: 89 83 84 82  Resp: 16 16 14 16   Temp: 97.8 F (36.6 C) 98.6 F (37 C) 98.8 F (37.1 C) 98.7 F (37.1 C)  TempSrc: Oral Oral Oral Oral  SpO2: 93% 96% 95% 96%  Weight: 60.4 kg     Height:        General: Pt is alert, awake, not in acute distress Cardiovascular: RRR, S1/S2 +, no rubs, no gallops Respiratory: CTA bilaterally, no wheezing, no rhonchi Abdominal: Soft, NT, ND, bowel sounds + Extremities: no edema, no cyanosis    The results of significant diagnostics from this hospitalization (including  imaging, microbiology, ancillary and laboratory) are listed below for reference.     Microbiology: No results found for this or any previous visit (from the past 240 hour(s)).   Labs: BNP (last 3 results) No results for input(s): BNP in the last 8760 hours. Basic Metabolic Panel: Recent Labs  Lab 07/04/19 1123 07/04/19 2050 07/05/19 0540  NA 141  --  144  K 5.3*  --  4.3  CL 102  --  107  CO2 25  --  25  GLUCOSE 190*  --  90  BUN 97*  --  75*  CREATININE 2.41*  --  1.57*  CALCIUM 9.0  --  8.6*  MG  --  2.8*  --    Liver Function Tests: Recent Labs  Lab 07/04/19 1123  AST 42*  ALT 65*  ALKPHOS 158*  BILITOT 0.6  PROT 7.5  ALBUMIN 3.2*   Recent Labs  Lab 07/04/19 2050  LIPASE 25   No results for input(s): AMMONIA in the last 168 hours. CBC: Recent Labs  Lab 07/04/19 1155 07/05/19 0540  WBC 10.4 7.8  NEUTROABS 8.3*  --   HGB 11.5* 10.6*  HCT 36.6 34.3*  MCV 103.1* 104.6*  PLT 214 190   Cardiac Enzymes: No results for input(s): CKTOTAL, CKMB, CKMBINDEX, TROPONINI in the last 168 hours. BNP: Invalid input(s): POCBNP CBG: No results for input(s): GLUCAP in the  last 168 hours. D-Dimer No results for input(s): DDIMER in the last 72 hours. Hgb A1c No results for input(s): HGBA1C in the last 72 hours. Lipid Profile No results for input(s): CHOL, HDL, LDLCALC, TRIG, CHOLHDL, LDLDIRECT in the last 72 hours. Thyroid function studies Recent Labs    07/04/19 2050  TSH 1.382   Anemia work up Recent Labs    07/04/19 2050 07/05/19 0540  VITAMINB12 1,014*  --   FOLATE  --  53.0  FERRITIN 153  --   TIBC 260  --   IRON 23*  --   RETICCTPCT 1.3  --    Urinalysis    Component Value Date/Time   COLORURINE YELLOW 07/04/2019 2248   APPEARANCEUR HAZY (A) 07/04/2019 2248   LABSPEC 1.015 07/04/2019 2248   PHURINE 5.0 07/04/2019 2248   GLUCOSEU NEGATIVE 07/04/2019 2248   HGBUR SMALL (A) 07/04/2019 2248   BILIRUBINUR NEGATIVE 07/04/2019 2248   KETONESUR 5 (A) 07/04/2019 2248   PROTEINUR 30 (A) 07/04/2019 2248   UROBILINOGEN 0.2 01/18/2010 1006   NITRITE POSITIVE (A) 07/04/2019 2248   LEUKOCYTESUR LARGE (A) 07/04/2019 2248   Sepsis Labs Invalid input(s): PROCALCITONIN,  WBC,  LACTICIDVEN Microbiology No results found for this or any previous visit (from the past 240 hour(s)).   Time coordinating discharge: 35 minutes  SIGNED:   Erick Blinks, DO [facility] 07/05/2019, 11:54 AM  If 7PM-7AM, please contact night-coverage [email or web address]

## 2019-07-05 NOTE — Progress Notes (Signed)
Performed wound care and applied Prevalon boots.

## 2019-07-05 NOTE — Plan of Care (Signed)
  Problem: Education: Goal: Knowledge of General Education information will improve Description Including pain rating scale, medication(s)/side effects and non-pharmacologic comfort measures Outcome: Progressing   Problem: Health Behavior/Discharge Planning: Goal: Ability to manage health-related needs will improve Outcome: Progressing   

## 2019-07-07 LAB — URINE CULTURE: Culture: >=100000 — AB

## 2019-07-08 ENCOUNTER — Encounter: Payer: Self-pay | Admitting: Adult Health

## 2019-07-08 ENCOUNTER — Non-Acute Institutional Stay (SKILLED_NURSING_FACILITY): Payer: Medicare HMO | Admitting: Adult Health

## 2019-07-08 DIAGNOSIS — E876 Hypokalemia: Secondary | ICD-10-CM

## 2019-07-08 DIAGNOSIS — L89620 Pressure ulcer of left heel, unstageable: Secondary | ICD-10-CM | POA: Diagnosis not present

## 2019-07-08 DIAGNOSIS — L89610 Pressure ulcer of right heel, unstageable: Secondary | ICD-10-CM

## 2019-07-08 DIAGNOSIS — R627 Adult failure to thrive: Secondary | ICD-10-CM

## 2019-07-08 DIAGNOSIS — E039 Hypothyroidism, unspecified: Secondary | ICD-10-CM

## 2019-07-08 DIAGNOSIS — R634 Abnormal weight loss: Secondary | ICD-10-CM

## 2019-07-08 DIAGNOSIS — R32 Unspecified urinary incontinence: Secondary | ICD-10-CM | POA: Diagnosis not present

## 2019-07-08 DIAGNOSIS — L89613 Pressure ulcer of right heel, stage 3: Secondary | ICD-10-CM | POA: Insufficient documentation

## 2019-07-08 DIAGNOSIS — K5909 Other constipation: Secondary | ICD-10-CM | POA: Diagnosis not present

## 2019-07-08 DIAGNOSIS — N1832 Chronic kidney disease, stage 3b: Secondary | ICD-10-CM

## 2019-07-08 NOTE — Progress Notes (Signed)
Location:    [facility] Room Number: 145/D Place of Service:  SNF (31)   CODE STATUS: Full Code  Allergies  Allergen Reactions  . Cephalexin Other (See Comments)    Pt. Doesn't remember.   . Cephalexin Other (See Comments)    Pt. Doesn't remember.   . Penicillins Other (See Comments)    Pt. Cant remember.     Chief Complaint  Patient presents with  . Hospitalization Follow-up    Hospitalization Follow Up    HPI:  She is a 86 year old woman who was hospitalized over night from 3-35-21 through 07-05-19. She has acute on chronic renal failure; uti. More than likely this does represent log term placement for her. She tells me that she is feeling better; her heels are tender to touch. She denies any hunger or thirst. She will continue to be followed for her chronic illnesses including: hypothyroidism; failure to thrive UI.   Past Medical History:  Diagnosis Date  . Thyroid disease     Past Surgical History:  Procedure Laterality Date  . ABDOMINAL HYSTERECTOMY    . TONSILLECTOMY    . TOTAL HIP ARTHROPLASTY     left    Social History   Socioeconomic History  . Marital status: Widowed    Spouse name: Not on file  . Number of children: Not on file  . Years of education: Not on file  . Highest education level: Not on file  Occupational History  . Not on file  Tobacco Use  . Smoking status: Never Smoker  . Smokeless tobacco: Never Used  Substance and Sexual Activity  . Alcohol use: No  . Drug use: No  . Sexual activity: Not Currently  Other Topics Concern  . Not on file  Social History Narrative  . Not on file   Social Determinants of Health   Financial Resource Strain:   . Difficulty of Paying Living Expenses:   Food Insecurity:   . Worried About Charity fundraiser in the Last Year:   . Arboriculturist in the Last Year:   Transportation Needs:   . Film/video editor (Medical):   Marland Kitchen Lack of Transportation (Non-Medical):    Physical Activity:   . Days of Exercise per Week:   . Minutes of Exercise per Session:   Stress:   . Feeling of Stress :   Social Connections:   . Frequency of Communication with Friends and Family:   . Frequency of Social Gatherings with Friends and Family:   . Attends Religious Services:   . Active Member of Clubs or Organizations:   . Attends Archivist Meetings:   Marland Kitchen Marital Status:   Intimate Partner Violence:   . Fear of Current or Ex-Partner:   . Emotionally Abused:   Marland Kitchen Physically Abused:   . Sexually Abused:    Family History  Problem Relation Age of Onset  . Heart attack Mother   . Heart attack Father       VITAL SIGNS BP (!) 161/60   Pulse 90   Temp 97.8 F (36.6 C) (Oral)   Resp 18   Ht 5' 1"  (1.549 m)   Wt 135 lb 9.6 oz (61.5 kg)   SpO2 90%   BMI 25.62 kg/m   Outpatient Encounter Medications as of 07/08/2019  Medication Sig  . acetaminophen (TYLENOL) 650 MG CR tablet Take 650 mg by mouth every 6 (six) hours.  Roseanne Kaufman Peru-Castor Oil (VENELEX)  OINT Special Instructions: Apply to sacrum and bilateral buttocks qshift for prevention. Every Shift Day, Evening, Night  . collagenase (SANTYL) ointment Special Instructions: Apply to wounds per tx order.  . diclofenac Sodium (VOLTAREN) 1 % GEL Apply 2 g topically every 6 (six) hours as needed. Apply topically to bilateral knees as needed for arthritic pain.  Marland Kitchen docusate sodium (COLACE) 100 MG capsule Take 100 mg by mouth 2 (two) times daily.  . feeding supplement, ENSURE ENLIVE, (ENSURE ENLIVE) LIQD Take 237 mLs by mouth daily.  Marland Kitchen levofloxacin (LEVAQUIN) 500 MG tablet Take 1 tablet (500 mg total) by mouth every other day for 6 days.  Marland Kitchen levothyroxine (SYNTHROID) 100 MCG tablet Take 100 mcg by mouth daily before breakfast.  . mirabegron ER (MYRBETRIQ) 50 MG TB24 tablet Take 50 mg by mouth at bedtime.  . NON FORMULARY Diet: _____ Regular,  ___x___ NAS,  _______Consistent Carbohydrate,  _______NPO   _____Other  . [DISCONTINUED] acetaminophen (TYLENOL) 500 MG tablet 500-1,000 mg every 8 (eight) hours as needed.   . [DISCONTINUED] traMADol (ULTRAM) 50 MG tablet Take 1 tablet (50 mg total) by mouth every 12 (twelve) hours as needed for moderate pain or severe pain.   No facility-administered encounter medications on file as of 07/08/2019.     SIGNIFICANT DIAGNOSTIC EXAMS  TODAY  07-04-19: acute abdomen x-ray: No evidence of bowel obstruction or free air. Cardiomegaly  No acute cardiopulmonary disease.   LABS REVIEWED PREVIOUS  06-10-19: wbc 5.9; hgb 11.6; hct 37.2; mcv 102.8 plt 237 glucose 91; bun 50; creat 1.05 ;k+ 4.4; na++ 141; ca 9.1 liver normal albumin 3.5 07-04-19: wbc 10.4; hgb 11.5; hct 36.6; mcv 103.1 plt 214; glucose 190; bun 97; creat 2.41; k+ 5.3; na++ 141; ca 9.0 ast 42; alt 65; alk phos 158; albumin 3.2    TODAY  07-04-19: vit B 12: 1014; iron 23;tibc 260 tsh 1.382 free T4: 1.66 urine culture: e-coli   Review of Systems  Constitutional: Negative for malaise/fatigue.  Respiratory: Negative for cough and shortness of breath.   Cardiovascular: Negative for chest pain, palpitations and leg swelling.  Gastrointestinal: Negative for abdominal pain, constipation and heartburn.  Musculoskeletal: Positive for joint pain. Negative for back pain and myalgias.       Bilateral heel pain   Skin: Negative.   Neurological: Negative for dizziness.  Psychiatric/Behavioral: The patient is not nervous/anxious.     Physical Exam Constitutional:      General: She is not in acute distress.    Appearance: She is well-developed. She is not diaphoretic.  Neck:     Thyroid: No thyromegaly.  Cardiovascular:     Rate and Rhythm: Normal rate and regular rhythm.     Heart sounds: Normal heart sounds.     Comments: Unable to palpate pedal or post tib pulses  Pulmonary:     Effort: Pulmonary effort is normal. No respiratory distress.     Breath sounds: Normal breath sounds.  Abdominal:      General: Bowel sounds are normal. There is no distension.     Palpations: Abdomen is soft.     Tenderness: There is no abdominal tenderness.  Musculoskeletal:     Cervical back: Neck supple.     Right lower leg: No edema.     Left lower leg: No edema.     Comments: Is able to move all extremities   Lymphadenopathy:     Cervical: No cervical adenopathy.  Skin:    General: Skin is warm and dry.  Comments: Left heel: 3.5 x 3.8 cm Right heel: 7.0 x 5.0 cm   Neurological:     Mental Status: She is alert. Mental status is at baseline.  Psychiatric:        Mood and Affect: Mood normal.       ASSESSMENT/ PLAN:  TODAY  1. E-coli UTI: is stable will complete levaquin 500 mg very other day for 6 days and will monitor her status.   2. Failure to thrive in adult/weight loss her current weight is 133 pounds she was unable to tolerate remeron will continue supplements as directed  3. Urinary incontinence in female is table will continue mybetriq 50 mg daily   4. Acquired hypothyroidism: is stable tsh 1.382 will continue synthroid 100 mcg daily   5. Chronic constipation: is stable will continue colace twice daily   6. Hypokalemia: is stable k+ is 4.3 will continue to moniotorher status.    7. CKD stage 3b: is without change bun 75 creat 1.57 will monitor   8. Bilateral unstaged heel ulcerations: is without change: will continue treatments as indicated and will monitor her status    MD is aware of resident's narcotic use and is in agreement with current plan of care. We will attempt to wean resident as appropriate.  Ok Edwards NP [facility]  Contact [phone] Monday through Friday 8am- 5pm  After hours call [phone]

## 2019-07-09 ENCOUNTER — Non-Acute Institutional Stay (SKILLED_NURSING_FACILITY): Payer: Medicare HMO | Admitting: Adult Health

## 2019-07-09 ENCOUNTER — Encounter: Payer: Self-pay | Admitting: Adult Health

## 2019-07-09 ENCOUNTER — Other Ambulatory Visit: Payer: Self-pay | Admitting: Adult Health

## 2019-07-09 DIAGNOSIS — L89610 Pressure ulcer of right heel, unstageable: Secondary | ICD-10-CM

## 2019-07-09 DIAGNOSIS — L89620 Pressure ulcer of left heel, unstageable: Secondary | ICD-10-CM

## 2019-07-09 DIAGNOSIS — N183 Chronic kidney disease, stage 3 unspecified: Secondary | ICD-10-CM | POA: Insufficient documentation

## 2019-07-09 MED ORDER — TRAMADOL HCL 50 MG PO TABS: 50.0000 mg | ORAL_TABLET | Freq: Three times a day (TID) | ORAL | 0 refills | Status: DC | PRN

## 2019-07-09 NOTE — Progress Notes (Signed)
Location:    [place] Room Number: 145/D Place of Service:  SNF (31)   CODE STATUS: Full Code  Allergies  Allergen Reactions  . Cephalexin Other (See Comments)    Pt. Doesn't remember.   . Cephalexin Other (See Comments)    Pt. Doesn't remember.   . Penicillins Other (See Comments)    Pt. Cant remember.     Chief Complaint  Patient presents with  . Acute Visit    Pain Management    HPI:  she has bilateral heel ulcerations which are causing her pain. She is presently taking tylenol on a routine basis. Her ultram had been stopped. She is not tolerating this wean. She is unable to rest due to her pain.   Past Medical History:  Diagnosis Date  . Thyroid disease     Past Surgical History:  Procedure Laterality Date  . ABDOMINAL HYSTERECTOMY    . TONSILLECTOMY    . TOTAL HIP ARTHROPLASTY     left    Social History   Socioeconomic History  . Marital status: Widowed    Spouse name: Not on file  . Number of children: Not on file  . Years of education: Not on file  . Highest education level: Not on file  Occupational History  . Not on file  Tobacco Use  . Smoking status: Never Smoker  . Smokeless tobacco: Never Used  Substance and Sexual Activity  . Alcohol use: No  . Drug use: No  . Sexual activity: Not Currently  Other Topics Concern  . Not on file  Social History Narrative  . Not on file   Social Determinants of Health   Financial Resource Strain:   . Difficulty of Paying Living Expenses:   Food Insecurity:   . Worried About Charity fundraiser in the Last Year:   . Arboriculturist in the Last Year:   Transportation Needs:   . Film/video editor (Medical):   Marland Kitchen Lack of Transportation (Non-Medical):   Physical Activity:   . Days of Exercise per Week:   . Minutes of Exercise per Session:   Stress:   . Feeling of Stress :   Social Connections:   . Frequency of Communication with Friends and Family:   . Frequency of  Social Gatherings with Friends and Family:   . Attends Religious Services:   . Active Member of Clubs or Organizations:   . Attends Archivist Meetings:   Marland Kitchen Marital Status:   Intimate Partner Violence:   . Fear of Current or Ex-Partner:   . Emotionally Abused:   Marland Kitchen Physically Abused:   . Sexually Abused:    Family History  Problem Relation Age of Onset  . Heart attack Mother   . Heart attack Father       VITAL SIGNS BP (!) 161/60   Pulse 90   Temp 97.8 F (36.6 C) (Oral)   Resp 18   Ht 5' 1"  (1.549 m)   Wt 133 lb 12.8 oz (60.7 kg)   SpO2 90%   BMI 25.28 kg/m   Outpatient Encounter Medications as of 07/09/2019  Medication Sig  . acetaminophen (TYLENOL) 650 MG CR tablet Take 650 mg by mouth every 6 (six) hours.  Roseanne Kaufman Peru-Castor Oil [facility]) OINT Special Instructions: Apply to sacrum and bilateral buttocks qshift for prevention. Every Shift Day, Evening, Night  . collagenase (SANTYL) ointment Special Instructions: Apply to wounds per tx order.  . diclofenac  Sodium (VOLTAREN) 1 % GEL Apply 2 g topically every 6 (six) hours as needed. Apply topically to bilateral knees as needed for arthritic pain.  Marland Kitchen docusate sodium (COLACE) 100 MG capsule Take 100 mg by mouth 2 (two) times daily.  . feeding supplement, ENSURE ENLIVE, (ENSURE ENLIVE) LIQD Take 237 mLs by mouth daily.  Marland Kitchen levofloxacin (LEVAQUIN) 500 MG tablet Take 1 tablet (500 mg total) by mouth every other day for 6 days.  Marland Kitchen levothyroxine (SYNTHROID) 100 MCG tablet Take 100 mcg by mouth daily before breakfast.  . mirabegron ER (MYRBETRIQ) 50 MG TB24 tablet Take 50 mg by mouth at bedtime.  . NON FORMULARY Diet: _____ Regular,  ___x___ NAS,  _______Consistent Carbohydrate,  _______NPO  _____Other  . traMADol (ULTRAM) 50 MG tablet Take 50 mg by mouth every 6 (six) hours as needed.  . [DISCONTINUED] traMADol (ULTRAM) 50 MG tablet Take 1 tablet (50 mg total) by mouth every 8 (eight) hours as needed for up to 8  days. (Patient taking differently: Take 50 mg by mouth every 6 (six) hours as needed. )   No facility-administered encounter medications on file as of 07/09/2019.     SIGNIFICANT DIAGNOSTIC EXAMS  PREVIOUS  07-04-19: acute abdomen x-ray: No evidence of bowel obstruction or free air. Cardiomegaly  No acute cardiopulmonary disease.  NO NEW EXAMS.    LABS REVIEWED PREVIOUS  06-10-19: wbc 5.9; hgb 11.6; hct 37.2; mcv 102.8 plt 237 glucose 91; bun 50; creat 1.05 ;k+ 4.4; na++ 141; ca 9.1 liver normal albumin 3.5 07-04-19: wbc 10.4; hgb 11.5; hct 36.6; mcv 103.1 plt 214; glucose 190; bun 97; creat 2.41; k+ 5.3; na++ 141; ca 9.0 ast 42; alt 65; alk phos 158; albumin 3.2   07-04-19: vit B 12: 1014; iron 23;tibc 260 tsh 1.382 free T4: 1.66 urine culture: e-coli   NO NEW LABS.   Review of Systems  Constitutional: Negative for malaise/fatigue.  Respiratory: Negative for cough and shortness of breath.   Cardiovascular: Negative for chest pain, palpitations and leg swelling.  Gastrointestinal: Negative for abdominal pain, constipation and heartburn.  Musculoskeletal: Positive for joint pain. Negative for back pain and myalgias.       Bilateral heel pain   Skin: Negative.   Neurological: Negative for dizziness.  Psychiatric/Behavioral: The patient is not nervous/anxious.     Physical Exam Constitutional:      General: She is not in acute distress.    Appearance: She is well-developed. She is not diaphoretic.  Neck:     Thyroid: No thyromegaly.  Cardiovascular:     Rate and Rhythm: Normal rate and regular rhythm.     Heart sounds: Normal heart sounds.     Comments: Unable to palpate pedal or post tib pulses  Pulmonary:     Effort: Pulmonary effort is normal. No respiratory distress.     Breath sounds: Normal breath sounds.  Abdominal:     General: Bowel sounds are normal. There is no distension.     Palpations: Abdomen is soft.     Tenderness: There is no abdominal tenderness.   Musculoskeletal:     Cervical back: Neck supple.     Right lower leg: No edema.     Left lower leg: No edema.     Comments: Is able to move all extremities   Lymphadenopathy:     Cervical: No cervical adenopathy.  Skin:    General: Skin is warm and dry.     Comments: Left heel: 3.5 x 3.8 cm  Right heel: 7.0 x 5.0 cm    Neurological:     Mental Status: She is alert. Mental status is at baseline.  Psychiatric:        Mood and Affect: Mood normal.     ASSESSMENT/ PLAN:  TODAY  1. Pressure ulcer of both heels unstagable: will restart ultram 50 mg every 6 hours as needed through 07-17-19.     MD is aware of resident's narcotic use and is in agreement with current plan of care. We will attempt to wean resident as appropriate.  Ok Edwards NP [facility]  Contact [phone] Monday through Friday 8am- 5pm  After hours call [phone]

## 2019-07-10 ENCOUNTER — Encounter: Payer: Self-pay | Admitting: Internal Medicine

## 2019-07-10 ENCOUNTER — Non-Acute Institutional Stay (SKILLED_NURSING_FACILITY): Payer: Medicare HMO | Admitting: Internal Medicine

## 2019-07-10 DIAGNOSIS — N3281 Overactive bladder: Secondary | ICD-10-CM | POA: Diagnosis not present

## 2019-07-10 DIAGNOSIS — L89610 Pressure ulcer of right heel, unstageable: Secondary | ICD-10-CM

## 2019-07-10 DIAGNOSIS — N1832 Chronic kidney disease, stage 3b: Secondary | ICD-10-CM | POA: Diagnosis not present

## 2019-07-10 DIAGNOSIS — L89620 Pressure ulcer of left heel, unstageable: Secondary | ICD-10-CM | POA: Diagnosis not present

## 2019-07-10 DIAGNOSIS — F0390 Unspecified dementia without behavioral disturbance: Secondary | ICD-10-CM

## 2019-07-10 DIAGNOSIS — N179 Acute kidney failure, unspecified: Secondary | ICD-10-CM

## 2019-07-10 DIAGNOSIS — R627 Adult failure to thrive: Secondary | ICD-10-CM | POA: Diagnosis not present

## 2019-07-10 DIAGNOSIS — R69 Illness, unspecified: Secondary | ICD-10-CM | POA: Diagnosis not present

## 2019-07-10 DIAGNOSIS — F03C0 Unspecified dementia, severe, without behavioral disturbance, psychotic disturbance, mood disturbance, and anxiety: Secondary | ICD-10-CM

## 2019-07-10 DIAGNOSIS — M170 Bilateral primary osteoarthritis of knee: Secondary | ICD-10-CM | POA: Diagnosis not present

## 2019-07-10 DIAGNOSIS — N3000 Acute cystitis without hematuria: Secondary | ICD-10-CM

## 2019-07-10 DIAGNOSIS — M25569 Pain in unspecified knee: Secondary | ICD-10-CM | POA: Diagnosis not present

## 2019-07-10 DIAGNOSIS — E039 Hypothyroidism, unspecified: Secondary | ICD-10-CM

## 2019-07-10 DIAGNOSIS — M4100 Infantile idiopathic scoliosis, site unspecified: Secondary | ICD-10-CM | POA: Diagnosis not present

## 2019-07-10 NOTE — Progress Notes (Signed)
: Provider:  Margit Hanks., MD Location:  [facility] Room Number: 145-D Place of Service:  SNF ([phone])  PCP: Benita Stabile, MD Patient Care Team: Benita Stabile, MD as PCP - General (Internal Medicine)  Extended Emergency Contact Information Primary Emergency Contact: Koenigsberg,[name] Address: [address]          [place], [place] [address] [place] Home Phone: [phone] Mobile Phone: [phone] Relation: Son Secondary Emergency Contact: Bessinger,[name] Address: [address]          [place], [place] [address] [place] Home Phone: [phone] Mobile Phone: [phone] Relation: Other     Allergies: Cephalexin, Cephalexin, and Penicillins  Chief Complaint  Patient presents with  . New Admit To SNF    New admission to [facility]    HPI: Patient is a 86 y.o. female dementia, hypothyroidism, anemia, facial swelling who was independent at home at about 4 weeks prior and she is placed in center.  She had been having decreased appetite and has been offered Ensure and other nutrients at the nursing home but has been difficulty keeping up with intake.  She has no complaints at baseline she walks with a walker.  Patient has developed pressure ulcers on both heels.  Edges admitted to anything hospital from 3/25-26 for failure to thrive and generalized weakness along with AKI on CKD 3B.  Patient was started on Levaquin as well as IV fluids with improvement in the acute kidney injury.  She became alert and conversant.  Mostly her baseline.  She is admitted back to SNF on oral antibiotics.  Hospice was recommended.  While at skilled nursing facility patient will be followed for hypothyroidism treated with Synthroid, overactive bladder treated with Myrbetriq and arthritis pain to knees.  Past Medical History:  Diagnosis Date  . Thyroid disease     Past Surgical History:  Procedure Laterality Date  . ABDOMINAL HYSTERECTOMY    .  TONSILLECTOMY    . TOTAL HIP ARTHROPLASTY     left    Allergies as of 07/10/2019      Reactions   Cephalexin Other (See Comments)   Pt. Doesn't remember.    Cephalexin Other (See Comments)   Pt. Doesn't remember.    Penicillins Other (See Comments)   Pt. Cant remember.       Medication List    Notice   This visit is during an admission. Changes to the med list made in this visit will be reflected in the After Visit Summary of the admission.    Current Outpatient Medications on File Prior to Visit  Medication Sig Dispense Refill  . acetaminophen (TYLENOL) 650 MG CR tablet Take 650 mg by mouth every 6 (six) hours.    Lucilla Lame [place]-Castor Oil [facility]) OINT Special Instructions: Apply to sacrum and bilateral buttocks qshift for prevention. Every Shift Day, Evening, Night    . collagenase (SANTYL) ointment Apply 1 application topically daily. Special Instructions: Apply to wounds per tx order.     . diclofenac Sodium (VOLTAREN) 1 % GEL Apply 2 g topically every 6 (six) hours as needed. Apply topically to bilateral knees as needed for arthritic pain.    Marland Kitchen docusate sodium (COLACE) 100 MG capsule Take 100 mg by mouth 2 (two) times daily.    . feeding supplement, ENSURE ENLIVE, (ENSURE ENLIVE) LIQD Take 237 mLs by mouth daily.    Marland Kitchen levothyroxine (SYNTHROID) 100 MCG tablet Take 100 mcg by mouth  daily before breakfast.    . mirabegron ER (MYRBETRIQ) 50 MG TB24 tablet Take 50 mg by mouth at bedtime.    . NON FORMULARY Diet: _____ Regular,  ___x___ NAS,  _______Consistent Carbohydrate,  _______NPO  _____Other    . traMADol (ULTRAM) 50 MG tablet Take 50 mg by mouth every 6 (six) hours as needed.     No current facility-administered medications on file prior to visit.     No orders of the defined types were placed in this encounter.   Immunization History  Administered Date(s) Administered  . Influenza-Unspecified 02/09/2018    Social History   Tobacco Use  . Smoking status:  Never Smoker  . Smokeless tobacco: Never Used  Substance Use Topics  . Alcohol use: No    Family history is   Family History  Problem Relation Age of Onset  . Heart attack Mother   . Heart attack Father       Review of Systems -unable to obtain secondary to dementia  Vitals:   07/10/19 1456  BP: 109/63  Pulse: 62  Resp: 20  Temp: 98.1 F (36.7 C)  SpO2: 90%    SpO2 Readings from Last 1 Encounters:  07/10/19 90%   Body mass index is 25.28 kg/m.     Physical Exam  GENERAL APPEARANCE: Alert, conversant,  No acute distress.  SKIN: Dressed heel ulcers; ambulates HEAD: Normocephalic, atraumatic  EYES: Conjunctiva/lids clear. Pupils round, reactive. EOMs intact.  EARS: External exam WNL, canals clear. Hearing grossly normal.  NOSE: No deformity or discharge.  MOUTH/THROAT: Lips w/o lesions  RESPIRATORY: Breathing is even, unlabored. Lung sounds are clear   CARDIOVASCULAR: Heart RRR no murmurs, rubs or gallops. No peripheral edema.   GASTROINTESTINAL: Abdomen is soft, non-tender, not distended w/ normal bowel sounds. GENITOURINARY: Bladder non tender, not distended  MUSCULOSKELETAL: No abnormal joints or musculature NEUROLOGIC:  Cranial nerves 2-12 grossly intact. Moves all extremities  PSYCHIATRIC: Mood and affect with dementia, no behavioral issues  Patient Active Problem List   Diagnosis Date Noted  . CKD (chronic kidney disease) stage 3, GFR 30-59 ml/min 07/09/2019  . Pressure ulcer of both heels, unstageable ([place]) 07/08/2019  . Weight loss 07/04/2019  . UTI (urinary tract infection) 07/04/2019  . Acute renal failure (ARF) ([place]) 07/04/2019  . Urinary incontinence in female 06/11/2019  . Chronic constipation 06/11/2019  . Hypokalemia 06/11/2019  . Failure to thrive in adult 06/11/2019  . Hip pain, chronic 12/11/2014  . Hypothyroidism 11/08/2014  . Low back pain 11/08/2014  . Fall 11/08/2014      Labs reviewed: Basic Metabolic Panel:    Component  Value Date/Time   NA 144 07/05/2019 0540   K 4.3 07/05/2019 0540   CL 107 07/05/2019 0540   CO2 25 07/05/2019 0540   GLUCOSE 90 07/05/2019 0540   BUN 75 (H) 07/05/2019 0540   CREATININE 1.57 (H) 07/05/2019 0540   CALCIUM 8.6 (L) 07/05/2019 0540   PROT 7.5 07/04/2019 1123   ALBUMIN 3.2 (L) 07/04/2019 1123   AST 42 (H) 07/04/2019 1123   ALT 65 (H) 07/04/2019 1123   ALKPHOS 158 (H) 07/04/2019 1123   BILITOT 0.6 07/04/2019 1123   GFRNONAA 27 (L) 07/05/2019 0540   GFRAA 31 (L) 07/05/2019 0540    Recent Labs    06/10/19 0313 07/04/19 1123 07/04/19 2050 07/05/19 0540  NA 141 141  --  144  K 4.4 5.3*  --  4.3  CL 103 102  --  107  CO2  28 25  --  25  GLUCOSE 91 190*  --  90  BUN 50* 97*  --  75*  CREATININE 1.05* 2.41*  --  1.57*  CALCIUM 9.1 9.0  --  8.6*  MG  --   --  2.8*  --    Liver Function Tests: Recent Labs    06/10/19 0313 07/04/19 1123  AST 19 42*  ALT 13 65*  ALKPHOS 72 158*  BILITOT 0.8 0.6  PROT 7.0 7.5  ALBUMIN 3.5 3.2*   Recent Labs    07/04/19 2050  LIPASE 25   No results for input(s): AMMONIA in the last 8760 hours. CBC: Recent Labs    06/10/19 0313 07/04/19 1155 07/05/19 0540  WBC 5.9 10.4 7.8  NEUTROABS  --  8.3*  --   HGB 11.6* 11.5* 10.6*  HCT 37.2 36.6 34.3*  MCV 102.8* 103.1* 104.6*  PLT 237 214 190   Lipid No results for input(s): CHOL, HDL, LDLCALC, TRIG in the last 8760 hours.  Cardiac Enzymes: No results for input(s): CKTOTAL, CKMB, CKMBINDEX, TROPONINI in the last 8760 hours. BNP: No results for input(s): BNP in the last 8760 hours. No results found for: MICROALBUR No results found for: HGBA1C Lab Results  Component Value Date   TSH 1.382 07/04/2019   Lab Results  Component Value Date   VITAMINB12 1,014 (H) 07/04/2019   Lab Results  Component Value Date   FOLATE 53.0 07/05/2019   Lab Results  Component Value Date   IRON 23 (L) 07/04/2019   TIBC 260 07/04/2019   FERRITIN 153 07/04/2019    Imaging and  Procedures obtained prior to SNF admission: No results found.   Not all labs, radiology exams or other studies done during hospitalization come through on my EPIC note; however they are reviewed by me.    Assessment and Plan  Failure to thrive/secondary to advanced dementia-hospice recommended SNF-admitted for hospice possibility and for supportive care  UTI versus cellulitis bilateral heels-patient started on Levaquin p.o. SNF-continue Levaquin 500 mg every other day for 6 days  AKI on CKD 3B-treated with IV fluids; patient improved with alertness and speaking SNF-follow-up BMP  Hypothyroidism SNF-not stated as uncontrolled; continue Synthroid 100 mcg daily  Overactive bladder SNF-continue Myrbetriq 50 mg daily  Osteoarthritis of knees SNF-continue Voltaren gel to bilateral knees as needed   Time spent greater than 35 minutes;> 50% of time with patient was spent reviewing records, labs, tests and studies, counseling and developing plan of care  Margit Hanks, MD

## 2019-07-13 ENCOUNTER — Encounter: Payer: Self-pay | Admitting: Internal Medicine

## 2019-07-13 DIAGNOSIS — F03C0 Unspecified dementia, severe, without behavioral disturbance, psychotic disturbance, mood disturbance, and anxiety: Secondary | ICD-10-CM | POA: Insufficient documentation

## 2019-07-13 DIAGNOSIS — N3281 Overactive bladder: Secondary | ICD-10-CM | POA: Insufficient documentation

## 2019-07-13 DIAGNOSIS — M170 Bilateral primary osteoarthritis of knee: Secondary | ICD-10-CM | POA: Insufficient documentation

## 2019-07-13 DIAGNOSIS — F0390 Unspecified dementia without behavioral disturbance: Secondary | ICD-10-CM | POA: Insufficient documentation

## 2019-07-15 ENCOUNTER — Encounter: Payer: Self-pay | Admitting: Internal Medicine

## 2019-07-15 ENCOUNTER — Encounter (HOSPITAL_COMMUNITY)
Admission: RE | Admit: 2019-07-15 | Discharge: 2019-07-15 | Disposition: A | Discharge status: Home / Self Care | Payer: Medicare HMO | Source: Skilled Nursing Facility | Attending: Adult Health | Admitting: Adult Health

## 2019-07-15 ENCOUNTER — Non-Acute Institutional Stay (SKILLED_NURSING_FACILITY): Payer: Medicare Other | Admitting: Internal Medicine

## 2019-07-15 DIAGNOSIS — R627 Adult failure to thrive: Secondary | ICD-10-CM | POA: Insufficient documentation

## 2019-07-15 DIAGNOSIS — N3281 Overactive bladder: Secondary | ICD-10-CM | POA: Diagnosis not present

## 2019-07-15 DIAGNOSIS — E039 Hypothyroidism, unspecified: Secondary | ICD-10-CM | POA: Diagnosis not present

## 2019-07-15 DIAGNOSIS — E876 Hypokalemia: Secondary | ICD-10-CM | POA: Insufficient documentation

## 2019-07-15 DIAGNOSIS — R569 Unspecified convulsions: Secondary | ICD-10-CM | POA: Diagnosis not present

## 2019-07-15 DIAGNOSIS — R32 Unspecified urinary incontinence: Secondary | ICD-10-CM | POA: Diagnosis not present

## 2019-07-15 DIAGNOSIS — K5909 Other constipation: Secondary | ICD-10-CM | POA: Insufficient documentation

## 2019-07-15 DIAGNOSIS — Z9181 History of falling: Secondary | ICD-10-CM | POA: Insufficient documentation

## 2019-07-15 DIAGNOSIS — N182 Chronic kidney disease, stage 2 (mild): Secondary | ICD-10-CM | POA: Insufficient documentation

## 2019-07-15 LAB — BASIC METABOLIC PANEL
Anion gap: 11 (ref 5–15)
BUN: 71 mg/dL — ABNORMAL HIGH (ref 8–23)
CO2: 29 mmol/L (ref 22–32)
Calcium: 9 mg/dL (ref 8.9–10.3)
Chloride: 99 mmol/L (ref 98–111)
Creatinine, Ser: 1.45 mg/dL — ABNORMAL HIGH (ref 0.44–1.00)
GFR calc Af Amer: 35 mL/min — ABNORMAL LOW (ref >60)
GFR calc non Af Amer: 30 mL/min — ABNORMAL LOW (ref >60)
Glucose, Bld: 102 mg/dL — ABNORMAL HIGH (ref 70–99)
Potassium: 4.7 mmol/L (ref 3.5–5.1)
Sodium: 139 mmol/L (ref 135–145)

## 2019-07-15 NOTE — Progress Notes (Signed)
Location:    [place] Room Number: 145/D Place of Service:  SNF [phone]) Provider:  Cory Roughen, MD  Patient Care Team: Celene Squibb, MD as PCP - General (Internal Medicine)  Extended Emergency Contact Information Primary Emergency Contact: Certain,[name] Address: [address]          [place], [place] [address] [place] Phone: [phone] Mobile Phone: [phone] Relation: Son Secondary Emergency Contact: Bleau,[name] Address: [address]          [place], [place] [address] [place] Phone: [phone] Mobile Phone: [phone] Relation: Other  Code Status:  Full Code Goals of care: Advanced Directive information Advanced Directives 07/15/2019  Does Patient Have a Medical Advance Directive? Yes  Type of Advance Directive (No Data)  Does patient want to make changes to medical advance directive? No - Patient declined  Would patient like information on creating a medical advance directive? -     Chief Complaint  Patient presents with  . Acute Visit     FTT---transfer to Hospice     HPI:  Pt is a 86 y.o. female seen today for an acute visit for follow-up of failure to thrive with transition to hospice.  She does have a history of dementia as well as hypothyroidism anemia-she had been admitted to skilled nursing after becoming increasingly weak at home at 1 point she was actually independent.  She continues have a decreased appetite and had difficulty keeping up with the intake.  She also developed pressure ulcers on both heels.  She was admitted to the hospital in late March for failure to thrive and generalized weakness with acute kidney injury on top of chronic kidney disease.  She did receive Levaquin as well as IV fluids and improved clinically.  She has been admitted back to skilled nursing with recommendation for hospice.  Apparently this has been completed.  Today she is sitting in  her wheelchair comfortably she does not appear to be having any pain she actually appears to be in good spirits she is bright alert very pleasant.  She does not really report any concerns     Past Medical History:  Diagnosis Date  . Thyroid disease    Past Surgical History:  Procedure Laterality Date  . ABDOMINAL HYSTERECTOMY    . TONSILLECTOMY    . TOTAL HIP ARTHROPLASTY     left    Allergies  Allergen Reactions  . Cephalexin Other (See Comments)    Pt. Doesn't remember.   . Cephalexin Other (See Comments)    Pt. Doesn't remember.   . Penicillins Other (See Comments)    Pt. Cant remember.     Outpatient Encounter Medications as of 07/15/2019  Medication Sig  . acetaminophen (TYLENOL) 650 MG CR tablet Take 650 mg by mouth every 6 (six) hours.  Roseanne Kaufman [place]-Castor Oil [facility]) OINT Special Instructions: Apply to sacrum and bilateral buttocks qshift for prevention. Every Shift Day, Evening, Night  . collagenase (SANTYL) ointment Apply 1 application topically daily. Special Instructions: Apply to wounds per tx order.   . diclofenac Sodium (VOLTAREN) 1 % GEL Apply 2 g topically every 6 (six) hours as needed. Apply topically to bilateral knees as needed for arthritic pain.  Marland Kitchen docusate sodium (COLACE) 100 MG capsule Take 100 mg by mouth 2 (two) times daily.  . feeding supplement, ENSURE ENLIVE, (ENSURE ENLIVE) LIQD Take 237 mLs by mouth daily.  Marland Kitchen levothyroxine (SYNTHROID) 100  MCG tablet Take 100 mcg by mouth daily before breakfast.  . mirabegron ER (MYRBETRIQ) 50 MG TB24 tablet Take 50 mg by mouth at bedtime.  . NON FORMULARY Diet: _____ Regular,  ___x___ NAS,  _______Consistent Carbohydrate,  _______NPO  _____Other  . traMADol (ULTRAM) 50 MG tablet Take 50 mg by mouth every 6 (six) hours as needed.   No facility-administered encounter medications on file as of 07/15/2019.    Review of Systems  This is limited secondary to dementia but she does not really have any  complaints when I speak to her-nursing is not really reported any issues.     Immunization History  Administered Date(s) Administered  . Influenza-Unspecified 02/09/2018   Pertinent  Health Maintenance Due  Topic Date Due  . DEXA SCAN  Never done  . PNA vac Low Risk Adult (1 of 2 - PCV13) Never done  . INFLUENZA VACCINE  11/10/2019   No flowsheet data found. Functional Status Survey:    Vitals:   07/15/19 1103  BP: 133/72  Pulse: 68  Resp: 20  Temp: (!) 97.2 F (36.2 C)  TempSrc: Oral  SpO2: 90%  Weight: 135 lb (61.2 kg)  Height: 5\' 1"  (1.549 m)   Body mass index is 25.51 kg/m. Physical Exam General this is a pleasant elderly resident who actually looks younger than her stated age-there is no sign of distress.  Her skin is warm and dry she does have covering over her heels bilaterally.  Eyes visual acuity appears to be intact sclera and conjunctive are clear.  Oropharynx is clear mucous membranes moist.  Chest is clear to auscultation there is no labored breathing.  Heart is regular rate and rhythm without murmur gallop or rub.  Her abdomen is soft nontender with positive bowel sounds.  Musculoskeletal appears able to move all extremities x4 somewhat limited exam since she is sitting in a wheelchair.  Neurologic she is alert could not appreciate lateralizing findings her speech is clear.  Psych she is pleasant and appropriate oriented to self does follow simple verbal commands Labs reviewed: Recent Labs    07/04/19 1123 07/04/19 2050 07/05/19 0540 07/15/19 0720  NA 141  --  144 139  K 5.3*  --  4.3 4.7  CL 102  --  107 99  CO2 25  --  25 29  GLUCOSE 190*  --  90 102*  BUN 97*  --  75* 71*  CREATININE 2.41*  --  1.57* 1.45*  CALCIUM 9.0  --  8.6* 9.0  MG  --  2.8*  --   --    Recent Labs    06/10/19 0313 07/04/19 1123  AST 19 42*  ALT 13 65*  ALKPHOS 72 158*  BILITOT 0.8 0.6  PROT 7.0 7.5  ALBUMIN 3.5 3.2*   Recent Labs    06/10/19  0313 07/04/19 1155 07/05/19 0540  WBC 5.9 10.4 7.8  NEUTROABS  --  8.3*  --   HGB 11.6* 11.5* 10.6*  HCT 37.2 36.6 34.3*  MCV 102.8* 103.1* 104.6*  PLT 237 214 190   Lab Results  Component Value Date   TSH 1.382 07/04/2019   No results found for: HGBA1C No results found for: CHOL, HDL, LDLCALC, LDLDIRECT, TRIG, CHOLHDL  Significant Diagnostic Results in last 30 days:  DG Abdomen Acute W/Chest  Result Date: 07/04/2019 CLINICAL DATA:  Weakness, pain EXAM: DG ABDOMEN ACUTE W/ 1V CHEST COMPARISON:  None. FINDINGS: Heart is mildly enlarged. Tortuous aorta with calcifications. Calcified  granuloma in the right upper lobe. No confluent opacities or effusions. Nonobstructive bowel gas pattern. No organomegaly or free air. Prior left hip replacement. Severe lumbar scoliosis. IMPRESSION: No evidence of bowel obstruction or free air. Cardiomegaly. No acute cardiopulmonary disease. Electronically Signed   By: Charlett Nose M.D.   On: 07/04/2019 23:40    Assessment/Plan #1 failure to thrive-she now is under hospice services-appears to be comfortable she does not appear to have pain there are orders for tramadol 50 mg every 6 hours as needed for pain she also has Voltaren gel every 6 hours as needed and Tylenol every 6 hours as needed  She continues on Ensure supplementation with meals I did encourage her to eat and drink and she seemed to express understanding there was actually some nourishment in front of her on the table.   #2 history of overactive bladder she continues on Myrbetriq 50 mg a day-she has just completed it appears antibiotic for UTI-she is not complaining of any dysuria or pain in this regards.  3.-History of hypothyroidism not stated as uncontrolled she is on Synthroid 100 mcg a day-secondary to be under hospice care will not be aggressive pursuing further labs.  4.  History of bilateral heel wounds-this is followed by wound care-- area is currently covered-- apparently she was  also on Levaquin for this as well  At this point she appears to be stable and relatively pain-free this will need to be monitored however.  P.o. intake also will need to be encouraged.  [id number]

## 2019-07-16 ENCOUNTER — Encounter: Payer: Self-pay | Admitting: Internal Medicine

## 2019-07-16 DIAGNOSIS — Z1159 Encounter for screening for other viral diseases: Secondary | ICD-10-CM | POA: Diagnosis not present

## 2019-07-16 DIAGNOSIS — D649 Anemia, unspecified: Secondary | ICD-10-CM | POA: Diagnosis not present

## 2019-07-17 ENCOUNTER — Encounter: Payer: Self-pay | Admitting: Adult Health

## 2019-07-17 ENCOUNTER — Other Ambulatory Visit: Payer: Self-pay | Admitting: Adult Health

## 2019-07-17 ENCOUNTER — Non-Acute Institutional Stay (SKILLED_NURSING_FACILITY): Payer: Medicare Other | Admitting: Adult Health

## 2019-07-17 DIAGNOSIS — M170 Bilateral primary osteoarthritis of knee: Secondary | ICD-10-CM | POA: Diagnosis not present

## 2019-07-17 DIAGNOSIS — L89620 Pressure ulcer of left heel, unstageable: Secondary | ICD-10-CM

## 2019-07-17 DIAGNOSIS — L89610 Pressure ulcer of right heel, unstageable: Secondary | ICD-10-CM

## 2019-07-17 MED ORDER — TRAMADOL HCL 50 MG PO TABS: 50.0000 mg | ORAL_TABLET | Freq: Four times a day (QID) | ORAL | 0 refills | Status: DC

## 2019-07-17 NOTE — Progress Notes (Signed)
Location:    [place] Room Number: 145/D Place of Service:  SNF (31)   CODE STATUS: Full Code  Allergies  Allergen Reactions  . Cephalexin Other (See Comments)    Pt. Doesn't remember.   . Cephalexin Other (See Comments)    Pt. Doesn't remember.   . Penicillins Other (See Comments)    Pt. Cant remember.     Chief Complaint  Patient presents with  . Acute Visit    Pain Management    HPI:  She is complaining of bilateral knee and feet pain. She has both voltaren gel and ultram ordered as needed. She is not getting adequate pain relief at this time. The staff feel as though she would benefit from these medications made routine.   Past Medical History:  Diagnosis Date  . Thyroid disease     Past Surgical History:  Procedure Laterality Date  . ABDOMINAL HYSTERECTOMY    . TONSILLECTOMY    . TOTAL HIP ARTHROPLASTY     left    Social History   Socioeconomic History  . Marital status: Widowed    Spouse name: Not on file  . Number of children: Not on file  . Years of education: Not on file  . Highest education level: Not on file  Occupational History  . Not on file  Tobacco Use  . Smoking status: Never Smoker  . Smokeless tobacco: Never Used  Substance and Sexual Activity  . Alcohol use: No  . Drug use: No  . Sexual activity: Not Currently  Other Topics Concern  . Not on file  Social History Narrative   Her granddaughter and her family live across the street. Family is very involved and supportive.   Social Determinants of Health   Financial Resource Strain:   . Difficulty of Paying Living Expenses:   Food Insecurity:   . Worried About Charity fundraiser in the Last Year:   . Arboriculturist in the Last Year:   Transportation Needs:   . Film/video editor (Medical):   Marland Kitchen Lack of Transportation (Non-Medical):   Physical Activity:   . Days of Exercise per Week:   . Minutes of Exercise per Session:   Stress:   . Feeling of  Stress :   Social Connections:   . Frequency of Communication with Friends and Family:   . Frequency of Social Gatherings with Friends and Family:   . Attends Religious Services:   . Active Member of Clubs or Organizations:   . Attends Archivist Meetings:   Marland Kitchen Marital Status:   Intimate Partner Violence:   . Fear of Current or Ex-Partner:   . Emotionally Abused:   Marland Kitchen Physically Abused:   . Sexually Abused:    Family History  Problem Relation Age of Onset  . Heart attack Mother   . Heart attack Father       VITAL SIGNS BP (!) 166/71   Pulse (!) 48   Temp 98 F (36.7 C) (Oral)   Resp 20   Ht _0  (1.549 m)   Wt 135 lb (61.2 kg)   SpO2 90%   BMI 25.51 kg/m   Outpatient Encounter Medications as of 07/17/2019  Medication Sig  . acetaminophen (TYLENOL) 650 MG CR tablet Take 650 mg by mouth every 6 (six) hours.  Roseanne Kaufman [place]-Castor Oil [facility]) OINT Special Instructions: Apply to sacrum and bilateral buttocks qshift for prevention. Every Shift Day, Evening, Night  .  collagenase (SANTYL) ointment Apply 1 application topically daily. Special Instructions: Apply to wounds per tx order.   . diclofenac Sodium (VOLTAREN) 1 % GEL Apply 2 g topically every 6 (six) hours as needed. Apply topically to bilateral knees as needed for arthritic pain.  Marland Kitchen docusate sodium (COLACE) 100 MG capsule Take 100 mg by mouth 2 (two) times daily.  . feeding supplement, ENSURE ENLIVE, (ENSURE ENLIVE) LIQD Take 237 mLs by mouth daily.  Marland Kitchen levothyroxine (SYNTHROID) 100 MCG tablet Take 100 mcg by mouth daily before breakfast.  . mirabegron ER (MYRBETRIQ) 50 MG TB24 tablet Take 50 mg by mouth at bedtime.  . NON FORMULARY Diet: _____ Regular,  ___x___ NAS,  _______Consistent Carbohydrate,  _______NPO  _____Other  . traMADol (ULTRAM) 50 MG tablet Take 1 tablet (50 mg total) by mouth every 6 (six) hours.   No facility-administered encounter medications on file as of 07/17/2019.     SIGNIFICANT  DIAGNOSTIC EXAMS   PREVIOUS  07-04-19: acute abdomen x-ray: No evidence of bowel obstruction or free air. Cardiomegaly  No acute cardiopulmonary disease.  NO NEW EXAMS.    LABS REVIEWED PREVIOUS  06-10-19: wbc 5.9; hgb 11.6; hct 37.2; mcv 102.8 plt 237 glucose 91; bun 50; creat 1.05 ;k+ 4.4; na++ 141; ca 9.1 liver normal albumin 3.5 07-04-19: wbc 10.4; hgb 11.5; hct 36.6; mcv 103.1 plt 214; glucose 190; bun 97; creat 2.41; k+ 5.3; na++ 141; ca 9.0 ast 42; alt 65; alk phos 158; albumin 3.2   07-04-19: vit B 12: 1014; iron 23;tibc 260 tsh 1.382 free T4: 1.66 urine culture: e-coli   NO NEW LABS.  Review of Systems  Constitutional: Negative for malaise/fatigue.  Respiratory: Negative for cough and shortness of breath.   Cardiovascular: Negative for chest pain, palpitations and leg swelling.  Gastrointestinal: Negative for abdominal pain, constipation and heartburn.  Musculoskeletal: Positive for joint pain. Negative for back pain and myalgias.       Bilateral feet and knee pain   Skin: Negative.   Neurological: Negative for dizziness.  Psychiatric/Behavioral: The patient is not nervous/anxious.     Physical Exam Constitutional:      General: She is not in acute distress.    Appearance: She is well-developed. She is not diaphoretic.  Neck:     Thyroid: No thyromegaly.  Cardiovascular:     Rate and Rhythm: Normal rate and regular rhythm.     Heart sounds: Normal heart sounds.     Comments: Unable to palpate pedal or post tib pulses  Pulmonary:     Effort: Pulmonary effort is normal. No respiratory distress.     Breath sounds: Normal breath sounds.  Abdominal:     General: Bowel sounds are normal. There is no distension.     Palpations: Abdomen is soft.     Tenderness: There is no abdominal tenderness.  Musculoskeletal:     Cervical back: Neck supple.     Right lower leg: No edema.     Left lower leg: No edema.     Comments: Is able to move all extremities    Lymphadenopathy:       Cervical: No cervical adenopathy.  Skin:    General: Skin is warm and dry.     Comments: Left heel: 3.5 x 3.5 cm Right knee: 7.0 x 5.0 cm  Neurological:     Mental Status: She is alert. Mental status is at baseline.  Psychiatric:        Mood and Affect: Mood normal.  ASSESSMENT/ PLAN:  TODAY  1. Primary osteoarthritis of both knees 2. Bilateral unstageable heel ulcerations  Pain is not managed Will change to:  Ultram 50 mg every 6 hours routinely voltaren gel 2 gm to both knees every 6 hours.     MD is aware of resident's narcotic use and is in agreement with current plan of care. We will attempt to wean resident as appropriate.  Ok Edwards NP [facility]  Contact [phone] Monday through Friday 8am- 5pm  After hours call [phone]

## 2019-07-22 ENCOUNTER — Non-Acute Institutional Stay (SKILLED_NURSING_FACILITY): Payer: Medicare Other | Admitting: Adult Health

## 2019-07-22 ENCOUNTER — Encounter: Payer: Self-pay | Admitting: Adult Health

## 2019-07-22 DIAGNOSIS — E039 Hypothyroidism, unspecified: Secondary | ICD-10-CM | POA: Diagnosis not present

## 2019-07-22 DIAGNOSIS — N3281 Overactive bladder: Secondary | ICD-10-CM | POA: Diagnosis not present

## 2019-07-22 DIAGNOSIS — R627 Adult failure to thrive: Secondary | ICD-10-CM

## 2019-07-22 NOTE — Progress Notes (Signed)
Location:    [place] Room Number: 145/D Place of Service:  SNF (31)   CODE STATUS: Full Code  Allergies  Allergen Reactions  . Cephalexin Other (See Comments)    Pt. Doesn't remember.   . Cephalexin Other (See Comments)    Pt. Doesn't remember.   . Penicillins Other (See Comments)    Pt. Cant remember.     Chief Complaint  Patient presents with  . Medical Management of Chronic Issues         Failure th thrive adult:   Urinary incontinence in female Acquired hypothyroidism:  Weekly follow up for the first 30 days post hospitalization.      HPI:  She is a 86 year old long term resident of this facility being seen for the management of her chronic illnesses; ftt; ui; hypothyroidism. There are no reports of uncontrolled pain; her appetite is good; no reports of constipation or insomnia.   Past Medical History:  Diagnosis Date  . Thyroid disease     Past Surgical History:  Procedure Laterality Date  . ABDOMINAL HYSTERECTOMY    . TONSILLECTOMY    . TOTAL HIP ARTHROPLASTY     left    Social History   Socioeconomic History  . Marital status: Widowed    Spouse name: Not on file  . Number of children: Not on file  . Years of education: Not on file  . Highest education level: Not on file  Occupational History  . Not on file  Tobacco Use  . Smoking status: Never Smoker  . Smokeless tobacco: Never Used  Substance and Sexual Activity  . Alcohol use: No  . Drug use: No  . Sexual activity: Not Currently  Other Topics Concern  . Not on file  Social History Narrative   Her granddaughter and her family live across the street. Family is very involved and supportive.   Social Determinants of Health   Financial Resource Strain:   . Difficulty of Paying Living Expenses:   Food Insecurity:   . Worried About Charity fundraiser in the Last Year:   . Arboriculturist in the Last Year:   Transportation Needs:   . Film/video editor (Medical):    Marland Kitchen Lack of Transportation (Non-Medical):   Physical Activity:   . Days of Exercise per Week:   . Minutes of Exercise per Session:   Stress:   . Feeling of Stress :   Social Connections:   . Frequency of Communication with Friends and Family:   . Frequency of Social Gatherings with Friends and Family:   . Attends Religious Services:   . Active Member of Clubs or Organizations:   . Attends Archivist Meetings:   Marland Kitchen Marital Status:   Intimate Partner Violence:   . Fear of Current or Ex-Partner:   . Emotionally Abused:   Marland Kitchen Physically Abused:   . Sexually Abused:    Family History  Problem Relation Age of Onset  . Heart attack Mother   . Heart attack Father       VITAL SIGNS BP 114/75   Pulse 92   Temp 97.9 F (36.6 C) (Oral)   Resp 20   Ht 5' 1"  (1.549 m)   Wt 135 lb (61.2 kg)   SpO2 90%   BMI 25.51 kg/m   Outpatient Encounter Medications as of 07/22/2019  Medication Sig  . acetaminophen (TYLENOL) 650 MG CR tablet Take 650 mg by mouth every  6 (six) hours.  Roseanne Kaufman [place]Cast[employer] [facility]) OINT Special Instructions: Apply to sacrum and bilateral buttocks qshift for prevention. Every Shift Day, Evening, Night  . collagenase (SANTYL) ointment Apply 1 application topically daily. Special Instructions: Apply to wounds per tx order.   . diclofenac Sodium (VOLTAREN) 1 % GEL Apply 2 g topically every 6 (six) hours as needed. Apply topically to bilateral knees as needed for arthritic pain.  Marland Kitchen docusate sodium (COLACE) 100 MG capsule Take 100 mg by mouth 2 (two) times daily.  . feeding supplement, ENSURE ENLIVE, (ENSURE ENLIVE) LIQD Take 237 mLs by mouth 3 (three) times daily between meals.   Marland Kitchen levothyroxine (SYNTHROID) 100 MCG tablet Take 100 mcg by mouth daily before breakfast.  . mirabegron ER (MYRBETRIQ) 50 MG TB24 tablet Take 50 mg by mouth at bedtime.  . NON FORMULARY Diet: _____ Regular,  ___x___ NAS,  _______Consistent Carbohydrate,  _______NPO  _____Other    . traMADol (ULTRAM) 50 MG tablet Take 1 tablet (50 mg total) by mouth every 6 (six) hours.   No facility-administered encounter medications on file as of 07/22/2019.     SIGNIFICANT DIAGNOSTIC EXAMS   TODAY  07-04-19: acute abdomen x-ray: No evidence of bowel obstruction or free air. Cardiomegaly  No acute cardiopulmonary disease.   LABS REVIEWED PREVIOUS  06-10-19: wbc 5.9; hgb 11.6; hct 37.2; mcv 102.8 plt 237 glucose 91; bun 50; creat 1.05 ;k+ 4.4; na++ 141; ca 9.1 liver normal albumin 3.5 07-04-19: wbc 10.4; hgb 11.5; hct 36.6; mcv 103.1 plt 214; glucose 190; bun 97; creat 2.41; k+ 5.3; na++ 141; ca 9.0 ast 42; alt 65; alk phos 158; albumin 3.2   07-04-19: vit B 12: 1014; iron 23;tibc 260 tsh 1.382 free T4: 1.66 urine culture: e-coli   TODAY  07-15-19: glucose 102; bun 71; creat 1.45; k+ 4.7; na++ 139; ca 9.0   Review of Systems  Constitutional: Negative for malaise/fatigue.  Respiratory: Negative for cough and shortness of breath.   Cardiovascular: Negative for chest pain, palpitations and leg swelling.  Gastrointestinal: Negative for abdominal pain, constipation and heartburn.  Musculoskeletal: Negative for back pain, joint pain and myalgias.  Skin: Negative.   Neurological: Negative for dizziness.  Psychiatric/Behavioral: The patient is not nervous/anxious.       Physical Exam Constitutional:      General: She is not in acute distress.    Appearance: She is well-developed. She is not diaphoretic.  Neck:     Thyroid: No thyromegaly.  Cardiovascular:     Rate and Rhythm: Normal rate and regular rhythm.     Heart sounds: Normal heart sounds.     Comments: Unable to palpate pedal or post tib pulses  Pulmonary:     Effort: Pulmonary effort is normal. No respiratory distress.     Breath sounds: Normal breath sounds.  Abdominal:     General: Bowel sounds are normal. There is no distension.     Palpations: Abdomen is soft.     Tenderness: There is no abdominal tenderness.   Musculoskeletal:     Cervical back: Neck supple.     Right lower leg: No edema.     Left lower leg: No edema.     Comments: Is able to move all extremities   Lymphadenopathy:     Cervical: No cervical adenopathy.  Skin:    General: Skin is warm and dry.     Comments: Left heel: 3.2 x 3.5 cm Right heel: 4 x 3 cm   Neurological:  Mental Status: She is alert. Mental status is at baseline.  Psychiatric:        Mood and Affect: Mood normal.    ASSESSMENT/ PLAN:  TODAY  1.  Failure th thrive adult: is presently stable her weight is good at 135 pounds will continue to monitor her status will continue supplements as directed she is unable to tolerate remeron.   2. Urinary incontinence in female is stable will continue mybertriq 50 mg daily   3. Acquired hypothyroidism: is stable tsh 1.382 will continue synthroid 100 mcg daily   PREVIOUS  4. Chronic constipation: is stable will continue colace twice daily   5. Hypokalemia: is stable k+ is 4.3 will continue to moniotorher status.    6. CKD stage 3b: is without change bun 71 creat 1.45 will monitor   7. Bilateral unstaged heel ulcerations: is without change: will continue treatments as indicated and will monitor her status          MD is aware of resident's narcotic use and is in agreement with current plan of care. We will attempt to wean resident as appropriate.  Ok Edwards NP [facility]  Contact [phone] Monday through Friday 8am- 5pm  After hours call [phone]

## 2019-07-23 ENCOUNTER — Non-Acute Institutional Stay (SKILLED_NURSING_FACILITY): Payer: Medicare Other | Admitting: Adult Health

## 2019-07-23 ENCOUNTER — Other Ambulatory Visit: Payer: Self-pay | Admitting: Adult Health

## 2019-07-23 ENCOUNTER — Encounter: Payer: Self-pay | Admitting: Adult Health

## 2019-07-23 DIAGNOSIS — N3281 Overactive bladder: Secondary | ICD-10-CM

## 2019-07-23 DIAGNOSIS — L89610 Pressure ulcer of right heel, unstageable: Secondary | ICD-10-CM | POA: Diagnosis not present

## 2019-07-23 DIAGNOSIS — L89620 Pressure ulcer of left heel, unstageable: Secondary | ICD-10-CM | POA: Diagnosis not present

## 2019-07-23 MED ORDER — TRAMADOL HCL 50 MG PO TABS: 50.0000 mg | ORAL_TABLET | Freq: Four times a day (QID) | ORAL | 0 refills | Status: DC | PRN

## 2019-07-23 NOTE — Progress Notes (Signed)
Location:    [place] Room Number: 145/D Place of Service:  SNF (31)   CODE STATUS: Full Code  Allergies  Allergen Reactions  . Cephalexin Other (See Comments)    Pt. Doesn't remember.   . Cephalexin Other (See Comments)    Pt. Doesn't remember.   . Penicillins Other (See Comments)    Pt. Cant remember.     Chief Complaint  Patient presents with  . Acute Visit    Pain Management    HPI:  She continues to be followed by hospice care. She is taking routine tylenol and ultram for her pain relief. This regimen is effective. She is more confused and agitated since starting the ultram. She has been packing her clothes stating that she is going to home.   Past Medical History:  Diagnosis Date  . Thyroid disease     Past Surgical History:  Procedure Laterality Date  . ABDOMINAL HYSTERECTOMY    . TONSILLECTOMY    . TOTAL HIP ARTHROPLASTY     left    Social History   Socioeconomic History  . Marital status: Widowed    Spouse name: Not on file  . Number of children: Not on file  . Years of education: Not on file  . Highest education level: Not on file  Occupational History  . Not on file  Tobacco Use  . Smoking status: Never Smoker  . Smokeless tobacco: Never Used  Substance and Sexual Activity  . Alcohol use: No  . Drug use: No  . Sexual activity: Not Currently  Other Topics Concern  . Not on file  Social History Narrative   Her granddaughter and her family live across the street. Family is very involved and supportive.   Social Determinants of Health   Financial Resource Strain:   . Difficulty of Paying Living Expenses:   Food Insecurity:   . Worried About Charity fundraiser in the Last Year:   . Arboriculturist in the Last Year:   Transportation Needs:   . Film/video editor (Medical):   Marland Kitchen Lack of Transportation (Non-Medical):   Physical Activity:   . Days of Exercise per Week:   . Minutes of Exercise per Session:     Stress:   . Feeling of Stress :   Social Connections:   . Frequency of Communication with Friends and Family:   . Frequency of Social Gatherings with Friends and Family:   . Attends Religious Services:   . Active Member of Clubs or Organizations:   . Attends Archivist Meetings:   Marland Kitchen Marital Status:   Intimate Partner Violence:   . Fear of Current or Ex-Partner:   . Emotionally Abused:   Marland Kitchen Physically Abused:   . Sexually Abused:    Family History  Problem Relation Age of Onset  . Heart attack Mother   . Heart attack Father       VITAL SIGNS BP 114/75   Pulse 90   Temp 98.4 F (36.9 C) (Oral)   Resp 20   Ht 5' 1"  (1.549 m)   Wt 135 lb (61.2 kg)   SpO2 90%   BMI 25.51 kg/m   Outpatient Encounter Medications as of 07/23/2019  Medication Sig  . acetaminophen (TYLENOL) 650 MG CR tablet Take 650 mg by mouth every 6 (six) hours.  Roseanne Kaufman [place] [facility]) OINT Special Instructions: Apply to sacrum and bilateral buttocks qshift for prevention. Every Shift Day, Evening,  Night  . collagenase (SANTYL) ointment Apply 1 application topically daily. Special Instructions: Apply to wounds per tx order.   . diclofenac Sodium (VOLTAREN) 1 % GEL Apply 2 g topically every 6 (six) hours as needed. Apply topically to bilateral knees as needed for arthritic pain.  Marland Kitchen docusate sodium (COLACE) 100 MG capsule Take 100 mg by mouth 2 (two) times daily.  . feeding supplement, ENSURE ENLIVE, (ENSURE ENLIVE) LIQD Take 237 mLs by mouth 3 (three) times daily between meals.   Marland Kitchen levothyroxine (SYNTHROID) 100 MCG tablet Take 100 mcg by mouth daily before breakfast.  . mirabegron ER (MYRBETRIQ) 50 MG TB24 tablet Take 50 mg by mouth at bedtime.  . NON FORMULARY Diet: _____ Regular,  ___x___ NAS,  _______Consistent Carbohydrate,  _______NPO  _____Other  . traMADol (ULTRAM) 50 MG tablet Take 1 tablet (50 mg total) by mouth every 6 (six) hours.   No facility-administered encounter  medications on file as of 07/23/2019.     SIGNIFICANT DIAGNOSTIC EXAMS   TODAY  07-04-19: acute abdomen x-ray: No evidence of bowel obstruction or free air. Cardiomegaly  No acute cardiopulmonary disease.   LABS REVIEWED PREVIOUS  06-10-19: wbc 5.9; hgb 11.6; hct 37.2; mcv 102.8 plt 237 glucose 91; bun 50; creat 1.05 ;k+ 4.4; na++ 141; ca 9.1 liver normal albumin 3.5 07-04-19: wbc 10.4; hgb 11.5; hct 36.6; mcv 103.1 plt 214; glucose 190; bun 97; creat 2.41; k+ 5.3; na++ 141; ca 9.0 ast 42; alt 65; alk phos 158; albumin 3.2   07-04-19: vit B 12: 1014; iron 23;tibc 260 tsh 1.382 free T4: 1.66 urine culture: e-coli  07-15-19: glucose 102; bun 71; creat 1.45; k+ 4.7; na++ 139; ca 9.0   NO NEW LABS.   Review of Systems  Constitutional: Negative for malaise/fatigue.  Respiratory: Negative for cough and shortness of breath.   Cardiovascular: Negative for chest pain, palpitations and leg swelling.  Gastrointestinal: Negative for abdominal pain, constipation and heartburn.  Musculoskeletal: Negative for back pain, joint pain and myalgias.  Skin: Negative.   Neurological: Negative for dizziness.  Psychiatric/Behavioral: The patient is not nervous/anxious.     Physical Exam Constitutional:      General: She is not in acute distress.    Appearance: She is well-developed. She is not diaphoretic.  Neck:     Thyroid: No thyromegaly.  Cardiovascular:     Rate and Rhythm: Normal rate and regular rhythm.     Heart sounds: Normal heart sounds.     Comments: Unable to palpate pedal or post tib pulses  Pulmonary:     Effort: Pulmonary effort is normal. No respiratory distress.     Breath sounds: Normal breath sounds.  Abdominal:     General: Bowel sounds are normal. There is no distension.     Palpations: Abdomen is soft.     Tenderness: There is no abdominal tenderness.  Musculoskeletal:     Cervical back: Neck supple.     Right lower leg: No edema.     Left lower leg: No edema.      Comments: Is able to move all extremities   Lymphadenopathy:     Cervical: No cervical adenopathy.  Skin:    General: Skin is warm and dry.     Comments: Left heel: 3.2 x 3.5 cm Right heel: 4 x 3 cm    Neurological:     Mental Status: She is alert. Mental status is at baseline.  Psychiatric:        Mood and  Affect: Mood normal.         ASSESSMENT/ PLAN:  TODAY  1. Bilateral heel ulcerations unstageable 2. Over active bladder  Will stop myrbetriq  Will change ultram to every 6 hours as needed due to her confusion Will continue to monitor her status If she continues to have pain will need to consider other narcotics.     MD is aware of resident's narcotic use and is in agreement with current plan of care. We will attempt to wean resident as appropriate.  Ok Edwards NP [facility]  Contact [phone] Monday through Friday 8am- 5pm  After hours call [phone]

## 2019-07-25 ENCOUNTER — Non-Acute Institutional Stay (SKILLED_NURSING_FACILITY): Payer: Medicare Other | Admitting: Adult Health

## 2019-07-25 ENCOUNTER — Encounter: Payer: Self-pay | Admitting: Adult Health

## 2019-07-25 ENCOUNTER — Other Ambulatory Visit: Payer: Self-pay | Admitting: Adult Health

## 2019-07-25 DIAGNOSIS — L89610 Pressure ulcer of right heel, unstageable: Secondary | ICD-10-CM

## 2019-07-25 DIAGNOSIS — F0390 Unspecified dementia without behavioral disturbance: Secondary | ICD-10-CM

## 2019-07-25 DIAGNOSIS — R69 Illness, unspecified: Secondary | ICD-10-CM | POA: Diagnosis not present

## 2019-07-25 DIAGNOSIS — L89620 Pressure ulcer of left heel, unstageable: Secondary | ICD-10-CM

## 2019-07-25 DIAGNOSIS — N1832 Chronic kidney disease, stage 3b: Secondary | ICD-10-CM | POA: Diagnosis not present

## 2019-07-25 DIAGNOSIS — F03C0 Unspecified dementia, severe, without behavioral disturbance, psychotic disturbance, mood disturbance, and anxiety: Secondary | ICD-10-CM

## 2019-07-25 MED ORDER — TRAMADOL HCL 50 MG PO TABS: 50.0000 mg | ORAL_TABLET | Freq: Four times a day (QID) | ORAL | 0 refills | Status: DC

## 2019-07-25 NOTE — Progress Notes (Signed)
Location:    [place] Room Number: 145/D Place of Service:  SNF (31)   CODE STATUS: dnr   Allergies  Allergen Reactions  . Cephalexin Other (See Comments)    Pt. Doesn't remember.   . Cephalexin Other (See Comments)    Pt. Doesn't remember.   . Penicillins Other (See Comments)    Pt. Cant remember.     Chief Complaint  Patient presents with  . Advanced Directive    Care Plan Meeting    HPI:  We have come together for her care plan meeting. BIMS 8/15 mood 7/30. Family present. Her weight is stable. She has had one fall without injury. She is unable to tolerate ultram due to increased confusion and delusions. She continues to require extensive assistance with her adls. Se is incontinent she continues to be followed by hospice care. She continues to be followed for her chronic illnesses including: dementia; heel ulcerations; ckd.   Past Medical History:  Diagnosis Date  . Thyroid disease     Past Surgical History:  Procedure Laterality Date  . ABDOMINAL HYSTERECTOMY    . TONSILLECTOMY    . TOTAL HIP ARTHROPLASTY     left    Social History   Socioeconomic History  . Marital status: Widowed    Spouse name: Not on file  . Number of children: Not on file  . Years of education: Not on file  . Highest education level: Not on file  Occupational History  . Not on file  Tobacco Use  . Smoking status: Never Smoker  . Smokeless tobacco: Never Used  Substance and Sexual Activity  . Alcohol use: No  . Drug use: No  . Sexual activity: Not Currently  Other Topics Concern  . Not on file  Social History Narrative   Her granddaughter and her family live across the street. Family is very involved and supportive.   Social Determinants of Health   Financial Resource Strain:   . Difficulty of Paying Living Expenses:   Food Insecurity:   . Worried About Charity fundraiser in the Last Year:   . Arboriculturist in the Last Year:   Transportation  Needs:   . Film/video editor (Medical):   Marland Kitchen Lack of Transportation (Non-Medical):   Physical Activity:   . Days of Exercise per Week:   . Minutes of Exercise per Session:   Stress:   . Feeling of Stress :   Social Connections:   . Frequency of Communication with Friends and Family:   . Frequency of Social Gatherings with Friends and Family:   . Attends Religious Services:   . Active Member of Clubs or Organizations:   . Attends Archivist Meetings:   Marland Kitchen Marital Status:   Intimate Partner Violence:   . Fear of Current or Ex-Partner:   . Emotionally Abused:   Marland Kitchen Physically Abused:   . Sexually Abused:    Family History  Problem Relation Age of Onset  . Heart attack Mother   . Heart attack Father       VITAL SIGNS BP 114/75   Pulse 90   Temp (!) 97.1 F (36.2 C) (Oral)   Resp 20   Ht 5' 1"  (1.549 m)   Wt 135 lb (61.2 kg)   SpO2 90%   BMI 25.51 kg/m   Outpatient Encounter Medications as of 07/25/2019  Medication Sig  . acetaminophen (TYLENOL) 650 MG CR tablet Take 650 mg by  mouth every 6 (six) hours.  Roseanne Kaufman [place] [facility]) OINT Special Instructions: Apply to sacrum and bilateral buttocks qshift for prevention. Every Shift Day, Evening, Night  . collagenase (SANTYL) ointment Apply 1 application topically daily. Special Instructions: Apply to wounds per tx order.   . diclofenac Sodium (VOLTAREN) 1 % GEL Apply 2 g topically every 6 (six) hours as needed. Apply topically to bilateral knees as needed for arthritic pain.  Marland Kitchen docusate sodium (COLACE) 100 MG capsule Take 100 mg by mouth 2 (two) times daily.  . feeding supplement, ENSURE ENLIVE, (ENSURE ENLIVE) LIQD Take 237 mLs by mouth 3 (three) times daily between meals.   Marland Kitchen levothyroxine (SYNTHROID) 100 MCG tablet Take 100 mcg by mouth daily before breakfast.  . NON FORMULARY Diet: _____ Regular,  ___x___ NAS,  _______Consistent Carbohydrate,  _______NPO  _____Other  . oxyCODONE (OXY  IR/ROXICODONE) 5 MG immediate release tablet Take 5 mg by mouth every 6 (six) hours as needed for severe pain.  . [DISCONTINUED] mirabegron ER (MYRBETRIQ) 50 MG TB24 tablet Take 50 mg by mouth at bedtime.   No facility-administered encounter medications on file as of 07/25/2019.     SIGNIFICANT DIAGNOSTIC EXAMS  PREVIOUS  07-04-19: acute abdomen x-ray: No evidence of bowel obstruction or free air. Cardiomegaly  No acute cardiopulmonary disease.  NO NEW EXAMS.    LABS REVIEWED PREVIOUS  06-10-19: wbc 5.9; hgb 11.6; hct 37.2; mcv 102.8 plt 237 glucose 91; bun 50; creat 1.05 ;k+ 4.4; na++ 141; ca 9.1 liver normal albumin 3.5 07-04-19: wbc 10.4; hgb 11.5; hct 36.6; mcv 103.1 plt 214; glucose 190; bun 97; creat 2.41; k+ 5.3; na++ 141; ca 9.0 ast 42; alt 65; alk phos 158; albumin 3.2   07-04-19: vit B 12: 1014; iron 23;tibc 260 tsh 1.382 free T4: 1.66 urine culture: e-coli  07-15-19: glucose 102; bun 71; creat 1.45; k+ 4.7; na++ 139; ca 9.0   NO NEW LABS.   Review of Systems  Constitutional: Negative for malaise/fatigue.  Respiratory: Negative for cough and shortness of breath.   Cardiovascular: Negative for chest pain, palpitations and leg swelling.  Gastrointestinal: Negative for abdominal pain, constipation and heartburn.  Musculoskeletal: Positive for joint pain. Negative for back pain and myalgias.       Feet hurt   Skin: Negative.   Neurological: Negative for dizziness.  Psychiatric/Behavioral: The patient is not nervous/anxious.    Physical Exam Constitutional:      General: She is not in acute distress.    Appearance: She is well-developed. She is not diaphoretic.  Neck:     Thyroid: No thyromegaly.  Cardiovascular:     Rate and Rhythm: Normal rate and regular rhythm.     Heart sounds: Normal heart sounds.     Comments: Unable to palpate pedal or post tib pulses  Pulmonary:     Effort: Pulmonary effort is normal. No respiratory distress.     Breath sounds: Normal breath  sounds.  Abdominal:     General: Bowel sounds are normal. There is no distension.     Palpations: Abdomen is soft.     Tenderness: There is no abdominal tenderness.  Musculoskeletal:     Cervical back: Neck supple.     Right lower leg: No edema.     Left lower leg: No edema.     Comments: Is able to move all extremities   Lymphadenopathy:     Cervical: No cervical adenopathy.  Skin:    General: Skin is warm and  dry.     Comments: Left heel: 3.2 x 3.5 cm Right heel: 4 x 3 cm     Neurological:     Mental Status: She is alert. Mental status is at baseline.  Psychiatric:        Mood and Affect: Mood normal.       ASSESSMENT/ PLAN:  TODAY  1. Advanced dementia 2. Bilateral heel ulcerations unstagable 3. ckd stage 3b  Will stop ultram Will begin oxycodone 5 mg every 6 hours as needed for pain Will continue current plan of care Will continue to monitor her status.      MD is aware of resident's narcotic use and is in agreement with current plan of care. We will attempt to wean resident as appropriate.  Ok Edwards NP [facility]  Contact [phone] Monday through Friday 8am- 5pm  After hours call [phone]

## 2019-07-26 ENCOUNTER — Other Ambulatory Visit: Payer: Self-pay | Admitting: Adult Health

## 2019-07-26 ENCOUNTER — Other Ambulatory Visit (HOSPITAL_COMMUNITY)
Admission: RE | Admit: 2019-07-26 | Discharge: 2019-07-26 | Disposition: A | Discharge status: Home / Self Care | Payer: Medicare HMO | Source: Skilled Nursing Facility | Attending: Adult Health | Admitting: Adult Health

## 2019-07-26 DIAGNOSIS — E039 Hypothyroidism, unspecified: Secondary | ICD-10-CM | POA: Insufficient documentation

## 2019-07-26 LAB — TSH: TSH: 2.447 u[IU]/mL (ref 0.350–4.500)

## 2019-07-29 ENCOUNTER — Non-Acute Institutional Stay (SKILLED_NURSING_FACILITY): Payer: Medicare Other | Admitting: Adult Health

## 2019-07-29 ENCOUNTER — Other Ambulatory Visit: Payer: Self-pay | Admitting: Adult Health

## 2019-07-29 ENCOUNTER — Encounter: Payer: Self-pay | Admitting: Adult Health

## 2019-07-29 DIAGNOSIS — E876 Hypokalemia: Secondary | ICD-10-CM | POA: Diagnosis not present

## 2019-07-29 DIAGNOSIS — N1832 Chronic kidney disease, stage 3b: Secondary | ICD-10-CM

## 2019-07-29 DIAGNOSIS — K5909 Other constipation: Secondary | ICD-10-CM

## 2019-07-29 MED ORDER — OXYCODONE HCL 5 MG PO TABS: 5.0000 mg | ORAL_TABLET | Freq: Four times a day (QID) | ORAL | 0 refills | Status: DC | PRN

## 2019-07-29 NOTE — Progress Notes (Signed)
Location:    [place] Room Number: 145/D Place of Service:  SNF (31)   CODE STATUS: Full Code  Allergies  Allergen Reactions  . Cephalexin Other (See Comments)    Pt. Doesn't remember.   . Cephalexin Other (See Comments)    Pt. Doesn't remember.   . Penicillins Other (See Comments)    Pt. Cant remember.     Chief Complaint  Patient presents with  . Medical Management of Chronic Issues          Chronic constipation:   Hypokalemia:  CKD stage 3b:  Weekly follow up for the first 30 days post hospitalization.     HPI:  She is a 86 year old long term resident of this facility being seen for the management of her chronic illnesses: constipation; hypokalemia; ckd stage 3b. There are no reports of changes in appetite; no reports of agitation or anxiety; no reports of constipation; no reports of uncontrolled pain.   Past Medical History:  Diagnosis Date  . Thyroid disease     Past Surgical History:  Procedure Laterality Date  . ABDOMINAL HYSTERECTOMY    . TONSILLECTOMY    . TOTAL HIP ARTHROPLASTY     left    Social History   Socioeconomic History  . Marital status: Widowed    Spouse name: Not on file  . Number of children: Not on file  . Years of education: Not on file  . Highest education level: Not on file  Occupational History  . Not on file  Tobacco Use  . Smoking status: Never Smoker  . Smokeless tobacco: Never Used  Substance and Sexual Activity  . Alcohol use: No  . Drug use: No  . Sexual activity: Not Currently  Other Topics Concern  . Not on file  Social History Narrative   Her granddaughter and her family live across the street. Family is very involved and supportive.   Social Determinants of Health   Financial Resource Strain:   . Difficulty of Paying Living Expenses:   Food Insecurity:   . Worried About Charity fundraiser in the Last Year:   . Arboriculturist in the Last Year:   Transportation Needs:   . Lexicographer (Medical):   Marland Kitchen Lack of Transportation (Non-Medical):   Physical Activity:   . Days of Exercise per Week:   . Minutes of Exercise per Session:   Stress:   . Feeling of Stress :   Social Connections:   . Frequency of Communication with Friends and Family:   . Frequency of Social Gatherings with Friends and Family:   . Attends Religious Services:   . Active Member of Clubs or Organizations:   . Attends Archivist Meetings:   Marland Kitchen Marital Status:   Intimate Partner Violence:   . Fear of Current or Ex-Partner:   . Emotionally Abused:   Marland Kitchen Physically Abused:   . Sexually Abused:    Family History  Problem Relation Age of Onset  . Heart attack Mother   . Heart attack Father       VITAL SIGNS BP (!) 147/85   Pulse 84   Temp 98.8 F (37.1 C) (Oral)   Resp 20   Ht 5' 1"  (1.549 m)   Wt 135 lb (61.2 kg)   SpO2 90%   BMI 25.51 kg/m   Outpatient Encounter Medications as of 07/29/2019  Medication Sig  . acetaminophen (TYLENOL) 650 MG CR tablet  Take 650 mg by mouth every 6 (six) hours.  Roseanne Kaufman [employer] [facility]) OINT Special Instructions: Apply to sacrum and bilateral buttocks qshift for prevention. Every Shift Day, Evening, Night  . collagenase (SANTYL) ointment Apply 1 application topically daily. Special Instructions: Apply to wounds per tx order.   . diclofenac Sodium (VOLTAREN) 1 % GEL Apply 2 g topically every 6 (six) hours as needed. Apply topically to bilateral knees as needed for arthritic pain.  Marland Kitchen docusate sodium (COLACE) 100 MG capsule Take 100 mg by mouth 2 (two) times daily.  . feeding supplement, ENSURE ENLIVE, (ENSURE ENLIVE) LIQD Take 237 mLs by mouth with breakfast, with lunch, and with evening meal.   . levothyroxine (SYNTHROID) 100 MCG tablet Take 100 mcg by mouth daily before breakfast.  . NON FORMULARY Diet: _____ Regular,  ___x___ NAS,  _______Consistent Carbohydrate,  _______NPO  _____Other  .  oxyCODONE (OXY IR/ROXICODONE) 5  MG immediate release tablet Take 5 mg by mouth every 6 (six) hours as needed for severe pain.   No facility-administered encounter medications on file as of 07/29/2019.     SIGNIFICANT DIAGNOSTIC EXAMS  PREVIOUS  07-04-19: acute abdomen x-ray: No evidence of bowel obstruction or free air. Cardiomegaly  No acute cardiopulmonary disease.  NO NEW EXAMS.    LABS REVIEWED PREVIOUS  06-10-19: wbc 5.9; hgb 11.6; hct 37.2; mcv 102.8 plt 237 glucose 91; bun 50; creat 1.05 ;k+ 4.4; na++ 141; ca 9.1 liver normal albumin 3.5 07-04-19: wbc 10.4; hgb 11.5; hct 36.6; mcv 103.1 plt 214; glucose 190; bun 97; creat 2.41; k+ 5.3; na++ 141; ca 9.0 ast 42; alt 65; alk phos 158; albumin 3.2   07-04-19: vit B 12: 1014; iron 23;tibc 260 tsh 1.382 free T4: 1.66 urine culture: e-coli  07-15-19: glucose 102; bun 71; creat 1.45; k+ 4.7; na++ 139; ca 9.0   NO NEW LABS.    Review of Systems  Constitutional: Negative for malaise/fatigue.  Respiratory: Negative for cough and shortness of breath.   Cardiovascular: Negative for chest pain, palpitations and leg swelling.  Gastrointestinal: Negative for abdominal pain, constipation and heartburn.  Musculoskeletal: Negative for back pain, joint pain and myalgias.  Skin: Negative.   Neurological: Negative for dizziness.  Psychiatric/Behavioral: The patient is not nervous/anxious.      Physical Exam Constitutional:      General: She is not in acute distress.    Appearance: She is well-developed. She is not diaphoretic.  Neck:     Thyroid: No thyromegaly.  Cardiovascular:     Rate and Rhythm: Normal rate and regular rhythm.     Heart sounds: Normal heart sounds.     Comments: Unable to palpate pedal or post tib pulses  Pulmonary:     Effort: Pulmonary effort is normal. No respiratory distress.     Breath sounds: Normal breath sounds.  Abdominal:     General: Bowel sounds are normal. There is no distension.     Palpations: Abdomen is soft.     Tenderness: There  is no abdominal tenderness.  Musculoskeletal:     Cervical back: Neck supple.     Right lower leg: No edema.     Left lower leg: No edema.     Comments: Is able to move all extremities    Lymphadenopathy:     Cervical: No cervical adenopathy.  Skin:    General: Skin is warm and dry.     Comments:  Left heel: 3.2 x 3.5 cm Right heel: 4 x  3 cm      Neurological:     Mental Status: She is alert. Mental status is at baseline.  Psychiatric:        Mood and Affect: Mood normal.         ASSESSMENT/ PLAN:  TODAY  1. Chronic constipation: is stable will continue colace twice daily   2. Hypokalemia: is stable k+ 4.3 will continue to monitor   3. CKD stage 3b: is without change bun 71; creat 1.45 will monitor    PREVIOUS  4. Bilateral unstaged heel ulcerations: is without change: will continue oxycodone 5 mg every 6 hours as needed will continue treatments as indicated and will monitor her status  5.  Failure to thrive adult: is presently stable her weight is good at 135 pounds will continue to monitor her status will continue supplements as directed she is unable to tolerate remeron.   6. Urinary incontinence in female is stable will continue mybertriq 50 mg daily   7. Acquired hypothyroidism: is stable tsh 1.382 will continue synthroid 100 mcg daily    MD is aware of resident's narcotic use and is in agreement with current plan of care. We will attempt to wean resident as appropriate.  Ok Edwards NP [facility]  Contact [phone] Monday through Friday 8am- 5pm  After hours call [phone]

## 2019-08-07 DIAGNOSIS — D649 Anemia, unspecified: Secondary | ICD-10-CM | POA: Diagnosis not present

## 2019-08-07 DIAGNOSIS — Z1159 Encounter for screening for other viral diseases: Secondary | ICD-10-CM | POA: Diagnosis not present

## 2019-08-09 ENCOUNTER — Encounter: Payer: Self-pay | Admitting: Adult Health

## 2019-08-09 ENCOUNTER — Non-Acute Institutional Stay (SKILLED_NURSING_FACILITY): Payer: Medicare Other | Admitting: Adult Health

## 2019-08-09 DIAGNOSIS — L89610 Pressure ulcer of right heel, unstageable: Secondary | ICD-10-CM

## 2019-08-09 DIAGNOSIS — L89620 Pressure ulcer of left heel, unstageable: Secondary | ICD-10-CM | POA: Diagnosis not present

## 2019-08-09 DIAGNOSIS — R627 Adult failure to thrive: Secondary | ICD-10-CM | POA: Diagnosis not present

## 2019-08-09 DIAGNOSIS — N3281 Overactive bladder: Secondary | ICD-10-CM

## 2019-08-09 NOTE — Progress Notes (Signed)
Location:    [place] Room Number: 145/D Place of Service:  SNF (31)   CODE STATUS: DNR  Allergies  Allergen Reactions  . Cephalexin Other (See Comments)    Pt. Doesn't remember.   . Cephalexin Other (See Comments)    Pt. Doesn't remember.   . Penicillins Other (See Comments)    Pt. Cant remember.     Chief Complaint  Patient presents with  . Medical Management of Chronic Issues         Bilateral unstaged heel ulceration:  Failure to thrive in adult:   Urinary incontinence in female:    HPI:  She is a 86 year old long term resident of this facility being seen for the management of her chronic illnesses; hell ulcerations; ftt; ui. She continues to be followed by hospice care. There are no reports of uncontrolled pain; her weight is stable. There are no reports of agitation or anxiety ,  Past Medical History:  Diagnosis Date  . Thyroid disease     Past Surgical History:  Procedure Laterality Date  . ABDOMINAL HYSTERECTOMY    . TONSILLECTOMY    . TOTAL HIP ARTHROPLASTY     left    Social History   Socioeconomic History  . Marital status: Widowed    Spouse name: Not on file  . Number of children: Not on file  . Years of education: Not on file  . Highest education level: Not on file  Occupational History  . Not on file  Tobacco Use  . Smoking status: Never Smoker  . Smokeless tobacco: Never Used  Substance and Sexual Activity  . Alcohol use: No  . Drug use: No  . Sexual activity: Not Currently  Other Topics Concern  . Not on file  Social History Narrative   Her granddaughter and her family live across the street. Family is very involved and supportive.   Social Determinants of Health   Financial Resource Strain:   . Difficulty of Paying Living Expenses:   Food Insecurity:   . Worried About Charity fundraiser in the Last Year:   . Arboriculturist in the Last Year:   Transportation Needs:   . Film/video editor (Medical):     Marland Kitchen Lack of Transportation (Non-Medical):   Physical Activity:   . Days of Exercise per Week:   . Minutes of Exercise per Session:   Stress:   . Feeling of Stress :   Social Connections:   . Frequency of Communication with Friends and Family:   . Frequency of Social Gatherings with Friends and Family:   . Attends Religious Services:   . Active Member of Clubs or Organizations:   . Attends Archivist Meetings:   Marland Kitchen Marital Status:   Intimate Partner Violence:   . Fear of Current or Ex-Partner:   . Emotionally Abused:   Marland Kitchen Physically Abused:   . Sexually Abused:    Family History  Problem Relation Age of Onset  . Heart attack Mother   . Heart attack Father       VITAL SIGNS BP 102/70   Pulse 84   Temp (!) 96.8 F (36 C) (Oral)   Resp 20   Ht 5' 1"  (1.549 m)   Wt 135 lb (61.2 kg)   SpO2 90%   BMI 25.51 kg/m   Outpatient Encounter Medications as of 08/09/2019  Medication Sig  . acetaminophen (TYLENOL) 650 MG CR tablet Take 650 mg  by mouth every 6 (six) hours.  Roseanne Kaufman [employer] [facility]) OINT Special Instructions: Apply to sacrum and bilateral buttocks qshift for prevention. Every Shift Day, Evening, Night  . collagenase (SANTYL) ointment Apply 1 application topically daily. Special Instructions: Apply to wounds per tx order.   . diclofenac Sodium (VOLTAREN) 1 % GEL Apply 2 g topically every 6 (six) hours as needed. Apply topically to bilateral knees as needed for arthritic pain.  Marland Kitchen docusate sodium (COLACE) 100 MG capsule Take 100 mg by mouth 2 (two) times daily.  . feeding supplement, ENSURE ENLIVE, (ENSURE ENLIVE) LIQD Take 237 mLs by mouth with breakfast, with lunch, and with evening meal.   . levothyroxine (SYNTHROID) 100 MCG tablet Take 100 mcg by mouth daily before breakfast.  . NON FORMULARY Diet: _____ Regular,  ___x___ NAS,  _______Consistent Carbohydrate,  _______NPO  _____Other  . oxyCODONE (OXY IR/ROXICODONE) 5 MG immediate release tablet  Take 1 tablet (5 mg total) by mouth every 6 (six) hours as needed for severe pain.   No facility-administered encounter medications on file as of 08/09/2019.     SIGNIFICANT DIAGNOSTIC EXAMS   PREVIOUS  07-04-19: acute abdomen x-ray: No evidence of bowel obstruction or free air. Cardiomegaly  No acute cardiopulmonary disease.  NO NEW EXAMS.    LABS REVIEWED PREVIOUS  06-10-19: wbc 5.9; hgb 11.6; hct 37.2; mcv 102.8 plt 237 glucose 91; bun 50; creat 1.05 ;k+ 4.4; na++ 141; ca 9.1 liver normal albumin 3.5 07-04-19: wbc 10.4; hgb 11.5; hct 36.6; mcv 103.1 plt 214; glucose 190; bun 97; creat 2.41; k+ 5.3; na++ 141; ca 9.0 ast 42; alt 65; alk phos 158; albumin 3.2   07-04-19: vit B 12: 1014; iron 23;tibc 260 tsh 1.382 free T4: 1.66 urine culture: e-coli  07-15-19: glucose 102; bun 71; creat 1.45; k+ 4.7; na++ 139; ca 9.0   NO NEW LABS.   Review of Systems  Constitutional: Negative for malaise/fatigue.  Respiratory: Negative for cough and shortness of breath.   Cardiovascular: Negative for chest pain, palpitations and leg swelling.  Gastrointestinal: Negative for abdominal pain, constipation and heartburn.  Musculoskeletal: Negative for back pain, joint pain and myalgias.  Skin: Negative.   Neurological: Negative for dizziness.  Psychiatric/Behavioral: The patient is not nervous/anxious.     Physical Exam Constitutional:      General: She is not in acute distress.    Appearance: She is well-developed. She is not diaphoretic.  Neck:     Thyroid: No thyromegaly.  Cardiovascular:     Rate and Rhythm: Normal rate and regular rhythm.     Heart sounds: Normal heart sounds.     Comments: Unable to palpate pedal or post tib pulses  Pulmonary:     Effort: Pulmonary effort is normal. No respiratory distress.     Breath sounds: Normal breath sounds.  Abdominal:     General: Bowel sounds are normal. There is no distension.     Palpations: Abdomen is soft.     Tenderness: There is no  abdominal tenderness.  Musculoskeletal:     Cervical back: Neck supple.     Right lower leg: No edema.     Left lower leg: No edema.     Comments: Is able to move all extremities   Lymphadenopathy:     Cervical: No cervical adenopathy.  Skin:    General: Skin is warm and dry.     Comments: Left heel: 3 x 2.8 cm Right heel: 3.5 x 3.0 x 0.2 cm  Neurological:     Mental Status: She is alert. Mental status is at baseline.  Psychiatric:        Mood and Affect: Mood normal.           ASSESSMENT/ PLAN:  TODAY  1. Bilateral unstaged heel ulceration: is stable will continue current treatment and oxycodone 5 mg every 6 hours as needed for pain management.   2. Failure to thrive in adult: is present stable her weight is 135 pounds will continue supplements as indicated unable to tolerate remeron  3. Urinary incontinence in female: is stable is off medications will monitor   PREVIOUS  4. Acquired hypothyroidism: is stable tsh 1.382 will continue synthroid 100 mcg daily   5. Chronic constipation: is stable will continue colace twice daily   6. Hypokalemia: is stable k+ 4.3 will continue to monitor   7. CKD stage 3b: is without change bun 71; creat 1.45 will monitor   8. Advanced dementia: no changes in status; will not make changes will monitor         MD is aware of resident's narcotic use and is in agreement with current plan of care. We will attempt to wean resident as appropriate.  Ok Edwards NP [facility]  Contact [phone] Monday through Friday 8am- 5pm  After hours call [phone]

## 2019-08-21 ENCOUNTER — Other Ambulatory Visit: Payer: Self-pay | Admitting: Adult Health

## 2019-08-21 MED ORDER — OXYCODONE HCL 5 MG PO TABS: 5.0000 mg | ORAL_TABLET | Freq: Four times a day (QID) | ORAL | 0 refills | Status: DC | PRN

## 2019-08-23 ENCOUNTER — Non-Acute Institutional Stay (SKILLED_NURSING_FACILITY): Payer: Medicare Other | Admitting: Adult Health

## 2019-08-23 ENCOUNTER — Encounter: Payer: Self-pay | Admitting: Adult Health

## 2019-08-23 DIAGNOSIS — L89610 Pressure ulcer of right heel, unstageable: Secondary | ICD-10-CM

## 2019-08-23 DIAGNOSIS — L89620 Pressure ulcer of left heel, unstageable: Secondary | ICD-10-CM

## 2019-08-23 NOTE — Progress Notes (Signed)
Location:    [place] Room Number: 145/D Place of Service:  SNF (31)   CODE STATUS: DNR  Allergies  Allergen Reactions  . Cephalexin Other (See Comments)    Pt. Doesn't remember.   . Cephalexin Other (See Comments)    Pt. Doesn't remember.   . Penicillins Other (See Comments)    Pt. Cant remember.     Chief Complaint  Patient presents with  . Acute Visit    Wound Management    HPI:  Her right heel ulceration has worsened. It remains necrotic with inflamed wound edges with purulent drainage present.  There are no reports of fevers present   Past Medical History:  Diagnosis Date  . Thyroid disease     Past Surgical History:  Procedure Laterality Date  . ABDOMINAL HYSTERECTOMY    . TONSILLECTOMY    . TOTAL HIP ARTHROPLASTY     left    Social History   Socioeconomic History  . Marital status: Widowed    Spouse name: Not on file  . Number of children: Not on file  . Years of education: Not on file  . Highest education level: Not on file  Occupational History  . Not on file  Tobacco Use  . Smoking status: Never Smoker  . Smokeless tobacco: Never Used  Substance and Sexual Activity  . Alcohol use: No  . Drug use: No  . Sexual activity: Not Currently  Other Topics Concern  . Not on file  Social History Narrative   Her granddaughter and her family live across the street. Family is very involved and supportive.   Social Determinants of Health   Financial Resource Strain:   . Difficulty of Paying Living Expenses:   Food Insecurity:   . Worried About Charity fundraiser in the Last Year:   . Arboriculturist in the Last Year:   Transportation Needs:   . Film/video editor (Medical):   Marland Kitchen Lack of Transportation (Non-Medical):   Physical Activity:   . Days of Exercise per Week:   . Minutes of Exercise per Session:   Stress:   . Feeling of Stress :   Social Connections:   . Frequency of Communication with Friends and Family:     . Frequency of Social Gatherings with Friends and Family:   . Attends Religious Services:   . Active Member of Clubs or Organizations:   . Attends Archivist Meetings:   Marland Kitchen Marital Status:   Intimate Partner Violence:   . Fear of Current or Ex-Partner:   . Emotionally Abused:   Marland Kitchen Physically Abused:   . Sexually Abused:    Family History  Problem Relation Age of Onset  . Heart attack Mother   . Heart attack Father       VITAL SIGNS BP (!) 153/87   Pulse 74   Temp 99 F (37.2 C) (Oral)   Resp 20   Ht _0  (1.549 m)   Wt 132 lb (59.9 kg)   SpO2 90%   BMI 24.94 kg/m   Outpatient Encounter Medications as of 08/23/2019  Medication Sig  . acetaminophen (TYLENOL) 650 MG CR tablet Take 650 mg by mouth every 6 (six) hours.  Roseanne Kaufman [employer] [facility]) OINT Special Instructions: Apply to sacrum and bilateral buttocks qshift for prevention. Every Shift Day, Evening, Night  . collagenase (SANTYL) ointment Apply 1 application topically daily. Special Instructions: Apply to wounds per tx order.   Marland Kitchen  diclofenac Sodium (VOLTAREN) 1 % GEL Apply 2 g topically every 6 (six) hours as needed. Apply topically to bilateral knees as needed for arthritic pain.  Marland Kitchen docusate sodium (COLACE) 100 MG capsule Take 100 mg by mouth 2 (two) times daily.  Marland Kitchen doxycycline (VIBRAMYCIN) 100 MG capsule Take 100 mg by mouth 2 (two) times daily.  . feeding supplement, ENSURE ENLIVE, (ENSURE ENLIVE) LIQD Take 237 mLs by mouth with breakfast, with lunch, and with evening meal.   . levothyroxine (SYNTHROID) 100 MCG tablet Take 100 mcg by mouth daily before breakfast.  . NON FORMULARY Diet: _____ Regular,  ___x___ NAS,  _______Consistent Carbohydrate,  _______NPO  _____Other  . oxyCODONE (OXY IR/ROXICODONE) 5 MG immediate release tablet Take 1 tablet (5 mg total) by mouth every 6 (six) hours as needed for severe pain.   No facility-administered encounter medications on file as of 08/23/2019.      SIGNIFICANT DIAGNOSTIC EXAMS  PREVIOUS  07-04-19: acute abdomen x-ray: No evidence of bowel obstruction or free air. Cardiomegaly  No acute cardiopulmonary disease.  NO NEW EXAMS.    LABS REVIEWED PREVIOUS  06-10-19: wbc 5.9; hgb 11.6; hct 37.2; mcv 102.8 plt 237 glucose 91; bun 50; creat 1.05 ;k+ 4.4; na++ 141; ca 9.1 liver normal albumin 3.5 07-04-19: wbc 10.4; hgb 11.5; hct 36.6; mcv 103.1 plt 214; glucose 190; bun 97; creat 2.41; k+ 5.3; na++ 141; ca 9.0 ast 42; alt 65; alk phos 158; albumin 3.2   07-04-19: vit B 12: 1014; iron 23;tibc 260 tsh 1.382 free T4: 1.66 urine culture: e-coli  07-15-19: glucose 102; bun 71; creat 1.45; k+ 4.7; na++ 139; ca 9.0   NO NEW LABS.   Review of Systems  Constitutional: Negative for malaise/fatigue.  Respiratory: Negative for cough and shortness of breath.   Cardiovascular: Negative for chest pain, palpitations and leg swelling.  Gastrointestinal: Negative for abdominal pain, constipation and heartburn.  Musculoskeletal: Negative for back pain, joint pain and myalgias.  Skin:       Heel sores   Neurological: Negative for dizziness.  Psychiatric/Behavioral: The patient is not nervous/anxious.     Physical Exam Constitutional:      General: She is not in acute distress.    Appearance: She is well-developed. She is not diaphoretic.  Neck:     Thyroid: No thyromegaly.  Cardiovascular:     Rate and Rhythm: Normal rate and regular rhythm.     Heart sounds: Normal heart sounds.     Comments: Unable to palpate pedal or post tib pulses  Pulmonary:     Effort: Pulmonary effort is normal. No respiratory distress.     Breath sounds: Normal breath sounds.  Abdominal:     General: Bowel sounds are normal. There is no distension.     Palpations: Abdomen is soft.     Tenderness: There is no abdominal tenderness.  Musculoskeletal:     Cervical back: Neck supple.     Right lower leg: No edema.     Left lower leg: No edema.     Comments: Is able  to move all extremities    Lymphadenopathy:     Cervical: No cervical adenopathy.  Skin:    General: Skin is warm and dry.     Comments: Left heel: 3 x 3 cm Right heel: 4 x 4.5 cm necrotic         Neurological:     Mental Status: She is alert. Mental status is at baseline.  Psychiatric:  Mood and Affect: Mood normal.       ASSESSMENT/ PLAN:  TODAY  1. Pressure ulcer heels bilateral unstagable  Right heel is worse: will get x-ray of right heel; will begin doxycycline 100 mg twice daily through 09-06-19      MD is aware of resident's narcotic use and is in agreement with current plan of care. We will attempt to wean resident as appropriate.  Ok Edwards NP [facility] [place]  Contact [phone] Monday through Friday 8am- 5pm  After hours call [phone]

## 2019-08-27 ENCOUNTER — Other Ambulatory Visit: Payer: Self-pay | Admitting: Adult Health

## 2019-08-27 ENCOUNTER — Non-Acute Institutional Stay (SKILLED_NURSING_FACILITY): Payer: Medicare Other | Admitting: Adult Health

## 2019-08-27 ENCOUNTER — Encounter: Payer: Self-pay | Admitting: Adult Health

## 2019-08-27 DIAGNOSIS — L89610 Pressure ulcer of right heel, unstageable: Secondary | ICD-10-CM

## 2019-08-27 DIAGNOSIS — L89620 Pressure ulcer of left heel, unstageable: Secondary | ICD-10-CM | POA: Diagnosis not present

## 2019-08-27 MED ORDER — OXYCODONE HCL 5 MG PO TABS: 5.0000 mg | ORAL_TABLET | Freq: Four times a day (QID) | ORAL | 0 refills | Status: DC

## 2019-08-27 NOTE — Progress Notes (Signed)
Location:    [place] Room Number: 145/D Place of Service:  SNF (31)   CODE STATUS: DNR  Allergies  Allergen Reactions  . Cephalexin Other (See Comments)    Pt. Doesn't remember.   . Cephalexin Other (See Comments)    Pt. Doesn't remember.   . Penicillins Other (See Comments)    Pt. Cant remember.     Chief Complaint  Patient presents with  . Acute Visit    Pain    HPI:  She has right heel pain and an unstaged right heel ulceration. She is presently on abt for the wound. She has oxycodone 5 mg every 6 hours as needed which she has been using about 2 times daily. She is presently not getting adequate pain relief. The pain is interfering with her quality of life. She continues to be followed by hospice care.   Past Medical History:  Diagnosis Date  . Thyroid disease     Past Surgical History:  Procedure Laterality Date  . ABDOMINAL HYSTERECTOMY    . TONSILLECTOMY    . TOTAL HIP ARTHROPLASTY     left    Social History   Socioeconomic History  . Marital status: Widowed    Spouse name: Not on file  . Number of children: Not on file  . Years of education: Not on file  . Highest education level: Not on file  Occupational History  . Not on file  Tobacco Use  . Smoking status: Never Smoker  . Smokeless tobacco: Never Used  Substance and Sexual Activity  . Alcohol use: No  . Drug use: No  . Sexual activity: Not Currently  Other Topics Concern  . Not on file  Social History Narrative   Her granddaughter and her family live across the street. Family is very involved and supportive.   Social Determinants of Health   Financial Resource Strain:   . Difficulty of Paying Living Expenses:   Food Insecurity:   . Worried About Charity fundraiser in the Last Year:   . Arboriculturist in the Last Year:   Transportation Needs:   . Film/video editor (Medical):   Marland Kitchen Lack of Transportation (Non-Medical):   Physical Activity:   . Days of  Exercise per Week:   . Minutes of Exercise per Session:   Stress:   . Feeling of Stress :   Social Connections:   . Frequency of Communication with Friends and Family:   . Frequency of Social Gatherings with Friends and Family:   . Attends Religious Services:   . Active Member of Clubs or Organizations:   . Attends Archivist Meetings:   Marland Kitchen Marital Status:   Intimate Partner Violence:   . Fear of Current or Ex-Partner:   . Emotionally Abused:   Marland Kitchen Physically Abused:   . Sexually Abused:    Family History  Problem Relation Age of Onset  . Heart attack Mother   . Heart attack Father       VITAL SIGNS BP 116/74   Pulse 88   Temp 98.4 F (36.9 C) (Oral)   Resp 20   Ht 5' 1"  (1.549 m)   Wt 132 lb (59.9 kg)   SpO2 90%   BMI 24.94 kg/m   Outpatient Encounter Medications as of 08/27/2019  Medication Sig  . acetaminophen (TYLENOL) 650 MG CR tablet Take 650 mg by mouth every 6 (six) hours.  Roseanne Kaufman [place]-Castor Oil ([employer]) OINT Special  Instructions: Apply to sacrum and bilateral buttocks qshift for prevention. Every Shift Day, Evening, Night  . collagenase (SANTYL) ointment Apply 1 application topically daily. Special Instructions: Apply to wounds per tx order.   . diclofenac Sodium (VOLTAREN) 1 % GEL Apply 2 g topically every 6 (six) hours as needed. Apply topically to bilateral knees as needed for arthritic pain.  Marland Kitchen docusate sodium (COLACE) 100 MG capsule Take 100 mg by mouth 2 (two) times daily.  . feeding supplement, ENSURE ENLIVE, (ENSURE ENLIVE) LIQD Take 237 mLs by mouth with breakfast, with lunch, and with evening meal.   . levothyroxine (SYNTHROID) 100 MCG tablet Take 100 mcg by mouth daily before breakfast.  . NON FORMULARY Diet: _____ Regular,  ___x___ NAS,  _______Consistent Carbohydrate,  _______NPO  _____Other  . oxyCODONE (OXY IR/ROXICODONE) 5 MG immediate release tablet Take 1 tablet (5 mg total) by mouth every 6 (six) hours.  Marland Kitchen  sulfamethoxazole-trimethoprim (BACTRIM DS) 800-160 MG tablet Take 1 tablet by mouth 2 (two) times daily. for right heel cellulitis x 7 days  . [DISCONTINUED] doxycycline (VIBRAMYCIN) 100 MG capsule Take 100 mg by mouth 2 (two) times daily.   No facility-administered encounter medications on file as of 08/27/2019.     SIGNIFICANT DIAGNOSTIC EXAMS   PREVIOUS  07-04-19: acute abdomen x-ray: No evidence of bowel obstruction or free air. Cardiomegaly  No acute cardiopulmonary disease.  NO NEW EXAMS.    LABS REVIEWED PREVIOUS  06-10-19: wbc 5.9; hgb 11.6; hct 37.2; mcv 102.8 plt 237 glucose 91; bun 50; creat 1.05 ;k+ 4.4; na++ 141; ca 9.1 liver normal albumin 3.5 07-04-19: wbc 10.4; hgb 11.5; hct 36.6; mcv 103.1 plt 214; glucose 190; bun 97; creat 2.41; k+ 5.3; na++ 141; ca 9.0 ast 42; alt 65; alk phos 158; albumin 3.2   07-04-19: vit B 12: 1014; iron 23;tibc 260 tsh 1.382 free T4: 1.66 urine culture: e-coli  07-15-19: glucose 102; bun 71; creat 1.45; k+ 4.7; na++ 139; ca 9.0   NO NEW LABS.   Review of Systems  Constitutional: Negative for malaise/fatigue.  Respiratory: Negative for cough and shortness of breath.   Cardiovascular: Negative for chest pain, palpitations and leg swelling.  Gastrointestinal: Negative for abdominal pain, constipation and heartburn.  Musculoskeletal: Negative for back pain, joint pain and myalgias.       Right heel pain   Skin:       Heel sores   Neurological: Negative for dizziness.  Psychiatric/Behavioral: The patient is not nervous/anxious.     Physical Exam Constitutional:      General: She is not in acute distress.    Appearance: She is well-developed. She is not diaphoretic.  Neck:     Thyroid: No thyromegaly.  Cardiovascular:     Rate and Rhythm: Normal rate and regular rhythm.     Heart sounds: Normal heart sounds.     Comments: Unable to palpate pedal or post tib pulses  Pulmonary:     Effort: Pulmonary effort is normal. No respiratory  distress.     Breath sounds: Normal breath sounds.  Abdominal:     General: Bowel sounds are normal. There is no distension.     Palpations: Abdomen is soft.     Tenderness: There is no abdominal tenderness.  Musculoskeletal:     Cervical back: Neck supple.     Right lower leg: No edema.     Left lower leg: No edema.     Comments: Is able to move all extremities  Lymphadenopathy:     Cervical: No cervical adenopathy.  Skin:    General: Skin is warm and dry.     Comments:  Left heel: 3 x 3 cm Right heel: 4 x 4.5 cm necrotic          Neurological:     Mental Status: She is alert. Mental status is at baseline.  Psychiatric:        Mood and Affect: Mood normal.      ASSESSMENT/ PLAN:  TODAY  1. Pressure ulcer of both heels unstagable  Will change to oxycodone 5 mg every 6 hours routinely and will monitor her status.     MD is aware of resident's narcotic use and is in agreement with current plan of care. We will attempt to wean resident as appropriate.  Ok Edwards NP [facility]  Contact [phone] Monday through Friday 8am- 5pm  After hours call [phone]

## 2019-09-03 DIAGNOSIS — R69 Illness, unspecified: Secondary | ICD-10-CM | POA: Diagnosis not present

## 2019-09-05 ENCOUNTER — Encounter: Payer: Self-pay | Admitting: Adult Health

## 2019-09-05 ENCOUNTER — Non-Acute Institutional Stay (SKILLED_NURSING_FACILITY): Payer: Medicare Other | Admitting: Adult Health

## 2019-09-05 DIAGNOSIS — K5909 Other constipation: Secondary | ICD-10-CM | POA: Diagnosis not present

## 2019-09-05 DIAGNOSIS — E876 Hypokalemia: Secondary | ICD-10-CM

## 2019-09-05 DIAGNOSIS — E039 Hypothyroidism, unspecified: Secondary | ICD-10-CM | POA: Diagnosis not present

## 2019-09-05 NOTE — Progress Notes (Signed)
Location:    [place] Room Number: 145/W Place of Service:  SNF (31)   CODE STATUS: DNR  Allergies  Allergen Reactions  . Cephalexin Other (See Comments)    Pt. Doesn't remember.   . Cephalexin Other (See Comments)    Pt. Doesn't remember.   . Penicillins Other (See Comments)    Pt. Cant remember.     Chief Complaint  Patient presents with  . Medical Management of Chronic Issues         Acquired hypothyroidism:    Chronic constipation:  Hypokalemia:    HPI:  She is a 86 year old long term resident of this facility being seen for the management of his chronic illnesses: hypothyroidism; constipation; hypokalemia. There are no reports of uncontrolled pain; no reports of excessive fatigue; no constipation.   Past Medical History:  Diagnosis Date  . Thyroid disease     Past Surgical History:  Procedure Laterality Date  . ABDOMINAL HYSTERECTOMY    . TONSILLECTOMY    . TOTAL HIP ARTHROPLASTY     left    Social History   Socioeconomic History  . Marital status: Widowed    Spouse name: Not on file  . Number of children: Not on file  . Years of education: Not on file  . Highest education level: Not on file  Occupational History  . Not on file  Tobacco Use  . Smoking status: Never Smoker  . Smokeless tobacco: Never Used  Substance and Sexual Activity  . Alcohol use: No  . Drug use: No  . Sexual activity: Not Currently  Other Topics Concern  . Not on file  Social History Narrative   Her granddaughter and her family live across the street. Family is very involved and supportive.   Social Determinants of Health   Financial Resource Strain:   . Difficulty of Paying Living Expenses:   Food Insecurity:   . Worried About Charity fundraiser in the Last Year:   . Arboriculturist in the Last Year:   Transportation Needs:   . Film/video editor (Medical):   Marland Kitchen Lack of Transportation (Non-Medical):   Physical Activity:   . Days of  Exercise per Week:   . Minutes of Exercise per Session:   Stress:   . Feeling of Stress :   Social Connections:   . Frequency of Communication with Friends and Family:   . Frequency of Social Gatherings with Friends and Family:   . Attends Religious Services:   . Active Member of Clubs or Organizations:   . Attends Archivist Meetings:   Marland Kitchen Marital Status:   Intimate Partner Violence:   . Fear of Current or Ex-Partner:   . Emotionally Abused:   Marland Kitchen Physically Abused:   . Sexually Abused:    Family History  Problem Relation Age of Onset  . Heart attack Mother   . Heart attack Father       VITAL SIGNS BP 124/62   Pulse 73   Temp (!) 97.5 F (36.4 C) (Oral)   Resp 20   Ht 5' 1"  (1.549 m)   Wt 132 lb (59.9 kg)   SpO2 90%   BMI 24.94 kg/m   Outpatient Encounter Medications as of 09/05/2019  Medication Sig  . acetaminophen (TYLENOL) 650 MG CR tablet Take 650 mg by mouth every 6 (six) hours.  Roseanne Kaufman [place] [facility]) OINT Special Instructions: Apply to sacrum and bilateral buttocks qshift  for prevention. Every Shift Day, Evening, Night  . collagenase (SANTYL) ointment Apply 1 application topically daily. Special Instructions: Apply to right heel wound per tx order daily.  . diclofenac Sodium (VOLTAREN) 1 % GEL Apply 2 g topically every 6 (six) hours as needed. Apply topically to bilateral knees as needed for arthritic pain.  Marland Kitchen docusate sodium (COLACE) 100 MG capsule Take 100 mg by mouth 2 (two) times daily.  . feeding supplement, ENSURE ENLIVE, (ENSURE ENLIVE) LIQD Take 237 mLs by mouth with breakfast, with lunch, and with evening meal.   . levothyroxine (SYNTHROID) 100 MCG tablet Take 100 mcg by mouth daily before breakfast.  . NON FORMULARY Diet: _____ Regular,  ___x___ NAS,  _______Consistent Carbohydrate,  _______NPO  _____Other  . oxyCODONE (OXY IR/ROXICODONE) 5 MG immediate release tablet Take 1 tablet (5 mg total) by mouth every 6 (six) hours.    No facility-administered encounter medications on file as of 09/05/2019.     SIGNIFICANT DIAGNOSTIC EXAMS  PREVIOUS  07-04-19: acute abdomen x-ray: No evidence of bowel obstruction or free air. Cardiomegaly  No acute cardiopulmonary disease.  TODAY  08-23-19: chest x-ray: No acute bone abnormality. No osteomyelitis is noted.   LABS REVIEWED PREVIOUS  06-10-19: wbc 5.9; hgb 11.6; hct 37.2; mcv 102.8 plt 237 glucose 91; bun 50; creat 1.05 ;k+ 4.4; na++ 141; ca 9.1 liver normal albumin 3.5 07-04-19: wbc 10.4; hgb 11.5; hct 36.6; mcv 103.1 plt 214; glucose 190; bun 97; creat 2.41; k+ 5.3; na++ 141; ca 9.0 ast 42; alt 65; alk phos 158; albumin 3.2   07-04-19: vit B 12: 1014; iron 23;tibc 260 tsh 1.382 free T4: 1.66 urine culture: e-coli  07-15-19: glucose 102; bun 71; creat 1.45; k+ 4.7; na++ 139; ca 9.0   NO NEW LABS.   Review of Systems  Constitutional: Negative for malaise/fatigue.  Respiratory: Negative for cough and shortness of breath.   Cardiovascular: Negative for chest pain, palpitations and leg swelling.  Gastrointestinal: Negative for abdominal pain, constipation and heartburn.  Musculoskeletal: Negative for back pain, joint pain and myalgias.  Skin: Negative.   Neurological: Negative for dizziness.  Psychiatric/Behavioral: The patient is not nervous/anxious.      Physical Exam Constitutional:      General: She is not in acute distress.    Appearance: She is well-developed. She is not diaphoretic.  Neck:     Thyroid: No thyromegaly.  Cardiovascular:     Rate and Rhythm: Normal rate and regular rhythm.     Heart sounds: Normal heart sounds.     Comments: Unable to palpate pedal or post tib pulses  Pulmonary:     Effort: Pulmonary effort is normal. No respiratory distress.     Breath sounds: Normal breath sounds.  Abdominal:     General: Bowel sounds are normal. There is no distension.     Palpations: Abdomen is soft.     Tenderness: There is no abdominal tenderness.   Musculoskeletal:        General: Normal range of motion.     Cervical back: Neck supple.     Right lower leg: No edema.     Left lower leg: No edema.  Lymphadenopathy:     Cervical: No cervical adenopathy.  Skin:    General: Skin is warm and dry.     Comments: Left heel stage 2: 2 x 2 cm Right heel: unstaged: 4 x 4.2 x 0.1 cm  Neurological:     Mental Status: She is alert. Mental status  is at baseline.  Psychiatric:        Mood and Affect: Mood normal.         ASSESSMENT/ PLAN:  TODAY  1. Acquired hypothyroidism: is stable tsh 1.382 will continue synthroid 100 mcg daily   2. Chronic constipation: is stable will continue colace twice daily   3. Hypokalemia: is stable k+ 4.3 will monitor   PREVIOUS  4. CKD stage 3b: is without change bun 71; creat 1.45 will monitor   5. Advanced dementia: no changes in status; will not make changes will monitor   6. Bilateral unstaged heel ulceration: is stable will continue current treatment and oxycodone 5 mg every 6 hours  for pain management.   7. Failure to thrive in adult: is present stable her weight is 132 pounds will continue supplements as indicated unable to tolerate remeron  8. Urinary incontinence in female: is stable is off medications will monitor     MD is aware of resident's narcotic use and is in agreement with current plan of care. We will attempt to wean resident as appropriate.  Ok Edwards NP [facility]  Contact [phone] Monday through Friday 8am- 5pm  After hours call [phone]

## 2019-09-16 DIAGNOSIS — R69 Illness, unspecified: Secondary | ICD-10-CM | POA: Diagnosis not present

## 2019-09-17 ENCOUNTER — Other Ambulatory Visit: Payer: Self-pay | Admitting: Adult Health

## 2019-09-17 MED ORDER — OXYCODONE HCL 5 MG PO TABS: 5.0000 mg | ORAL_TABLET | Freq: Four times a day (QID) | ORAL | 0 refills | Status: DC

## 2019-09-25 ENCOUNTER — Non-Acute Institutional Stay: Payer: Self-pay | Admitting: Adult Health

## 2019-09-25 ENCOUNTER — Encounter: Payer: Self-pay | Admitting: Adult Health

## 2019-09-25 ENCOUNTER — Other Ambulatory Visit: Payer: Self-pay | Admitting: Adult Health

## 2019-09-25 DIAGNOSIS — G8929 Other chronic pain: Secondary | ICD-10-CM

## 2019-09-25 DIAGNOSIS — R52 Pain, unspecified: Secondary | ICD-10-CM

## 2019-09-25 MED ORDER — OXYCODONE HCL 5 MG PO TABS: 5.0000 mg | ORAL_TABLET | Freq: Three times a day (TID) | ORAL | 0 refills | Status: DC

## 2019-09-25 NOTE — Progress Notes (Signed)
Location:    [place] Room Number: 145/W Place of Service:  SNF (31)   CODE STATUS: DNR  Allergies  Allergen Reactions  . Cephalexin Other (See Comments)    Pt. Doesn't remember.   . Cephalexin Other (See Comments)    Pt. Doesn't remember.   . Penicillins Other (See Comments)    Pt. Cant remember.     Chief Complaint  Patient presents with  . Acute Visit    Medication Review    HPI:  She is presently taking tylenol cr every 6 hours voltaren gel 2 gm to both knees every 6 hours as needed; and oxycodone 5 mg every 6 hours. Her pain is managed. There are no reports of any pain present. No insomnia; no changes in appetite. She continues to be followed by hospice care. She is due for a gradual dose reduction.   Past Medical History:  Diagnosis Date  . Thyroid disease     Past Surgical History:  Procedure Laterality Date  . ABDOMINAL HYSTERECTOMY    . TONSILLECTOMY    . TOTAL HIP ARTHROPLASTY     left    Social History   Socioeconomic History  . Marital status: Widowed    Spouse name: Not on file  . Number of children: Not on file  . Years of education: Not on file  . Highest education level: Not on file  Occupational History  . Not on file  Tobacco Use  . Smoking status: Never Smoker  . Smokeless tobacco: Never Used  Substance and Sexual Activity  . Alcohol use: No  . Drug use: No  . Sexual activity: Not Currently  Other Topics Concern  . Not on file  Social History Narrative   Her granddaughter and her family live across the street. Family is very involved and supportive.   Social Determinants of Health   Financial Resource Strain:   . Difficulty of Paying Living Expenses:   Food Insecurity:   . Worried About Charity fundraiser in the Last Year:   . Arboriculturist in the Last Year:   Transportation Needs:   . Film/video editor (Medical):   Marland Kitchen Lack of Transportation (Non-Medical):   Physical Activity:   . Days of  Exercise per Week:   . Minutes of Exercise per Session:   Stress:   . Feeling of Stress :   Social Connections:   . Frequency of Communication with Friends and Family:   . Frequency of Social Gatherings with Friends and Family:   . Attends Religious Services:   . Active Member of Clubs or Organizations:   . Attends Archivist Meetings:   Marland Kitchen Marital Status:   Intimate Partner Violence:   . Fear of Current or Ex-Partner:   . Emotionally Abused:   Marland Kitchen Physically Abused:   . Sexually Abused:    Family History  Problem Relation Age of Onset  . Heart attack Mother   . Heart attack Father       VITAL SIGNS BP 102/68   Pulse 61   Temp 98.4 F (36.9 C) (Oral)   Resp 20   Ht _0  (1.549 m)   Wt 128 lb 6.4 oz (58.2 kg)   BMI 24.26 kg/m   Outpatient Encounter Medications as of 09/25/2019  Medication Sig  . acetaminophen (TYLENOL) 650 MG CR tablet Take 650 mg by mouth every 6 (six) hours.  Roseanne Kaufman [place]-Cast[employer] [facility]) OINT Special Instructions: Apply  to sacrum and bilateral buttocks qshift for prevention. Every Shift Day, Evening, Night  . collagenase (SANTYL) ointment Apply 1 application topically daily. Special Instructions: Apply to right heel wound per tx order daily.  . diclofenac Sodium (VOLTAREN) 1 % GEL Apply 2 g topically every 6 (six) hours as needed. Apply topically to bilateral knees as needed for arthritic pain.  Marland Kitchen docusate sodium (COLACE) 100 MG capsule Take 100 mg by mouth 2 (two) times daily.  . feeding supplement, ENSURE ENLIVE, (ENSURE ENLIVE) LIQD Take 237 mLs by mouth with breakfast, with lunch, and with evening meal.   . levothyroxine (SYNTHROID) 100 MCG tablet Take 100 mcg by mouth daily before breakfast.  . NON FORMULARY Diet: _____ Regular,  ___x___ NAS,  _______Consistent Carbohydrate,  _______NPO  _____Other  . oxyCODONE (OXY IR/ROXICODONE) 5 MG immediate release tablet Take 1 tablet (5 mg total) by mouth every 8 (eight) hours.   No  facility-administered encounter medications on file as of 09/25/2019.     SIGNIFICANT DIAGNOSTIC EXAMS   PREVIOUS  07-04-19: acute abdomen x-ray: No evidence of bowel obstruction or free air. Cardiomegaly  No acute cardiopulmonary disease.  08-23-19: chest x-ray: No acute bone abnormality. No osteomyelitis is noted.  NO NEW EXAMS.    LABS REVIEWED PREVIOUS  06-10-19: wbc 5.9; hgb 11.6; hct 37.2; mcv 102.8 plt 237 glucose 91; bun 50; creat 1.05 ;k+ 4.4; na++ 141; ca 9.1 liver normal albumin 3.5 07-04-19: wbc 10.4; hgb 11.5; hct 36.6; mcv 103.1 plt 214; glucose 190; bun 97; creat 2.41; k+ 5.3; na++ 141; ca 9.0 ast 42; alt 65; alk phos 158; albumin 3.2   07-04-19: vit B 12: 1014; iron 23;tibc 260 tsh 1.382 free T4: 1.66 urine culture: e-coli  07-15-19: glucose 102; bun 71; creat 1.45; k+ 4.7; na++ 139; ca 9.0   NO NEW LABS.   Review of Systems  Constitutional: Negative for malaise/fatigue.  Respiratory: Negative for cough and shortness of breath.   Cardiovascular: Negative for chest pain, palpitations and leg swelling.  Gastrointestinal: Negative for abdominal pain, constipation and heartburn.  Musculoskeletal: Negative for back pain, joint pain and myalgias.  Skin: Negative.   Neurological: Negative for dizziness.  Psychiatric/Behavioral: The patient is not nervous/anxious.     Physical Exam Constitutional:      General: She is not in acute distress.    Appearance: She is well-developed. She is not diaphoretic.  Neck:     Thyroid: No thyromegaly.  Cardiovascular:     Rate and Rhythm: Normal rate and regular rhythm.     Heart sounds: Normal heart sounds.     Comments: Unable to palpate pedal or post tib pulses  Pulmonary:     Effort: Pulmonary effort is normal. No respiratory distress.     Breath sounds: Normal breath sounds.  Abdominal:     General: Bowel sounds are normal. There is no distension.     Palpations: Abdomen is soft.     Tenderness: There is no abdominal  tenderness.  Musculoskeletal:        General: Normal range of motion.     Cervical back: Neck supple.     Right lower leg: No edema.     Left lower leg: No edema.  Lymphadenopathy:     Cervical: No cervical adenopathy.  Skin:    General: Skin is warm and dry.     Comments: Left heel stage 2:  1.2 x 0.5 cm Right heel: unstaged: 3.6 x 3.8 x 0.1 cm  Neurological:  Mental Status: She is alert. Mental status is at baseline.  Psychiatric:        Mood and Affect: Mood normal.       ASSESSMENT/ PLAN:  TODAY  1. Chronic generalized pain:   Is presently stable  Will continue voltaren gel 2 gm every 6 hours as needed to knees Will continue tylenol cr 650 mg every 6 hours Will lower oxycodone 5 mg every 8 hours for pain management  Will monitor   MD is aware of resident's narcotic use and is in agreement with current plan of care. We will attempt to wean resident as appropriate.  Ok Edwards NP [facility]  Contact [phone] Monday through Friday 8am- 5pm  After hours call [phone]

## 2019-09-30 DIAGNOSIS — G8929 Other chronic pain: Secondary | ICD-10-CM | POA: Insufficient documentation

## 2019-10-07 ENCOUNTER — Other Ambulatory Visit: Payer: Self-pay | Admitting: Adult Health

## 2019-10-07 DIAGNOSIS — R69 Illness, unspecified: Secondary | ICD-10-CM | POA: Diagnosis not present

## 2019-10-08 ENCOUNTER — Encounter: Payer: Self-pay | Admitting: Adult Health

## 2019-10-08 ENCOUNTER — Non-Acute Institutional Stay (SKILLED_NURSING_FACILITY): Payer: Medicare Other | Admitting: Adult Health

## 2019-10-08 DIAGNOSIS — R52 Pain, unspecified: Secondary | ICD-10-CM | POA: Diagnosis not present

## 2019-10-08 DIAGNOSIS — N1832 Chronic kidney disease, stage 3b: Secondary | ICD-10-CM | POA: Diagnosis not present

## 2019-10-08 DIAGNOSIS — F0390 Unspecified dementia without behavioral disturbance: Secondary | ICD-10-CM

## 2019-10-08 DIAGNOSIS — G8929 Other chronic pain: Secondary | ICD-10-CM | POA: Diagnosis not present

## 2019-10-08 DIAGNOSIS — F03C0 Unspecified dementia, severe, without behavioral disturbance, psychotic disturbance, mood disturbance, and anxiety: Secondary | ICD-10-CM

## 2019-10-08 NOTE — Progress Notes (Signed)
Location:    [facility] Room Number: 145/W Place of Service:  SNF (31)   CODE STATUS: DNR  Allergies  Allergen Reactions  . Cephalexin Other (See Comments)    Pt. Doesn't remember.   . Cephalexin Other (See Comments)    Pt. Doesn't remember.   . Penicillins Other (See Comments)    Pt. Cant remember.     Chief Complaint  Patient presents with  . Medical Management of Chronic Issues          CKD stage 3b:   Advanced dementia:   Chronic generalized pain    HPI:  She is a 86 year old long term resident of this facility being seen for the management of her chronic illnesses: ckd; dementia chronic pain. There are no reports of uncontrolled pain; no changes in appetite; weight without significant change. No reports of agitation or anxiety thoughts.   Past Medical History:  Diagnosis Date  . Thyroid disease     Past Surgical History:  Procedure Laterality Date  . ABDOMINAL HYSTERECTOMY    . TONSILLECTOMY    . TOTAL HIP ARTHROPLASTY     left    Social History   Socioeconomic History  . Marital status: Widowed    Spouse name: Not on file  . Number of children: Not on file  . Years of education: Not on file  . Highest education level: Not on file  Occupational History  . Not on file  Tobacco Use  . Smoking status: Never Smoker  . Smokeless tobacco: Never Used  Substance and Sexual Activity  . Alcohol use: No  . Drug use: No  . Sexual activity: Not Currently  Other Topics Concern  . Not on file  Social History Narrative   Her granddaughter and her family live across the street. Family is very involved and supportive.   Social Determinants of Health   Financial Resource Strain:   . Difficulty of Paying Living Expenses:   Food Insecurity:   . Worried About Charity fundraiser in the Last Year:   . Arboriculturist in the Last Year:   Transportation Needs:   . Film/video editor (Medical):   Marland Kitchen Lack of Transportation (Non-Medical):     Physical Activity:   . Days of Exercise per Week:   . Minutes of Exercise per Session:   Stress:   . Feeling of Stress :   Social Connections:   . Frequency of Communication with Friends and Family:   . Frequency of Social Gatherings with Friends and Family:   . Attends Religious Services:   . Active Member of Clubs or Organizations:   . Attends Archivist Meetings:   Marland Kitchen Marital Status:   Intimate Partner Violence:   . Fear of Current or Ex-Partner:   . Emotionally Abused:   Marland Kitchen Physically Abused:   . Sexually Abused:    Family History  Problem Relation Age of Onset  . Heart attack Mother   . Heart attack Father       VITAL SIGNS BP 130/75   Pulse 76   Temp 98.6 F (37 C) (Oral)   Resp 20   Ht 5' 1"  (1.549 m)   Wt 128 lb 6.4 oz (58.2 kg)   BMI 24.26 kg/m   Outpatient Encounter Medications as of 10/08/2019  Medication Sig  . acetaminophen (TYLENOL) 650 MG CR tablet Take 650 mg by mouth every 6 (six) hours.  Roseanne Kaufman Peru-Castor Oil (  VENELEX) OINT Special Instructions: Apply to sacrum and bilateral buttocks qshift for prevention. Every Shift Day, Evening, Night  . collagenase (SANTYL) ointment Apply 1 application topically daily. Special Instructions: Apply to right heel wound per tx order daily.  . diclofenac Sodium (VOLTAREN) 1 % GEL Apply 2 g topically every 6 (six) hours as needed. Apply topically to bilateral knees as needed for arthritic pain.  Marland Kitchen docusate sodium (COLACE) 100 MG capsule Take 100 mg by mouth 2 (two) times daily.  . feeding supplement, ENSURE ENLIVE, (ENSURE ENLIVE) LIQD Take 237 mLs by mouth with breakfast, with lunch, and with evening meal.   . levothyroxine (SYNTHROID) 100 MCG tablet Take 100 mcg by mouth daily before breakfast.  . NON FORMULARY Diet: _____ Regular,  ___x___ NAS,  _______Consistent Carbohydrate,  _______NPO  _____Other  . oxyCODONE (OXY IR/ROXICODONE) 5 MG immediate release tablet Take 1 tablet (5 mg total) by mouth  every 8 (eight) hours.   No facility-administered encounter medications on file as of 10/08/2019.     SIGNIFICANT DIAGNOSTIC EXAMS   PREVIOUS  07-04-19: acute abdomen x-ray: No evidence of bowel obstruction or free air. Cardiomegaly  No acute cardiopulmonary disease.  TODAY  08-23-19: chest x-ray: No acute bone abnormality. No osteomyelitis is noted.   LABS REVIEWED PREVIOUS  06-10-19: wbc 5.9; hgb 11.6; hct 37.2; mcv 102.8 plt 237 glucose 91; bun 50; creat 1.05 ;k+ 4.4; na++ 141; ca 9.1 liver normal albumin 3.5 07-04-19: wbc 10.4; hgb 11.5; hct 36.6; mcv 103.1 plt 214; glucose 190; bun 97; creat 2.41; k+ 5.3; na++ 141; ca 9.0 ast 42; alt 65; alk phos 158; albumin 3.2   07-04-19: vit B 12: 1014; iron 23;tibc 260 tsh 1.382 free T4: 1.66 urine culture: e-coli  07-15-19: glucose 102; bun 71; creat 1.45; k+ 4.7; na++ 139; ca 9.0   NO NEW LABS.   Review of Systems  Constitutional: Negative for malaise/fatigue.  Respiratory: Negative for cough and shortness of breath.   Cardiovascular: Negative for chest pain, palpitations and leg swelling.  Gastrointestinal: Negative for abdominal pain, constipation and heartburn.  Musculoskeletal: Negative for back pain, joint pain and myalgias.  Skin: Negative.   Neurological: Negative for dizziness.  Psychiatric/Behavioral: The patient is not nervous/anxious.       Physical Exam Constitutional:      General: She is not in acute distress.    Appearance: She is well-developed. She is not diaphoretic.  Neck:     Thyroid: No thyromegaly.  Cardiovascular:     Rate and Rhythm: Normal rate and regular rhythm.     Heart sounds: Normal heart sounds.     Comments: Unable to palpate pedal or post tib pulses  Pulmonary:     Effort: Pulmonary effort is normal. No respiratory distress.     Breath sounds: Normal breath sounds.  Abdominal:     General: Bowel sounds are normal. There is no distension.     Palpations: Abdomen is soft.     Tenderness: There  is no abdominal tenderness.  Musculoskeletal:        General: Normal range of motion.     Cervical back: Neck supple.     Right lower leg: No edema.     Left lower leg: No edema.     Comments: Left heel: 0.8 x 0.8 cm   Lymphadenopathy:     Cervical: No cervical adenopathy.  Skin:    General: Skin is warm and dry.  Neurological:     Mental Status: She is  alert. Mental status is at baseline.  Psychiatric:        Mood and Affect: Mood normal.         ASSESSMENT/ PLAN:  TODAY  1. CKD stage 3b: is without change bun 71; creat 1.45 will monitor   2. Advanced dementia: is stable weight is 128 pounds will monitor   3. Chronic generalized pain: is stable will continue oxycodone 5 mg every 8 hours.    PREVIOUS  4. Failure to thrive in adult: is present stable her weight is 128 pounds will continue supplements as indicated unable to tolerate remeron  5. Urinary incontinence in female: is stable is off medications will monitor   6. Acquired hypothyroidism: is stable tsh 1.382 will continue synthroid 100 mcg daily   7. Chronic constipation: is stable will continue colace twice daily   8. Hypokalemia: is stable k+ 4.3 will monitor     MD is aware of resident's narcotic use and is in agreement with current plan of care. We will attempt to wean resident as appropriate.  Ok Edwards NP [facility]  Contact [phone] Monday through Friday 8am- 5pm  After hours call [phone]

## 2019-10-21 ENCOUNTER — Other Ambulatory Visit: Payer: Self-pay | Admitting: Adult Health

## 2019-10-21 MED ORDER — OXYCODONE HCL 5 MG PO TABS: 5.0000 mg | ORAL_TABLET | Freq: Three times a day (TID) | ORAL | 0 refills | Status: DC

## 2019-10-24 ENCOUNTER — Encounter: Payer: Self-pay | Admitting: Adult Health

## 2019-10-24 ENCOUNTER — Non-Acute Institutional Stay (SKILLED_NURSING_FACILITY): Payer: Medicare Other | Admitting: Adult Health

## 2019-10-24 DIAGNOSIS — L03115 Cellulitis of right lower limb: Secondary | ICD-10-CM

## 2019-10-24 NOTE — Progress Notes (Signed)
Location:    [facility] Room Number: 145/W Place of Service:  SNF (31)   CODE STATUS: DNR  Allergies  Allergen Reactions  . Cephalexin Other (See Comments)    Pt. Doesn't remember.   . Cephalexin Other (See Comments)    Pt. Doesn't remember.   . Penicillins Other (See Comments)    Pt. Cant remember.     Chief Complaint  Patient presents with  . Acute Visit    Wound of Right Heel    HPI:  She has an ulceration on her right heel; which as gotten worse. There is slough in the wound bed; redness and swelling present. She does have increased pain present. There are no reports of fevers present   Past Medical History:  Diagnosis Date  . Thyroid disease     Past Surgical History:  Procedure Laterality Date  . ABDOMINAL HYSTERECTOMY    . TONSILLECTOMY    . TOTAL HIP ARTHROPLASTY     left    Social History   Socioeconomic History  . Marital status: Widowed    Spouse name: Not on file  . Number of children: Not on file  . Years of education: Not on file  . Highest education level: Not on file  Occupational History  . Not on file  Tobacco Use  . Smoking status: Never Smoker  . Smokeless tobacco: Never Used  Substance and Sexual Activity  . Alcohol use: No  . Drug use: No  . Sexual activity: Not Currently  Other Topics Concern  . Not on file  Social History Narrative   Her granddaughter and her family live across the street. Family is very involved and supportive.   Social Determinants of Health   Financial Resource Strain:   . Difficulty of Paying Living Expenses:   Food Insecurity:   . Worried About Charity fundraiser in the Last Year:   . Arboriculturist in the Last Year:   Transportation Needs:   . Film/video editor (Medical):   Marland Kitchen Lack of Transportation (Non-Medical):   Physical Activity:   . Days of Exercise per Week:   . Minutes of Exercise per Session:   Stress:   . Feeling of Stress :   Social Connections:   .  Frequency of Communication with Friends and Family:   . Frequency of Social Gatherings with Friends and Family:   . Attends Religious Services:   . Active Member of Clubs or Organizations:   . Attends Archivist Meetings:   Marland Kitchen Marital Status:   Intimate Partner Violence:   . Fear of Current or Ex-Partner:   . Emotionally Abused:   Marland Kitchen Physically Abused:   . Sexually Abused:    Family History  Problem Relation Age of Onset  . Heart attack Mother   . Heart attack Father       VITAL SIGNS BP 124/60   Pulse 87   Temp 98.4 F (36.9 C) (Oral)   Resp 15   Ht 5' 1"  (1.549 m)   Wt 125 lb 3.2 oz (56.8 kg)   BMI 23.66 kg/m   Outpatient Encounter Medications as of 10/24/2019  Medication Sig  . acetaminophen (TYLENOL) 650 MG CR tablet Take 650 mg by mouth every 6 (six) hours.  Roseanne Kaufman [place]-Castor Oil [facility]) OINT Special Instructions: Apply to sacrum and bilateral buttocks qshift for prevention. Every Shift Day, Evening, Night  . collagenase (SANTYL) ointment Apply 1 application topically daily. Special  Instructions: Apply to right heel wound per tx order daily.  . diclofenac Sodium (VOLTAREN) 1 % GEL Apply 2 g topically every 6 (six) hours as needed. Apply topically to bilateral knees as needed for arthritic pain.  Marland Kitchen docusate sodium (COLACE) 100 MG capsule Take 100 mg by mouth 2 (two) times daily.  Marland Kitchen doxycycline (VIBRAMYCIN) 100 MG capsule Take 100 mg by mouth 2 (two) times daily.  . feeding supplement, ENSURE ENLIVE, (ENSURE ENLIVE) LIQD Take 237 mLs by mouth with breakfast, with lunch, and with evening meal.   . levothyroxine (SYNTHROID) 100 MCG tablet Take 100 mcg by mouth daily before breakfast.  . NON FORMULARY Diet: _____ Regular,  ___x___ NAS,  _______Consistent Carbohydrate,  _______NPO  _____Other  . oxyCODONE (OXY IR/ROXICODONE) 5 MG immediate release tablet Take 1 tablet (5 mg total) by mouth every 8 (eight) hours.   No facility-administered encounter  medications on file as of 10/24/2019.     SIGNIFICANT DIAGNOSTIC EXAMS   PREVIOUS  07-04-19: acute abdomen x-ray: No evidence of bowel obstruction or free air. Cardiomegaly  No acute cardiopulmonary disease.  08-23-19: chest x-ray: No acute bone abnormality. No osteomyelitis is noted.  NO NEW EXAMS.    LABS REVIEWED PREVIOUS  06-10-19: wbc 5.9; hgb 11.6; hct 37.2; mcv 102.8 plt 237 glucose 91; bun 50; creat 1.05 ;k+ 4.4; na++ 141; ca 9.1 liver normal albumin 3.5 07-04-19: wbc 10.4; hgb 11.5; hct 36.6; mcv 103.1 plt 214; glucose 190; bun 97; creat 2.41; k+ 5.3; na++ 141; ca 9.0 ast 42; alt 65; alk phos 158; albumin 3.2   07-04-19: vit B 12: 1014; iron 23;tibc 260 tsh 1.382 free T4: 1.66 urine culture: e-coli  07-15-19: glucose 102; bun 71; creat 1.45; k+ 4.7; na++ 139; ca 9.0   NO NEW LABS.   Review of Systems  Constitutional: Negative for malaise/fatigue.  Respiratory: Negative for cough and shortness of breath.   Cardiovascular: Negative for chest pain, palpitations and leg swelling.  Gastrointestinal: Negative for abdominal pain, constipation and heartburn.  Musculoskeletal: Negative for back pain, joint pain and myalgias.  Skin: Negative.   Neurological: Negative for dizziness.  Psychiatric/Behavioral: The patient is not nervous/anxious.     Physical Exam Constitutional:      General: She is not in acute distress.    Appearance: She is well-developed. She is not diaphoretic.  Neck:     Thyroid: No thyromegaly.  Cardiovascular:     Rate and Rhythm: Normal rate and regular rhythm.     Heart sounds: Normal heart sounds.     Comments: Unable to palpate pedal or post tib pulses  Pulmonary:     Effort: Pulmonary effort is normal. No respiratory distress.     Breath sounds: Normal breath sounds.  Abdominal:     General: Bowel sounds are normal. There is no distension.     Palpations: Abdomen is soft.     Tenderness: There is no abdominal tenderness.  Musculoskeletal:         General: Normal range of motion.     Cervical back: Neck supple.     Right lower leg: Edema present.     Left lower leg: No edema.     Comments: Mild right lower extremity edema   Lymphadenopathy:     Cervical: No cervical adenopathy.  Skin:    General: Skin is warm and dry.     Comments: Left heel: 0.3x 0.7  Right heel: 6 x 4.5 cm slough redness swelling present is painful  Neurological:     Mental Status: She is alert. Mental status is at baseline.  Psychiatric:        Mood and Affect: Mood normal.      ASSESSMENT/ PLAN:  TODAY  1. Right heel cellulitis: is worse: will begin doxycycline 100 mg twice daily through 11-07-19 will continue wound treatment as directed will monitor her status.     MD is aware of resident's narcotic use and is in agreement with current plan of care. We will attempt to wean resident as appropriate.  Ok Edwards NP [facility] Adult [facility]  Contact [phone] Monday through Friday 8am- 5pm  After hours call [phone]

## 2019-10-25 DIAGNOSIS — L03115 Cellulitis of right lower limb: Secondary | ICD-10-CM | POA: Insufficient documentation

## 2019-10-29 ENCOUNTER — Encounter: Payer: Self-pay | Admitting: Adult Health

## 2019-10-29 ENCOUNTER — Non-Acute Institutional Stay (SKILLED_NURSING_FACILITY): Payer: Medicare Other | Admitting: Adult Health

## 2019-10-29 DIAGNOSIS — D638 Anemia in other chronic diseases classified elsewhere: Secondary | ICD-10-CM

## 2019-10-29 DIAGNOSIS — L03115 Cellulitis of right lower limb: Secondary | ICD-10-CM | POA: Diagnosis not present

## 2019-10-29 DIAGNOSIS — N1832 Chronic kidney disease, stage 3b: Secondary | ICD-10-CM | POA: Diagnosis not present

## 2019-10-29 NOTE — Progress Notes (Signed)
Location:  [facility] Room Number: 145/W Place of Service:  SNF (31) Provider:  Kenard Gower, DNP, FNP-BC  [name], NP as PCP - General (Geriatric Medicine) Center, [facility] (Skilled Nursing Facility)  Extended Emergency Contact Information Primary Emergency Contact: Buttler,[name] Address: [address]          [place], [place] [address] [place] Home Phone: [phone] Mobile Phone: [phone] Relation: Son Secondary Emergency Contact: Phifer,[name] Address: [address]          [place], [place] [address] [place] Home Phone: [phone] Mobile Phone: [phone] Relation: Other  Code Status:  DNR  Goals of care: Advanced Directive information Advanced Directives 10/29/2019  Does Patient Have a Medical Advance Directive? Yes  Type of Estate agent of [place];Living will;Out of facility DNR (pink MOST or yellow form)  Does patient want to make changes to medical advance directive? No - Patient declined  Copy of Healthcare Power of Attorney in Chart? Yes - validated most recent copy scanned in chart (See row information)  Would patient like information on creating a medical advance directive? -     Chief Complaint  Patient presents with  . Follow-up    Wound to Right Heel    HPI:  Pt is a 86 y.o. female seen today for follow-up of bilateral heel wounds. She is a long-term care resident of [facility]. She has a PMH of chronic kidney disease, dementia and chronic pain. She was started on Doxycycline on 10/24/19 for right heel cellulitis. Right heel wound has dressing is wet with drainage. Left heel wound is dry with no erythema. No reported fever nor chills. She stated that she has occasional pain on bilateral feet, 8/10, but relieved with PRN Acetaminophen and PRN Oxycodone.   Past Medical History:  Diagnosis Date  . Thyroid disease    Past  Surgical History:  Procedure Laterality Date  . ABDOMINAL HYSTERECTOMY    . TONSILLECTOMY    . TOTAL HIP ARTHROPLASTY     left    Allergies  Allergen Reactions  . Cephalexin Other (See Comments)    Pt. Doesn't remember.   . Cephalexin Other (See Comments)    Pt. Doesn't remember.   . Penicillins Other (See Comments)    Pt. Cant remember.     Outpatient Encounter Medications as of 10/29/2019  Medication Sig  . acetaminophen (TYLENOL) 650 MG CR tablet Take 650 mg by mouth every 6 (six) hours.  Lucilla Lame [place]-Castor Oil [facility]) OINT Special Instructions: Apply to sacrum and bilateral buttocks qshift for prevention. Every Shift Day, Evening, Night  . collagenase (SANTYL) ointment Apply 1 application topically daily. Special Instructions: Apply to right heel wound per tx order daily.  Marland Kitchen docusate sodium (COLACE) 100 MG capsule Take 100 mg by mouth 2 (two) times daily.  Marland Kitchen doxycycline (VIBRAMYCIN) 100 MG capsule Take 100 mg by mouth 2 (two) times daily.  . feeding supplement, ENSURE ENLIVE, (ENSURE ENLIVE) LIQD Take 237 mLs by mouth with breakfast, with lunch, and with evening meal.   . levothyroxine (SYNTHROID) 100 MCG tablet Take 100 mcg by mouth daily before breakfast.  . loratadine (CLARITIN) 10 MG tablet Take 10 mg by mouth daily. Chronic Allergies  . NON FORMULARY Diet: _____ Regular,  ___x___ NAS,  _______Consistent Carbohydrate,  _______NPO  _____Other  . oxyCODONE (OXY IR/ROXICODONE) 5 MG immediate release tablet Take 1 tablet (5 mg total) by mouth every  8 (eight) hours.  . [DISCONTINUED] diclofenac Sodium (VOLTAREN) 1 % GEL Apply 2 g topically every 6 (six) hours as needed. Apply topically to bilateral knees as needed for arthritic pain.   No facility-administered encounter medications on file as of 10/29/2019.    Review of Systems  GENERAL: No fever nor chills  MOUTH and THROAT: Denies oral discomfort, gingival pain or bleeding RESPIRATORY: no cough, SOB, DOE, wheezing,  hemoptysis CARDIAC: No chest pain, edema or palpitations GI: No abdominal pain, diarrhea, constipation, heart burn, nausea or vomiting GU: Denies dysuria, frequency, hematuria or discharge NEUROLOGICAL: Denies dizziness, syncope, numbness, or headache PSYCHIATRIC: Denies feelings of depression or anxiety. No report of hallucinations, insomnia, paranoia, or agitation   Immunization History  Administered Date(s) Administered  . Influenza-Unspecified 02/09/2018  . Moderna SARS-COVID-2 Vaccination 04/25/2019, 05/24/2019   Pertinent  Health Maintenance Due  Topic Date Due  . DEXA SCAN  Never done  . PNA vac Low Risk Adult (1 of 2 - PCV13) Never done  . INFLUENZA VACCINE  11/10/2019   No flowsheet data found.   Vitals:   10/29/19 1358  BP: 136/76  Pulse: 72  Resp: 15  Temp: 97.9 F (36.6 C)  TempSrc: Oral  Weight: 125 lb 3.2 oz (56.8 kg)  Height: 5\' 1"  (1.549 m)   Body mass index is 23.66 kg/m.  Physical Exam  GENERAL APPEARANCE: Well nourished. In no acute distress. Normal body habitus SKIN: Bilateral heels with dressing, see HPI MOUTH and THROAT: Lips are without lesions. Oral mucosa is moist and without lesions. Tongue is normal in shape, size, and color and without lesions RESPIRATORY: Breathing is even & unlabored, BS CTAB CARDIAC: RRR, no murmur,no extra heart sounds GI: Abdomen soft, normal BS, no masses, no tenderness EXTREMITIES: Able to move x4 extremities NEUROLOGICAL: There is no tremor. Speech is clear.  Alert to self and place, disoriented to time. PSYCHIATRIC:  Affect and behavior are appropriate  Labs reviewed: Recent Labs    07/04/19 1123 07/04/19 2050 07/05/19 0540 07/15/19 0720  NA 141  --  144 139  K 5.3*  --  4.3 4.7  CL 102  --  107 99  CO2 25  --  25 29  GLUCOSE 190*  --  90 102*  BUN 97*  --  75* 71*  CREATININE 2.41*  --  1.57* 1.45*  CALCIUM 9.0  --  8.6* 9.0  MG  --  2.8*  --   --    Recent Labs    06/10/19 0313 07/04/19 1123    AST 19 42*  ALT 13 65*  ALKPHOS 72 158*  BILITOT 0.8 0.6  PROT 7.0 7.5  ALBUMIN 3.5 3.2*   Recent Labs    06/10/19 0313 07/04/19 1155 07/05/19 0540  WBC 5.9 10.4 7.8  NEUTROABS  --  8.3*  --   HGB 11.6* 11.5* 10.6*  HCT 37.2 36.6 34.3*  MCV 102.8* 103.1* 104.6*  PLT 237 214 190   Lab Results  Component Value Date   TSH 2.447 07/26/2019    Assessment/Plan  1. Cellulitis of right heel -Continue doxycycline for a total of 2 weeks -Bilateral heel wound treatment daily  2. Stage 3b chronic kidney disease Lab Results  Component Value Date   NA 139 07/15/2019   K 4.7 07/15/2019   CO2 29 07/15/2019   GLUCOSE 102 (H) 07/15/2019   BUN 71 (H) 07/15/2019   CREATININE 1.45 (H) 07/15/2019   CALCIUM 9.0 07/15/2019   GFRNONAA 30 (L)  07/15/2019   GFRAA 35 (L) 07/15/2019   -Recheck BMP  3. Anemia of chronic disease Lab Results  Component Value Date   HGB 10.6 (L) 07/05/2019   -Recheck CBC     Family/ staff Communication: Discussed plan of care with resident and RN supervisor.  Labs/tests ordered: CBC and BMP  Goals of care:   Long-term care   Kenard Gower, DNP, FNP-BC [facility] and Adult Medicine [phone] (Monday-Friday 8:00 a.m. - 5:00 p.m.) [phone] (after hours)

## 2019-10-31 ENCOUNTER — Other Ambulatory Visit (HOSPITAL_COMMUNITY)
Admission: RE | Admit: 2019-10-31 | Discharge: 2019-10-31 | Disposition: A | Discharge status: Home / Self Care | Payer: Medicare HMO | Source: Skilled Nursing Facility | Attending: Internal Medicine | Admitting: Internal Medicine

## 2019-10-31 DIAGNOSIS — E876 Hypokalemia: Secondary | ICD-10-CM | POA: Insufficient documentation

## 2019-10-31 DIAGNOSIS — N1832 Chronic kidney disease, stage 3b: Secondary | ICD-10-CM | POA: Diagnosis not present

## 2019-10-31 DIAGNOSIS — D649 Anemia, unspecified: Secondary | ICD-10-CM | POA: Insufficient documentation

## 2019-10-31 LAB — CBC
HCT: 38.5 % (ref 36.0–46.0)
Hemoglobin: 11.8 g/dL — ABNORMAL LOW (ref 12.0–15.0)
MCH: 32.1 pg (ref 26.0–34.0)
MCHC: 30.6 g/dL (ref 30.0–36.0)
MCV: 104.6 fL — ABNORMAL HIGH (ref 80.0–100.0)
Platelets: 311 10*3/uL (ref 150–400)
RBC: 3.68 MIL/uL — ABNORMAL LOW (ref 3.87–5.11)
RDW: 15.4 % (ref 11.5–15.5)
WBC: 7.6 10*3/uL (ref 4.0–10.5)
nRBC: 0 % (ref 0.0–0.2)

## 2019-10-31 LAB — BASIC METABOLIC PANEL
Anion gap: 15 (ref 5–15)
BUN: 68 mg/dL — ABNORMAL HIGH (ref 8–23)
CO2: 25 mmol/L (ref 22–32)
Calcium: 9.2 mg/dL (ref 8.9–10.3)
Chloride: 99 mmol/L (ref 98–111)
Creatinine, Ser: 1.31 mg/dL — ABNORMAL HIGH (ref 0.44–1.00)
GFR calc Af Amer: 39 mL/min — ABNORMAL LOW (ref >60)
GFR calc non Af Amer: 34 mL/min — ABNORMAL LOW (ref >60)
Glucose, Bld: 156 mg/dL — ABNORMAL HIGH (ref 70–99)
Potassium: 4 mmol/L (ref 3.5–5.1)
Sodium: 139 mmol/L (ref 135–145)

## 2019-11-06 ENCOUNTER — Non-Acute Institutional Stay (SKILLED_NURSING_FACILITY): Payer: Medicare Other | Admitting: Adult Health

## 2019-11-06 ENCOUNTER — Encounter: Payer: Self-pay | Admitting: Adult Health

## 2019-11-06 DIAGNOSIS — L03115 Cellulitis of right lower limb: Secondary | ICD-10-CM | POA: Diagnosis not present

## 2019-11-06 DIAGNOSIS — R32 Unspecified urinary incontinence: Secondary | ICD-10-CM | POA: Diagnosis not present

## 2019-11-06 DIAGNOSIS — R627 Adult failure to thrive: Secondary | ICD-10-CM

## 2019-11-06 DIAGNOSIS — E039 Hypothyroidism, unspecified: Secondary | ICD-10-CM

## 2019-11-06 NOTE — Progress Notes (Signed)
Location:    [facility] Room Number: 145/W Place of Service:  SNF (31)   CODE STATUS: DNR  Allergies  Allergen Reactions   Cephalexin Other (See Comments)    Pt. Doesn't remember.    Cephalexin Other (See Comments)    Pt. Doesn't remember.    Penicillins Other (See Comments)    Pt. Cant remember.     Chief Complaint  Patient presents with   Medical Management of Chronic Issues        Failure to thrive in adult:    Urinary incontinence in female:  Acquired hypothyroidism    HPI:  She is a 86 year old long term resident of this facility being seen for the management of her chronic illnesses; failure to thrive in adult; urinary incontinence in female; acquired hypothyroidism. She continues to be followed by hospice care. She is presently on abt for her right heel cellulitis. There are no reports of uncontrolled pain; no reports of constipation; excessive fatigue.   Past Medical History:  Diagnosis Date   Thyroid disease     Past Surgical History:  Procedure Laterality Date   ABDOMINAL HYSTERECTOMY     TONSILLECTOMY     TOTAL HIP ARTHROPLASTY     left    Social History   Socioeconomic History   Marital status: Widowed    Spouse name: Not on file   Number of children: Not on file   Years of education: Not on file   Highest education level: Not on file  Occupational History   Not on file  Tobacco Use   Smoking status: Never Smoker   Smokeless tobacco: Never Used  Substance and Sexual Activity   Alcohol use: No   Drug use: No   Sexual activity: Not Currently  Other Topics Concern   Not on file  Social History Narrative   Her granddaughter and her family live across the street. Family is very involved and supportive.   Social Determinants of Health   Financial Resource Strain:    Difficulty of Paying Living Expenses:   Food Insecurity:    Worried About Charity fundraiser in the Last Year:    Arboriculturist in  the Last Year:   Transportation Needs:    Film/video editor (Medical):    Lack of Transportation (Non-Medical):   Physical Activity:    Days of Exercise per Week:    Minutes of Exercise per Session:   Stress:    Feeling of Stress :   Social Connections:    Frequency of Communication with Friends and Family:    Frequency of Social Gatherings with Friends and Family:    Attends Religious Services:    Active Member of Clubs or Organizations:    Attends Music therapist:    Marital Status:   Intimate Partner Violence:    Fear of Current or Ex-Partner:    Emotionally Abused:    Physically Abused:    Sexually Abused:    Family History  Problem Relation Age of Onset   Heart attack Mother    Heart attack Father       VITAL SIGNS BP (!) 137/64    Pulse 94    Temp 98.4 F (36.9 C) (Oral)    Resp 15    Ht 5' 1"  (1.549 m)    Wt 125 lb 3.2 oz (56.8 kg)    BMI 23.66 kg/m   Outpatient Encounter Medications as of 11/06/2019  Medication  Sig   acetaminophen (TYLENOL) 650 MG CR tablet Take 650 mg by mouth every 6 (six) hours.   Balsam [place]-Castor Oil [facility]) OINT Special Instructions: Apply to sacrum and bilateral buttocks qshift for prevention. Every Shift Day, Evening, Night   collagenase (SANTYL) ointment Apply 1 application topically daily. Special Instructions: Apply to right heel wound per tx order daily.   docusate sodium (COLACE) 100 MG capsule Take 100 mg by mouth 2 (two) times daily.   doxycycline (VIBRAMYCIN) 100 MG capsule Take 100 mg by mouth 2 (two) times daily.   feeding supplement, ENSURE ENLIVE, (ENSURE ENLIVE) LIQD Take 237 mLs by mouth with breakfast, with lunch, and with evening meal.    levothyroxine (SYNTHROID) 100 MCG tablet Take 100 mcg by mouth daily before breakfast.   loratadine (CLARITIN) 10 MG tablet Take 10 mg by mouth daily. Chronic Allergies   NON FORMULARY Diet: _____ Regular,  ___x___ NAS,  _______Consistent  Carbohydrate,  _______NPO  _____Other   oxyCODONE (OXY IR/ROXICODONE) 5 MG immediate release tablet Take 1 tablet (5 mg total) by mouth every 8 (eight) hours.   No facility-administered encounter medications on file as of 11/06/2019.     SIGNIFICANT DIAGNOSTIC EXAMS   PREVIOUS  07-04-19: acute abdomen x-ray: No evidence of bowel obstruction or free air. Cardiomegaly  No acute cardiopulmonary disease.  TODAY  08-23-19: chest x-ray: No acute bone abnormality. No osteomyelitis is noted.   LABS REVIEWED PREVIOUS  06-10-19: wbc 5.9; hgb 11.6; hct 37.2; mcv 102.8 plt 237 glucose 91; bun 50; creat 1.05 ;k+ 4.4; na++ 141; ca 9.1 liver normal albumin 3.5 07-04-19: wbc 10.4; hgb 11.5; hct 36.6; mcv 103.1 plt 214; glucose 190; bun 97; creat 2.41; k+ 5.3; na++ 141; ca 9.0 ast 42; alt 65; alk phos 158; albumin 3.2   07-04-19: vit B 12: 1014; iron 23;tibc 260 tsh 1.382 free T4: 1.66 urine culture: e-coli  07-15-19: glucose 102; bun 71; creat 1.45; k+ 4.7; na++ 139; ca 9.0   TODAY  07-26-19: tsh 2.447 10-31-19: wbc 7.6; hg 11.8; hct 38.5; mcv 104.6 plt 311; glucose 156; bun 68; creat 1.31; k+ 4.0; na++ 139; ca 9.2   Review of Systems  Constitutional: Negative for malaise/fatigue.  Respiratory: Negative for cough and shortness of breath.   Cardiovascular: Negative for chest pain, palpitations and leg swelling.  Gastrointestinal: Negative for abdominal pain, constipation and heartburn.  Musculoskeletal: Negative for back pain, joint pain and myalgias.  Skin: Negative.   Neurological: Negative for dizziness.  Psychiatric/Behavioral: The patient is not nervous/anxious.     Physical Exam Constitutional:      General: She is not in acute distress.    Appearance: She is well-developed. She is not diaphoretic.  Neck:     Thyroid: No thyromegaly.  Cardiovascular:     Rate and Rhythm: Normal rate and regular rhythm.     Heart sounds: Normal heart sounds.     Comments: Unable to palpate pedal or  post tib pulses  Pulmonary:     Effort: Pulmonary effort is normal. No respiratory distress.     Breath sounds: Normal breath sounds.  Abdominal:     General: Bowel sounds are normal. There is no distension.     Palpations: Abdomen is soft.     Tenderness: There is no abdominal tenderness.  Musculoskeletal:        General: Normal range of motion.     Cervical back: Neck supple.     Right lower leg: No edema.  Left lower leg: No edema.  Lymphadenopathy:     Cervical: No cervical adenopathy.  Skin:    General: Skin is warm and dry.     Comments: Left heel: 0.3 x 0.4 cm Right heel: 6 x 4.4 cm  Neurological:     Mental Status: She is alert. Mental status is at baseline.  Psychiatric:        Mood and Affect: Mood normal.          ASSESSMENT/ PLAN:  TODAY  1. Failure to thrive in adult: is stable weight is 125 pounds will continue supplements as directed; unable to tolerate remeron  2. Urinary incontinence in female: is off medication will monitor   3. Acquired hypothyroidism: is stable tsh 2.447 will continue synthroid 100 mcg daily   4. Right heel cellulitis: is slightly improved; will complete doxycycline and will monitor her status.    PREVIOUS  5. Chronic constipation: is stable will continue colace twice daily   6. Hypokalemia: is stable k+ 4.0 will monitor   7. CKD stage 3b: is without change bun 68; creat 1.37 will monitor   8. Advanced dementia: is stable weight is 125 pounds will monitor unfortunately weight loss in the late stages of this disease process is expected.   9. Chronic generalized pain: is stable will continue oxycodone 5 mg every 8 hours.      MD is aware of resident's narcotic use and is in agreement with current plan of care. We will attempt to wean resident as appropriate.  Ok Edwards NP [facility]  Contact [phone] Monday through Friday 8am- 5pm  After hours call [phone]

## 2019-11-18 ENCOUNTER — Other Ambulatory Visit: Payer: Self-pay | Admitting: Adult Health

## 2019-11-18 MED ORDER — OXYCODONE HCL 5 MG PO TABS: 5.0000 mg | ORAL_TABLET | Freq: Three times a day (TID) | ORAL | 0 refills | Status: DC

## 2019-11-29 ENCOUNTER — Encounter: Payer: Self-pay | Admitting: Adult Health

## 2019-11-29 ENCOUNTER — Non-Acute Institutional Stay (SKILLED_NURSING_FACILITY): Payer: Medicare Other | Admitting: Adult Health

## 2019-11-29 DIAGNOSIS — D649 Anemia, unspecified: Secondary | ICD-10-CM | POA: Diagnosis not present

## 2019-11-29 DIAGNOSIS — Z0000 Encounter for general adult medical examination without abnormal findings: Secondary | ICD-10-CM | POA: Insufficient documentation

## 2019-11-29 DIAGNOSIS — Z1159 Encounter for screening for other viral diseases: Secondary | ICD-10-CM | POA: Diagnosis not present

## 2019-11-29 NOTE — Progress Notes (Signed)
Subjective:   Kimberly Velazquez is a 86 y.o. female who presents for Medicare Annual (Subsequent) preventive examination.  Long term resident of [facility] is followed by hospice care.   Review of Systems  Constitutional: Negative for malaise/fatigue.  Respiratory: Negative for cough and shortness of breath.   Cardiovascular: Negative for chest pain, palpitations and leg swelling.  Gastrointestinal: Negative for abdominal pain, constipation and heartburn.  Musculoskeletal: Negative for back pain, joint pain and myalgias.  Skin: Negative.   Neurological: Negative for dizziness.  Psychiatric/Behavioral: The patient is not nervous/anxious.      Cardiac Risk Factors include: advanced age (>50men, >27 women);sedentary lifestyle     Objective:    Today's Vitals   11/29/19 0947  BP: 107/66  Pulse: 71  Resp: 15  Temp: 98.1 F (36.7 C)  TempSrc: Oral  SpO2: 90%  Weight: 117 lb 3.2 oz (53.2 kg)  Height: 5\' 1"  (1.549 m)   Body mass index is 22.14 kg/m.  Advanced Directives 11/29/2019 11/06/2019 10/29/2019 10/24/2019 10/08/2019 09/25/2019 09/05/2019  Does Patient Have a Medical Advance Directive? Yes Yes Yes Yes Yes Yes Yes  Type of 09/07/2019 of [place];Living will;Out of facility DNR (pink MOST or yellow form) Healthcare Power of [place];Living will;Out of facility DNR (pink MOST or yellow form) Healthcare Power of [place];Living will;Out of facility DNR (pink MOST or yellow form) Healthcare Power of [place];Living will;Out of facility DNR (pink MOST or yellow form) Healthcare Power of [place];Living will;Out of facility DNR (pink MOST or yellow form) Healthcare Power of [place];Living will;Out of facility DNR (pink MOST or yellow form) Healthcare Power of [place];Out of facility DNR (pink MOST or yellow form);Living will  Does patient want to make changes to medical advance directive? No - Patient declined No - Patient declined No - Patient declined No - Patient  declined No - Patient declined No - Patient declined No - Patient declined  Copy of Healthcare Power of Attorney in Chart? Yes - validated most recent copy scanned in chart (See row information) Yes - validated most recent copy scanned in chart (See row information) Yes - validated most recent copy scanned in chart (See row information) Yes - validated most recent copy scanned in chart (See row information) Yes - validated most recent copy scanned in chart (See row information) Yes - validated most recent copy scanned in chart (See row information) Yes - validated most recent copy scanned in chart (See row information)  Would patient like information on creating a medical advance directive? - - - - - - -    Current Medications (verified) Outpatient Encounter Medications as of 11/29/2019  Medication Sig  . acetaminophen (TYLENOL) 650 MG CR tablet Take 650 mg by mouth every 6 (six) hours.  12/01/2019 [place]-Castor Oil [facility]) OINT Special Instructions: Apply to sacrum and bilateral buttocks qshift for prevention. Every Shift Day, Evening, Night  . collagenase (SANTYL) ointment Apply 1 application topically daily. Special Instructions: Apply to right heel wound per tx order daily.  [facility] docusate sodium (COLACE) 100 MG capsule Take 100 mg by mouth 2 (two) times daily.  . feeding supplement, ENSURE ENLIVE, (ENSURE ENLIVE) LIQD Take 237 mLs by mouth with breakfast, with lunch, and with evening meal.   . levothyroxine (SYNTHROID) 100 MCG tablet Take 100 mcg by mouth daily before breakfast.  . loratadine (CLARITIN) 10 MG tablet Take 10 mg by mouth daily. Chronic Allergies  . NON FORMULARY Diet: _____ Regular,  ___x___ NAS,  _______Consistent Carbohydrate,  _______NPO  _____Other  . oxyCODONE (OXY IR/ROXICODONE) 5 MG immediate release tablet Take 1 tablet (5 mg total) by mouth every 8 (eight) hours.   No facility-administered encounter medications on file as of 11/29/2019.    Allergies  (verified) Cephalexin, Cephalexin, and Penicillins   History: Past Medical History:  Diagnosis Date  . Thyroid disease    Past Surgical History:  Procedure Laterality Date  . ABDOMINAL HYSTERECTOMY    . TONSILLECTOMY    . TOTAL HIP ARTHROPLASTY     left   Family History  Problem Relation Age of Onset  . Heart attack Mother   . Heart attack Father   . Hypertension Father    Social History   Socioeconomic History  . Marital status: Widowed    Spouse name: Not on file  . Number of children: Not on file  . Years of education: Not on file  . Highest education level: Not on file  Occupational History  . Occupation: retired   Tobacco Use  . Smoking status: Never Smoker  . Smokeless tobacco: Never Used  Vaping Use  . Vaping Use: Never used  Substance and Sexual Activity  . Alcohol use: No  . Drug use: No  . Sexual activity: Not Currently  Other Topics Concern  . Not on file  Social History Narrative   Long term resident of [facility] .   Social Determinants of Health   Financial Resource Strain:   . Difficulty of Paying Living Expenses: Not on file  Food Insecurity:   . Worried About Programme researcher, broadcasting/film/video in the Last Year: Not on file  . Ran Out of Food in the Last Year: Not on file  Transportation Needs:   . Lack of Transportation (Medical): Not on file  . Lack of Transportation (Non-Medical): Not on file  Physical Activity:   . Days of Exercise per Week: Not on file  . Minutes of Exercise per Session: Not on file  Stress:   . Feeling of Stress : Not on file  Social Connections:   . Frequency of Communication with Friends and Family: Not on file  . Frequency of Social Gatherings with Friends and Family: Not on file  . Attends Religious Services: Not on file  . Active Member of Clubs or Organizations: Not on file  . Attends Banker Meetings: Not on file  . Marital Status: Not on file    Tobacco Counseling Counseling given: Not Answered   Clinical  Intake:  Pre-visit preparation completed: Yes  Pain : No/denies pain     BMI - recorded: 22.14 Nutritional Status: BMI of 19-24  Normal Diabetes: No     Diabetic?no  Interpreter Needed?: No      Activities of Daily Living In your present state of health, do you have any difficulty performing the following activities: 11/29/2019 07/05/2019  Hearing? N N  Vision? N Y  Difficulty concentrating or making decisions? [place]  Walking or climbing stairs? Y Y  Dressing or bathing? Y Y  Doing errands, shopping? Y N  Preparing Food and eating ? Y -  Using the Toilet? Y -  In the past six months, have you accidently leaked urine? Y -  Do you have problems with loss of bowel control? Y -  Managing your Medications? Y -  Managing your Finances? Y -  Housekeeping or managing your Housekeeping? Y -  Some recent data might be hidden    [name], NP  as PCP - [facility]  Indicate any recent Medical Services you may have received from other than [facility] providers in the past year (date may be approximate).     Assessment:   This is a routine wellness examination for [place].  Hearing/Vision screen No exam data present  Dietary issues and exercise activities discussed: Current Exercise Habits: The patient does not participate in regular exercise at present  Goals    . DIET - INCREASE WATER INTAKE    . Follow up with Provider as scheduled    . General - Client will not be readmitted within 30 days ([facility])      Depression Screen PHQ 2/9 Scores 11/29/2019  PHQ - 2 Score 0    Fall Risk Fall Risk  11/29/2019  Falls in the past year? 1  Number falls in past yr: 0  Injury with Fall? 0  Risk for fall due to : Impaired balance/gait;Impaired mobility    Any stairs in or around the home? no If so, are there any without handrails?n/a Home free of loose throw rugs in walkways, pet beds,  electrical cords, etc? yes Adequate lighting in your home to reduce risk of falls?yes  ASSISTIVE DEVICES UTILIZED TO PREVENT FALLS:  Life alert?n/a Use of a cane, walker or w/c? Wheelchair  Grab bars in the bathroom? yes Shower chair or bench in shower? Yes Elevated toilet seat or a handicapped toilet? Yes   TIMED UP AND GO:  Was the test performed? no.  Length of time to ambulate wheelchair bound    Immunizations Immunization History  Administered Date(s) Administered  . Influenza-Unspecified 02/09/2018  . Moderna SARS-COVID-2 Vaccination 04/25/2019, 05/24/2019    Screening Tests Health Maintenance  Topic Date Due  . TETANUS/TDAP  Never done  . DEXA SCAN  Never done  . PNA vac Low Risk Adult (1 of 2 - PCV13) Never done  . INFLUENZA VACCINE  11/10/2019  . COVID-19 Vaccine  Completed    Health Maintenance  Health Maintenance Due  Topic Date Due  . TETANUS/TDAP  Never done  . DEXA SCAN  Never done  . PNA vac Low Risk Adult (1 of 2 - PCV13) Never done  . INFLUENZA VACCINE  11/10/2019    Lung Cancer Screening: (Low Dose CT Chest recommended if Age 16-80 years, 30 pack-year currently smoking OR have quit w/in 15years.) does not  qualify.   Lung Cancer Screening Referral: n/a Additional Screening:  Hepatitis C Screening: does not  qualify; Completed  Dental Screening: Recommended annual dental exams for proper oral hygiene  Community Resource Referral / Chronic Care Management: CRR required this visit? No  CCM required this visit?  No     Plan:     I have personally reviewed and noted the following in the patient's chart:   . Medical and social history . Use of alcohol, tobacco or illicit drugs  . Current medications and supplements . Functional ability and status . Nutritional status . Physical activity . Advanced directives . List of other physicians . Hospitalizations, surgeries, and ER visits in previous 12 months . Vitals . Screenings to include  cognitive, depression, and falls . Referrals and appointments  In addition, I have reviewed and discussed with patient certain preventive protocols, quality metrics, and best practice recommendations. A written personalized care plan for preventive services as well as general preventive health recommendations were provided to patient.     Sharee Holster, NP   11/29/2019

## 2019-12-02 ENCOUNTER — Non-Acute Institutional Stay (SKILLED_NURSING_FACILITY): Payer: Medicare Other | Admitting: Adult Health

## 2019-12-02 ENCOUNTER — Encounter: Payer: Self-pay | Admitting: Adult Health

## 2019-12-02 DIAGNOSIS — L03115 Cellulitis of right lower limb: Secondary | ICD-10-CM | POA: Diagnosis not present

## 2019-12-02 DIAGNOSIS — L89613 Pressure ulcer of right heel, stage 3: Secondary | ICD-10-CM | POA: Diagnosis not present

## 2019-12-02 DIAGNOSIS — Z1159 Encounter for screening for other viral diseases: Secondary | ICD-10-CM | POA: Diagnosis not present

## 2019-12-02 DIAGNOSIS — D649 Anemia, unspecified: Secondary | ICD-10-CM | POA: Diagnosis not present

## 2019-12-02 NOTE — Progress Notes (Signed)
Location:    [place] Room Number: 145/W Place of Service:  SNF (31)   CODE STATUS: DNR  Allergies  Allergen Reactions  . Cephalexin Other (See Comments)    Pt. Doesn't remember.   . Cephalexin Other (See Comments)    Pt. Doesn't remember.   . Penicillins Other (See Comments)    Pt. Cant remember.     Chief Complaint  Patient presents with  . Acute Visit    Right Heel Infection    HPI:  She has a right heel ulceration for which she has had several courses of doxycycline. Today the heel is red with the redness on the foot and lower leg. The area is swollen and tender to touch. There are no reports of fevers present.   Past Medical History:  Diagnosis Date  . Thyroid disease     Past Surgical History:  Procedure Laterality Date  . ABDOMINAL HYSTERECTOMY    . TONSILLECTOMY    . TOTAL HIP ARTHROPLASTY     left    Social History   Socioeconomic History  . Marital status: Widowed    Spouse name: Not on file  . Number of children: Not on file  . Years of education: Not on file  . Highest education level: Not on file  Occupational History  . Occupation: retired   Tobacco Use  . Smoking status: Never Smoker  . Smokeless tobacco: Never Used  Vaping Use  . Vaping Use: Never used  Substance and Sexual Activity  . Alcohol use: No  . Drug use: No  . Sexual activity: Not Currently  Other Topics Concern  . Not on file  Social History Narrative   Long term resident of [facility] .   Social Determinants of Health   Financial Resource Strain:   . Difficulty of Paying Living Expenses: Not on file  Food Insecurity:   . Worried About Charity fundraiser in the Last Year: Not on file  . Ran Out of Food in the Last Year: Not on file  Transportation Needs:   . Lack of Transportation (Medical): Not on file  . Lack of Transportation (Non-Medical): Not on file  Physical Activity:   . Days of Exercise per Week: Not on file  . Minutes of Exercise per  Session: Not on file  Stress:   . Feeling of Stress : Not on file  Social Connections:   . Frequency of Communication with Friends and Family: Not on file  . Frequency of Social Gatherings with Friends and Family: Not on file  . Attends Religious Services: Not on file  . Active Member of Clubs or Organizations: Not on file  . Attends Archivist Meetings: Not on file  . Marital Status: Not on file  Intimate Partner Violence:   . Fear of Current or Ex-Partner: Not on file  . Emotionally Abused: Not on file  . Physically Abused: Not on file  . Sexually Abused: Not on file   Family History  Problem Relation Age of Onset  . Heart attack Mother   . Heart attack Father   . Hypertension Father       VITAL SIGNS BP (!) 115/54   Pulse 92   Temp 98.2 F (36.8 C) (Oral)   Ht 5' 1" (1.549 m)   Wt 117 lb 3.2 oz (53.2 kg)   BMI 22.14 kg/m   Outpatient Encounter Medications as of 12/02/2019  Medication Sig  . acetaminophen (TYLENOL) 650 MG  CR tablet Take 650 mg by mouth every 6 (six) hours.  Roseanne Kaufman [place]-Castor Oil [facility]) OINT Special Instructions: Apply to sacrum and bilateral buttocks qshift for prevention. Every Shift Day, Evening, Night  . collagenase (SANTYL) ointment Apply 1 application topically daily. Special Instructions: Apply to right heel wound per tx order daily.  Marland Kitchen docusate sodium (COLACE) 100 MG capsule Take 100 mg by mouth 2 (two) times daily.  . feeding supplement, ENSURE ENLIVE, (ENSURE ENLIVE) LIQD Take 237 mLs by mouth with breakfast, with lunch, and with evening meal.   . levothyroxine (SYNTHROID) 100 MCG tablet Take 100 mcg by mouth daily before breakfast.  . loratadine (CLARITIN) 10 MG tablet Take 10 mg by mouth daily. Chronic Allergies  . NON FORMULARY Diet: _____ Regular,  ___x___ NAS,  _______Consistent Carbohydrate,  _______NPO  _____Other  . oxyCODONE (OXY IR/ROXICODONE) 5 MG immediate release tablet Take 1 tablet (5 mg total) by mouth every  8 (eight) hours.   No facility-administered encounter medications on file as of 12/02/2019.     SIGNIFICANT DIAGNOSTIC EXAMS   PREVIOUS  07-04-19: acute abdomen x-ray: No evidence of bowel obstruction or free air. Cardiomegaly  No acute cardiopulmonary disease.  TODAY  08-23-19: chest x-ray: No acute bone abnormality. No osteomyelitis is noted.   LABS REVIEWED PREVIOUS  06-10-19: wbc 5.9; hgb 11.6; hct 37.2; mcv 102.8 plt 237 glucose 91; bun 50; creat 1.05 ;k+ 4.4; na++ 141; ca 9.1 liver normal albumin 3.5 07-04-19: wbc 10.4; hgb 11.5; hct 36.6; mcv 103.1 plt 214; glucose 190; bun 97; creat 2.41; k+ 5.3; na++ 141; ca 9.0 ast 42; alt 65; alk phos 158; albumin 3.2   07-04-19: vit B 12: 1014; iron 23;tibc 260 tsh 1.382 free T4: 1.66 urine culture: e-coli  07-15-19: glucose 102; bun 71; creat 1.45; k+ 4.7; na++ 139; ca 9.0  07-26-19: tsh 2.447 10-31-19: wbc 7.6; hg 11.8; hct 38.5; mcv 104.6 plt 311; glucose 156; bun 68; creat 1.31; k+ 4.0; na++ 139; ca 9.2  NO NEW LABS.   Review of Systems  Constitutional: Negative for malaise/fatigue.  Respiratory: Negative for cough and shortness of breath.   Cardiovascular: Negative for chest pain, palpitations and leg swelling.  Gastrointestinal: Negative for abdominal pain, constipation and heartburn.  Musculoskeletal: Negative for back pain, joint pain and myalgias.  Skin:       Sore right heel   Neurological: Negative for dizziness.  Psychiatric/Behavioral: The patient is not nervous/anxious.     Physical Exam Constitutional:      General: She is not in acute distress.    Appearance: She is well-developed. She is not diaphoretic.  Neck:     Thyroid: No thyromegaly.  Cardiovascular:     Rate and Rhythm: Normal rate and regular rhythm.     Heart sounds: Normal heart sounds.     Comments: Unable to palpate pedal or post tib pulses  Pulmonary:     Effort: Pulmonary effort is normal. No respiratory distress.     Breath sounds: Normal breath  sounds.  Abdominal:     General: Bowel sounds are normal. There is no distension.     Palpations: Abdomen is soft.     Tenderness: There is no abdominal tenderness.  Musculoskeletal:        General: Normal range of motion.     Cervical back: Neck supple.     Right lower leg: No edema.     Left lower leg: No edema.  Lymphadenopathy:     Cervical: No  cervical adenopathy.  Skin:    General: Skin is warm and dry.     Comments: Right heel stage 3: 6 x 4 x 0.1cm: the area is red hot swollen with redness extending to foot and lower leg  Neurological:     Mental Status: She is alert. Mental status is at baseline.  Psychiatric:        Mood and Affect: Mood normal.        ASSESSMENT/ PLAN:  TODAY  1. Right heel stage 3 pressure ulceration 2. Right heel cellulitis  Is worse Will begin zithromax 500 mg daily through 12-12-19 Will monitor her status.   MD is aware of resident's narcotic use and is in agreement with current plan of care. We will attempt to wean resident as appropriate.  Ok Edwards NP [facility]  Contact [phone] Monday through Friday 8am- 5pm  After hours call [phone]

## 2019-12-06 ENCOUNTER — Non-Acute Institutional Stay: Payer: Self-pay | Admitting: Adult Health

## 2019-12-06 ENCOUNTER — Encounter: Payer: Self-pay | Admitting: Adult Health

## 2019-12-06 DIAGNOSIS — G8929 Other chronic pain: Secondary | ICD-10-CM

## 2019-12-09 DIAGNOSIS — D649 Anemia, unspecified: Secondary | ICD-10-CM | POA: Diagnosis not present

## 2019-12-09 DIAGNOSIS — Z1159 Encounter for screening for other viral diseases: Secondary | ICD-10-CM | POA: Diagnosis not present

## 2019-12-09 NOTE — Progress Notes (Signed)
Location:   [facility] Room Number: 145/W Place of Service:  SNF (31)   CODE STATUS: dnr  Allergies  Allergen Reactions  . Cephalexin Other (See Comments)    Pt. Doesn't remember.   . Cephalexin Other (See Comments)    Pt. Doesn't remember.   . Penicillins Other (See Comments)    Pt. Cant remember.     Chief Complaint  Patient presents with  . Acute Visit    medication review     HPI:  She is presently taking oxycodone 5 mg every 8 hours for pain management. There are no reports of uncontrolled pain; no reports of agitation or anxiety. She continues to be followed by hospice care.   Past Medical History:  Diagnosis Date  . Thyroid disease     Past Surgical History:  Procedure Laterality Date  . ABDOMINAL HYSTERECTOMY    . TONSILLECTOMY    . TOTAL HIP ARTHROPLASTY     left    Social History   Socioeconomic History  . Marital status: Widowed    Spouse name: Not on file  . Number of children: Not on file  . Years of education: Not on file  . Highest education level: Not on file  Occupational History  . Occupation: retired   Tobacco Use  . Smoking status: Never Smoker  . Smokeless tobacco: Never Used  Vaping Use  . Vaping Use: Never used  Substance and Sexual Activity  . Alcohol use: No  . Drug use: No  . Sexual activity: Not Currently  Other Topics Concern  . Not on file  Social History Narrative   Long term resident of [facility] .   Social Determinants of Health   Financial Resource Strain:   . Difficulty of Paying Living Expenses: Not on file  Food Insecurity:   . Worried About Charity fundraiser in the Last Year: Not on file  . Ran Out of Food in the Last Year: Not on file  Transportation Needs:   . Lack of Transportation (Medical): Not on file  . Lack of Transportation (Non-Medical): Not on file  Physical Activity:   . Days of Exercise per Week: Not on file  . Minutes of Exercise per Session: Not on file  Stress:   . Feeling of  Stress : Not on file  Social Connections:   . Frequency of Communication with Friends and Family: Not on file  . Frequency of Social Gatherings with Friends and Family: Not on file  . Attends Religious Services: Not on file  . Active Member of Clubs or Organizations: Not on file  . Attends Archivist Meetings: Not on file  . Marital Status: Not on file  Intimate Partner Violence:   . Fear of Current or Ex-Partner: Not on file  . Emotionally Abused: Not on file  . Physically Abused: Not on file  . Sexually Abused: Not on file   Family History  Problem Relation Age of Onset  . Heart attack Mother   . Heart attack Father   . Hypertension Father       VITAL SIGNS BP (!) 115/54   Pulse 92   Temp 99.4 F (37.4 C) (Oral)   Ht 5' 1"  (1.549 m)   Wt 117 lb 3.2 oz (53.2 kg)   SpO2 90%   BMI 22.14 kg/m   Outpatient Encounter Medications as of 12/06/2019  Medication Sig  . acetaminophen (TYLENOL) 650 MG CR tablet Take 650 mg by mouth  every 6 (six) hours.  Roseanne Kaufman [place]-Castor Oil [facility]) OINT Special Instructions: Apply to sacrum and bilateral buttocks qshift for prevention. Every Shift Day, Evening, Night  . collagenase (SANTYL) ointment Apply 1 application topically daily. Special Instructions: Apply to right heel wound per tx order daily.  Marland Kitchen docusate sodium (COLACE) 100 MG capsule Take 100 mg by mouth 2 (two) times daily.  . feeding supplement, ENSURE ENLIVE, (ENSURE ENLIVE) LIQD Take 237 mLs by mouth with breakfast, with lunch, and with evening meal.   . levothyroxine (SYNTHROID) 100 MCG tablet Take 100 mcg by mouth daily before breakfast.  . loratadine (CLARITIN) 10 MG tablet Take 10 mg by mouth daily. Chronic Allergies  . NON FORMULARY Diet: _____ Regular,  ___x___ NAS,  _______Consistent Carbohydrate,  _______NPO  _____Other  . oxyCODONE (OXY IR/ROXICODONE) 5 MG immediate release tablet Take 1 tablet (5 mg total) by mouth every 8 (eight) hours.   No  facility-administered encounter medications on file as of 12/06/2019.     SIGNIFICANT DIAGNOSTIC EXAMS   PREVIOUS  07-04-19: acute abdomen x-ray: No evidence of bowel obstruction or free air. Cardiomegaly  No acute cardiopulmonary disease.  08-23-19: chest x-ray: No acute bone abnormality. No osteomyelitis is noted.  NO NEW EXAMS.    LABS REVIEWED PREVIOUS  06-10-19: wbc 5.9; hgb 11.6; hct 37.2; mcv 102.8 plt 237 glucose 91; bun 50; creat 1.05 ;k+ 4.4; na++ 141; ca 9.1 liver normal albumin 3.5 07-04-19: wbc 10.4; hgb 11.5; hct 36.6; mcv 103.1 plt 214; glucose 190; bun 97; creat 2.41; k+ 5.3; na++ 141; ca 9.0 ast 42; alt 65; alk phos 158; albumin 3.2   07-04-19: vit B 12: 1014; iron 23;tibc 260 tsh 1.382 free T4: 1.66 urine culture: e-coli  07-15-19: glucose 102; bun 71; creat 1.45; k+ 4.7; na++ 139; ca 9.0  07-26-19: tsh 2.447 10-31-19: wbc 7.6; hg 11.8; hct 38.5; mcv 104.6 plt 311; glucose 156; bun 68; creat 1.31; k+ 4.0; na++ 139; ca 9.2  NO NEW LABS.   Review of Systems  Constitutional: Negative for malaise/fatigue.  Respiratory: Negative for cough and shortness of breath.   Cardiovascular: Negative for chest pain, palpitations and leg swelling.  Gastrointestinal: Negative for abdominal pain, constipation and heartburn.  Musculoskeletal: Negative for back pain, joint pain and myalgias.  Skin: Negative.   Neurological: Negative for dizziness.  Psychiatric/Behavioral: The patient is not nervous/anxious.    Physical Exam Constitutional:      General: She is not in acute distress.    Appearance: She is well-developed. She is not diaphoretic.  Neck:     Thyroid: No thyromegaly.  Cardiovascular:     Rate and Rhythm: Normal rate and regular rhythm.     Heart sounds: Normal heart sounds.     Comments: Unable to palpate pedal or post tib pulses  Pulmonary:     Effort: Pulmonary effort is normal. No respiratory distress.     Breath sounds: Normal breath sounds.  Abdominal:      General: Bowel sounds are normal. There is no distension.     Palpations: Abdomen is soft.     Tenderness: There is no abdominal tenderness.  Musculoskeletal:        General: Normal range of motion.     Cervical back: Neck supple.     Right lower leg: No edema.     Left lower leg: No edema.  Lymphadenopathy:     Cervical: No cervical adenopathy.  Skin:    General: Skin is warm and dry.  Comments:  Right heel stage 3: 6 x 4 x 0.1cm  Neurological:     Mental Status: She is alert. Mental status is at baseline.  Psychiatric:        Mood and Affect: Mood normal.       ASSESSMENT/ PLAN:  TODAY  1. Chronic generalized pain:   Is stable will continue oxycodone 5 mg every 8 hours will monitor   MD is aware of resident's narcotic use and is in agreement with current plan of care. We will attempt to wean resident as appropriate.  Ok Edwards NP [facility] Adult [facility]  Contact [phone] Monday through Friday 8am- 5pm  After hours call [phone]

## 2019-12-10 ENCOUNTER — Non-Acute Institutional Stay (SKILLED_NURSING_FACILITY): Payer: Medicare Other | Admitting: Adult Health

## 2019-12-10 ENCOUNTER — Encounter: Payer: Self-pay | Admitting: Adult Health

## 2019-12-10 ENCOUNTER — Other Ambulatory Visit: Payer: Self-pay | Admitting: Adult Health

## 2019-12-10 DIAGNOSIS — N1832 Chronic kidney disease, stage 3b: Secondary | ICD-10-CM

## 2019-12-10 DIAGNOSIS — E876 Hypokalemia: Secondary | ICD-10-CM

## 2019-12-10 DIAGNOSIS — K5909 Other constipation: Secondary | ICD-10-CM

## 2019-12-10 MED ORDER — OXYCODONE HCL 5 MG PO TABS: 5.0000 mg | ORAL_TABLET | Freq: Three times a day (TID) | ORAL | 0 refills | Status: DC

## 2019-12-10 NOTE — Progress Notes (Signed)
Location:    [place] Room Number: 145/W Place of Service:  SNF (31)   CODE STATUS: DNR  Allergies  Allergen Reactions   Cephalexin Other (See Comments)    Pt. Doesn't remember.    Cephalexin Other (See Comments)    Pt. Doesn't remember.    Penicillins Other (See Comments)    Pt. Cant remember.     Chief Complaint  Patient presents with   Medical Management of Chronic Issues          Chronic constipation:    Hypokalemia:   CKD stage 3b     HPI:  She is a 86 year old long resident of this facility being seen for the management of her chronic illnesses: Chronic constipation:    Hypokalemia:   CKD stage 3b. There are no reports of agitation or anxiety present. She continues to slowly lose weight over time. There are no reports of constipation.   Past Medical History:  Diagnosis Date   Thyroid disease     Past Surgical History:  Procedure Laterality Date   ABDOMINAL HYSTERECTOMY     TONSILLECTOMY     TOTAL HIP ARTHROPLASTY     left    Social History   Socioeconomic History   Marital status: Widowed    Spouse name: Not on file   Number of children: Not on file   Years of education: Not on file   Highest education level: Not on file  Occupational History   Occupation: retired   Tobacco Use   Smoking status: Never Smoker   Smokeless tobacco: Never Used  Scientific laboratory technician Use: Never used  Substance and Sexual Activity   Alcohol use: No   Drug use: No   Sexual activity: Not Currently  Other Topics Concern   Not on file  Social History Narrative   Long term resident of [facility] .   Social Determinants of Health   Financial Resource Strain:    Difficulty of Paying Living Expenses: Not on file  Food Insecurity:    Worried About Charity fundraiser in the Last Year: Not on file   [employer] of Food in the Last Year: Not on file  Transportation Needs:    Lack of Transportation (Medical): Not on file   Lack of  Transportation (Non-Medical): Not on file  Physical Activity:    Days of Exercise per Week: Not on file   Minutes of Exercise per Session: Not on file  Stress:    Feeling of Stress : Not on file  Social Connections:    Frequency of Communication with Friends and Family: Not on file   Frequency of Social Gatherings with Friends and Family: Not on file   Attends Religious Services: Not on file   Active Member of Clubs or Organizations: Not on file   Attends Archivist Meetings: Not on file   Marital Status: Not on file  Intimate Partner Violence:    Fear of Current or Ex-Partner: Not on file   Emotionally Abused: Not on file   Physically Abused: Not on file   Sexually Abused: Not on file   Family History  Problem Relation Age of Onset   Heart attack Mother    Heart attack Father    Hypertension Father       VITAL SIGNS BP 117/63    Pulse 85    Temp 98.7 F (37.1 C) (Oral)    Resp 18    Ht  5' 1"  (1.549 m)    Wt 117 lb 3.2 oz (53.2 kg)    SpO2 90%    BMI 22.14 kg/m   Outpatient Encounter Medications as of 12/10/2019  Medication Sig   acetaminophen (TYLENOL) 650 MG CR tablet Take 650 mg by mouth every 6 (six) hours.   azithromycin (ZITHROMAX) 500 MG tablet Take 500 mg by mouth daily. For right foot cellulitis   Balsam [place]-Castor Oil (VENELEX) OINT Special Instructions: Apply to sacrum and bilateral buttocks qshift for prevention. Every Shift Day, Evening, Night   docusate sodium (COLACE) 100 MG capsule Take 100 mg by mouth 2 (two) times daily.   feeding supplement, ENSURE ENLIVE, (ENSURE ENLIVE) LIQD Take 237 mLs by mouth with breakfast, with lunch, and with evening meal.    levothyroxine (SYNTHROID) 100 MCG tablet Take 100 mcg by mouth daily before breakfast.   loratadine (CLARITIN) 10 MG tablet Take 10 mg by mouth daily. Chronic Allergies   NON FORMULARY Diet: _____ Regular,  ___x___ NAS,  _______Consistent Carbohydrate,  _______NPO    _____Other   oxyCODONE (OXY IR/ROXICODONE) 5 MG immediate release tablet Take 1 tablet (5 mg total) by mouth every 8 (eight) hours.   [DISCONTINUED] collagenase (SANTYL) ointment Apply 1 application topically daily. Special Instructions: Apply to right heel wound per tx order daily.   No facility-administered encounter medications on file as of 12/10/2019.     SIGNIFICANT DIAGNOSTIC EXAMS   PREVIOUS  07-04-19: acute abdomen x-ray: No evidence of bowel obstruction or free air. Cardiomegaly  No acute cardiopulmonary disease.  TODAY  08-23-19: chest x-ray: No acute bone abnormality. No osteomyelitis is noted.   LABS REVIEWED PREVIOUS  06-10-19: wbc 5.9; hgb 11.6; hct 37.2; mcv 102.8 plt 237 glucose 91; bun 50; creat 1.05 ;k+ 4.4; na++ 141; ca 9.1 liver normal albumin 3.5 07-04-19: wbc 10.4; hgb 11.5; hct 36.6; mcv 103.1 plt 214; glucose 190; bun 97; creat 2.41; k+ 5.3; na++ 141; ca 9.0 ast 42; alt 65; alk phos 158; albumin 3.2   07-04-19: vit B 12: 1014; iron 23;tibc 260 tsh 1.382 free T4: 1.66 urine culture: e-coli  07-15-19: glucose 102; bun 71; creat 1.45; k+ 4.7; na++ 139; ca 9.0  07-26-19: tsh 2.447 10-31-19: wbc 7.6; hg 11.8; hct 38.5; mcv 104.6 plt 311; glucose 156; bun 68; creat 1.31; k+ 4.0; na++ 139; ca 9.2  NO NEW LABS.    Review of Systems  Constitutional: Negative for malaise/fatigue.  Respiratory: Negative for cough and shortness of breath.   Cardiovascular: Negative for chest pain, palpitations and leg swelling.  Gastrointestinal: Negative for abdominal pain, constipation and heartburn.  Musculoskeletal: Negative for back pain, joint pain and myalgias.  Skin: Negative.   Neurological: Negative for dizziness.  Psychiatric/Behavioral: The patient is not nervous/anxious.       Physical Exam Constitutional:      General: She is not in acute distress.    Appearance: She is well-developed. She is not diaphoretic.  Neck:     Thyroid: No thyromegaly.  Cardiovascular:      Rate and Rhythm: Normal rate and regular rhythm.     Heart sounds: Normal heart sounds.     Comments: Unable to palpate pedal or post tib pulses  Pulmonary:     Effort: Pulmonary effort is normal. No respiratory distress.     Breath sounds: Normal breath sounds.  Abdominal:     General: Bowel sounds are normal. There is no distension.     Palpations: Abdomen is soft.  Tenderness: There is no abdominal tenderness.  Musculoskeletal:        General: Normal range of motion.     Cervical back: Neck supple.  Lymphadenopathy:     Cervical: No cervical adenopathy.  Skin:    General: Skin is warm and dry.     Comments: Stage 3 right heel: 5.6 x 3.8 x 0.1 cm   Neurological:     Mental Status: She is alert. Mental status is at baseline.  Psychiatric:        Mood and Affect: Mood normal.         ASSESSMENT/ PLAN:  TODAY  1. Chronic constipation: is stable will continue colace twice daily   2. Hypokalemia: is stable k+ 4.0 will monitor   3. CKD stage 3b without change bun 68; creat 1.37 will continue to monitor   PREVIOUS  4. Advanced dementia: is stable weight is 117 pounds will monitor unfortunately weight loss in the late stages of this disease process is expected.   5. Chronic generalized pain: is stable will continue oxycodone 5 mg every 8 hours.   6. Failure to thrive in adult: is stable weight is 117 pounds will continue supplements as directed; unable to tolerate remeron  7. Urinary incontinence in female: is off medication will monitor   8. Acquired hypothyroidism: is stable tsh 2.447 will continue synthroid 100 mcg daily     MD is aware of resident's narcotic use and is in agreement with current plan of care. We will attempt to wean resident as appropriate.  Ok Edwards NP [facility] Adult Medicine  Contact [phone] Monday through Friday 8am- 5pm  After hours call [phone]

## 2019-12-12 ENCOUNTER — Non-Acute Institutional Stay (SKILLED_NURSING_FACILITY): Payer: Medicare Other | Admitting: Adult Health

## 2019-12-12 DIAGNOSIS — L89613 Pressure ulcer of right heel, stage 3: Secondary | ICD-10-CM

## 2019-12-12 DIAGNOSIS — Z1159 Encounter for screening for other viral diseases: Secondary | ICD-10-CM | POA: Diagnosis not present

## 2019-12-12 DIAGNOSIS — R52 Pain, unspecified: Secondary | ICD-10-CM | POA: Diagnosis not present

## 2019-12-12 DIAGNOSIS — G8929 Other chronic pain: Secondary | ICD-10-CM | POA: Diagnosis not present

## 2019-12-12 DIAGNOSIS — D649 Anemia, unspecified: Secondary | ICD-10-CM | POA: Diagnosis not present

## 2019-12-13 ENCOUNTER — Encounter: Payer: Self-pay | Admitting: Adult Health

## 2019-12-13 NOTE — Progress Notes (Signed)
 Location:   [facility] Room Number: 222 Place of Service:  SNF (31)   CODE STATUS: dnr  Allergies  Allergen Reactions  . Cephalexin Other (See Comments)    Pt. Doesn't remember.   . Cephalexin Other (See Comments)    Pt. Doesn't remember.   . Penicillins Other (See Comments)    Pt. Cant remember.     Chief Complaint  Patient presents with  . Acute Visit    pain management     HPI:  She is presently taking oxycodone 5 mg three times daily for pain management. She is having bilateral lower extremity pain without relief of her pain. Hospice nurse would like to increase her oxycodone back to every 6 hours for pain management. There are no reports of insomnia. She is slowly losing weight.   Past Medical History:  Diagnosis Date  . Thyroid disease     Past Surgical History:  Procedure Laterality Date  . ABDOMINAL HYSTERECTOMY    . TONSILLECTOMY    . TOTAL HIP ARTHROPLASTY     left    Social History   Socioeconomic History  . Marital status: Widowed    Spouse name: Not on file  . Number of children: Not on file  . Years of education: Not on file  . Highest education level: Not on file  Occupational History  . Occupation: retired   Tobacco Use  . Smoking status: Never Smoker  . Smokeless tobacco: Never Used  Vaping Use  . Vaping Use: Never used  Substance and Sexual Activity  . Alcohol use: No  . Drug use: No  . Sexual activity: Not Currently  Other Topics Concern  . Not on file  Social History Narrative   Long term resident of [facility] .   Social Determinants of Health   Financial Resource Strain:   . Difficulty of Paying Living Expenses: Not on file  Food Insecurity:   . Worried About Running Out of Food in the Last Year: Not on file  . Ran Out of Food in the Last Year: Not on file  Transportation Needs:   . Lack of Transportation (Medical): Not on file  . Lack of Transportation (Non-Medical): Not on file  Physical Activity:   . Days of  Exercise per Week: Not on file  . Minutes of Exercise per Session: Not on file  Stress:   . Feeling of Stress : Not on file  Social Connections:   . Frequency of Communication with Friends and Family: Not on file  . Frequency of Social Gatherings with Friends and Family: Not on file  . Attends Religious Services: Not on file  . Active Member of Clubs or Organizations: Not on file  . Attends Club or Organization Meetings: Not on file  . Marital Status: Not on file  Intimate Partner Violence:   . Fear of Current or Ex-Partner: Not on file  . Emotionally Abused: Not on file  . Physically Abused: Not on file  . Sexually Abused: Not on file   Family History  Problem Relation Age of Onset  . Heart attack Mother   . Heart attack Father   . Hypertension Father       VITAL SIGNS BP (!) 117/56   Pulse 81   Temp 97.8 F (36.6 C)   Ht 5' 1" (1.549 m)   Wt 117 lb (53.1 kg)   BMI 22.11 kg/m   Outpatient Encounter Medications as of 12/12/2019  Medication Sig  .   acetaminophen (TYLENOL) 650 MG CR tablet Take 650 mg by mouth every 6 (six) hours.  . azithromycin (ZITHROMAX) 500 MG tablet Take 500 mg by mouth daily. For right foot cellulitis  . Balsam Peru-Castor Oil (VENELEX) OINT Special Instructions: Apply to sacrum and bilateral buttocks qshift for prevention. Every Shift Day, Evening, Night  . docusate sodium (COLACE) 100 MG capsule Take 100 mg by mouth 2 (two) times daily.  . feeding supplement, ENSURE ENLIVE, (ENSURE ENLIVE) LIQD Take 237 mLs by mouth with breakfast, with lunch, and with evening meal.   . levothyroxine (SYNTHROID) 100 MCG tablet Take 100 mcg by mouth daily before breakfast.  . loratadine (CLARITIN) 10 MG tablet Take 10 mg by mouth daily. Chronic Allergies  . NON FORMULARY Diet: _____ Regular,  ___x___ NAS,  _______Consistent Carbohydrate,  _______NPO  _____Other  . oxyCODONE (OXY IR/ROXICODONE) 5 MG immediate release tablet Take 1 tablet (5 mg total) by mouth  every 8 (eight) hours.   No facility-administered encounter medications on file as of 12/12/2019.     SIGNIFICANT DIAGNOSTIC EXAMS   PREVIOUS  07-04-19: acute abdomen x-ray: No evidence of bowel obstruction or free air. Cardiomegaly  No acute cardiopulmonary disease.  08-23-19: chest x-ray: No acute bone abnormality. No osteomyelitis is noted.  NO NEW EXAMS.    LABS REVIEWED PREVIOUS  06-10-19: wbc 5.9; hgb 11.6; hct 37.2; mcv 102.8 plt 237 glucose 91; bun 50; creat 1.05 ;k+ 4.4; na++ 141; ca 9.1 liver normal albumin 3.5 07-04-19: wbc 10.4; hgb 11.5; hct 36.6; mcv 103.1 plt 214; glucose 190; bun 97; creat 2.41; k+ 5.3; na++ 141; ca 9.0 ast 42; alt 65; alk phos 158; albumin 3.2   07-04-19: vit B 12: 1014; iron 23;tibc 260 tsh 1.382 free T4: 1.66 urine culture: e-coli  07-15-19: glucose 102; bun 71; creat 1.45; k+ 4.7; na++ 139; ca 9.0  07-26-19: tsh 2.447 10-31-19: wbc 7.6; hg 11.8; hct 38.5; mcv 104.6 plt 311; glucose 156; bun 68; creat 1.31; k+ 4.0; na++ 139; ca 9.2  NO NEW LABS.   Review of Systems  Constitutional: Negative for malaise/fatigue.  Respiratory: Negative for cough and shortness of breath.   Cardiovascular: Negative for chest pain, palpitations and leg swelling.  Gastrointestinal: Negative for abdominal pain, constipation and heartburn.  Musculoskeletal: Positive for myalgias. Negative for back pain and joint pain.       Bilateral leg pain   Skin: Negative.   Neurological: Negative for dizziness.  Psychiatric/Behavioral: The patient is not nervous/anxious.      Physical Exam Constitutional:      General: She is not in acute distress.    Appearance: She is well-developed. She is not diaphoretic.  Neck:     Thyroid: No thyromegaly.  Cardiovascular:     Rate and Rhythm: Normal rate and regular rhythm.     Heart sounds: Normal heart sounds.     Comments: Unable to palpate pedal or post tib pulses  Pulmonary:     Effort: Pulmonary effort is normal. No respiratory  distress.     Breath sounds: Normal breath sounds.  Abdominal:     General: Bowel sounds are normal. There is no distension.     Palpations: Abdomen is soft.     Tenderness: There is no abdominal tenderness.  Musculoskeletal:        General: Normal range of motion.     Cervical back: Neck supple.     Right lower leg: No edema.     Left lower leg: No edema.    Lymphadenopathy:     Cervical: No cervical adenopathy.  Skin:    General: Skin is warm and dry.     Comments: Stage 3 right heel: 5.6 x 3.8 x 0.1 cm    Neurological:     Mental Status: She is alert. Mental status is at baseline.  Psychiatric:        Mood and Affect: Mood normal.       ASSESSMENT/ PLAN:  TODAY  1. Chronic generalized pain 2. Pressure ulcer right heel stage 3  Will change her to: oxycodone 5 mg every 6 hours to better manage her pain and improve upon her comfort.   MD is aware of resident's narcotic use and is in agreement with current plan of care. We will attempt to wean resident as appropriate.    NP [facility]  Contact [phone] Monday through Friday 8am- 5pm  After hours call [phone]   

## 2019-12-17 DIAGNOSIS — Z1159 Encounter for screening for other viral diseases: Secondary | ICD-10-CM | POA: Diagnosis not present

## 2019-12-17 DIAGNOSIS — D649 Anemia, unspecified: Secondary | ICD-10-CM | POA: Diagnosis not present

## 2019-12-18 ENCOUNTER — Other Ambulatory Visit: Payer: Self-pay | Admitting: Adult Health

## 2019-12-19 DIAGNOSIS — Z1159 Encounter for screening for other viral diseases: Secondary | ICD-10-CM | POA: Diagnosis not present

## 2019-12-19 DIAGNOSIS — D649 Anemia, unspecified: Secondary | ICD-10-CM | POA: Diagnosis not present

## 2019-12-23 DIAGNOSIS — D649 Anemia, unspecified: Secondary | ICD-10-CM | POA: Diagnosis not present

## 2019-12-23 DIAGNOSIS — R69 Illness, unspecified: Secondary | ICD-10-CM | POA: Diagnosis not present

## 2019-12-23 DIAGNOSIS — Z1159 Encounter for screening for other viral diseases: Secondary | ICD-10-CM | POA: Diagnosis not present

## 2019-12-26 DIAGNOSIS — D649 Anemia, unspecified: Secondary | ICD-10-CM | POA: Diagnosis not present

## 2019-12-26 DIAGNOSIS — Z1159 Encounter for screening for other viral diseases: Secondary | ICD-10-CM | POA: Diagnosis not present

## 2019-12-30 DIAGNOSIS — D649 Anemia, unspecified: Secondary | ICD-10-CM | POA: Diagnosis not present

## 2019-12-30 DIAGNOSIS — Z1159 Encounter for screening for other viral diseases: Secondary | ICD-10-CM | POA: Diagnosis not present

## 2020-01-09 ENCOUNTER — Non-Acute Institutional Stay (SKILLED_NURSING_FACILITY): Payer: Medicare Other | Admitting: Adult Health

## 2020-01-09 ENCOUNTER — Encounter: Payer: Self-pay | Admitting: Adult Health

## 2020-01-09 DIAGNOSIS — R627 Adult failure to thrive: Secondary | ICD-10-CM | POA: Diagnosis not present

## 2020-01-09 DIAGNOSIS — F0390 Unspecified dementia without behavioral disturbance: Secondary | ICD-10-CM | POA: Diagnosis not present

## 2020-01-09 DIAGNOSIS — G8929 Other chronic pain: Secondary | ICD-10-CM

## 2020-01-09 DIAGNOSIS — R52 Pain, unspecified: Secondary | ICD-10-CM | POA: Diagnosis not present

## 2020-01-09 DIAGNOSIS — F03C0 Unspecified dementia, severe, without behavioral disturbance, psychotic disturbance, mood disturbance, and anxiety: Secondary | ICD-10-CM

## 2020-01-09 NOTE — Progress Notes (Signed)
[facility] Room Number: 145/W Place of Service:  SNF (31)   CODE STATUS: DNR  Allergies  Allergen Reactions  . Cephalexin Other (See Comments)    Pt. Doesn't remember.   . Cephalexin Other (See Comments)    Pt. Doesn't remember.   . Penicillins Other (See Comments)    Pt. Cant remember.     Chief Complaint  Patient presents with  . Medical Management of Chronic Issues           Advanced dementia:  Chronic generalized pain:   Failure to thrive in adult:    HPI:  She is a 86 year old long term resident of this facility being seen for the management of her chronic illnesses:Advanced dementia:  Chronic generalized pain:   Failure to thrive in adult. She continues to be followed by hospice care. She did not tolerate her wean off oxycodone and is back on every 6 hours routinely. There are no reports of anxiety or agitation. Her weight is stable at this time.    Past Medical History:  Diagnosis Date  . Thyroid disease     Past Surgical History:  Procedure Laterality Date  . ABDOMINAL HYSTERECTOMY    . TONSILLECTOMY    . TOTAL HIP ARTHROPLASTY     left    Social History   Socioeconomic History  . Marital status: Widowed    Spouse name: Not on file  . Number of children: Not on file  . Years of education: Not on file  . Highest education level: Not on file  Occupational History  . Occupation: retired   Tobacco Use  . Smoking status: Never Smoker  . Smokeless tobacco: Never Used  Vaping Use  . Vaping Use: Never used  Substance and Sexual Activity  . Alcohol use: No  . Drug use: No  . Sexual activity: Not Currently  Other Topics Concern  . Not on file  Social History Narrative   Long term resident of [facility] .   Social Determinants of Health   Financial Resource Strain:   . Difficulty of Paying Living Expenses: Not on file  Food Insecurity:   . Worried About Charity fundraiser in the Last Year: Not on file  . Ran Out of Food  in the Last Year: Not on file  Transportation Needs:   . Lack of Transportation (Medical): Not on file  . Lack of Transportation (Non-Medical): Not on file  Physical Activity:   . Days of Exercise per Week: Not on file  . Minutes of Exercise per Session: Not on file  Stress:   . Feeling of Stress : Not on file  Social Connections:   . Frequency of Communication with Friends and Family: Not on file  . Frequency of Social Gatherings with Friends and Family: Not on file  . Attends Religious Services: Not on file  . Active Member of Clubs or Organizations: Not on file  . Attends Archivist Meetings: Not on file  . Marital Status: Not on file  Intimate Partner Violence:   . Fear of Current or Ex-Partner: Not on file  . Emotionally Abused: Not on file  . Physically Abused: Not on file  . Sexually Abused: Not on file   Family History  Problem Relation Age of Onset  . Heart attack Mother   . Heart attack Father   . Hypertension Father       VITAL SIGNS BP 128/80  Pulse 60   Temp 97.6 F (36.4 C)   Resp 18   Ht _0  (1.549 m)   Wt 121 lb 3.2 oz (55 kg)   BMI 22.90 kg/m   Outpatient Encounter Medications as of 01/09/2020  Medication Sig  . acetaminophen (TYLENOL) 650 MG CR tablet Take 650 mg by mouth every 6 (six) hours.  Roseanne Kaufman [place]-Castor Oil [facility]) OINT Special Instructions: Apply to sacrum and bilateral buttocks qshift for prevention. Every Shift Day, Evening, Night  . docusate sodium (COLACE) 100 MG capsule Take 100 mg by mouth 2 (two) times daily.  . feeding supplement, ENSURE ENLIVE, (ENSURE ENLIVE) LIQD Take 237 mLs by mouth with breakfast, with lunch, and with evening meal.   . levothyroxine (SYNTHROID) 100 MCG tablet Take 100 mcg by mouth daily before breakfast.  . loratadine (CLARITIN) 10 MG tablet Take 10 mg by mouth daily. Chronic Allergies  . NON FORMULARY Diet: _____ Regular,  ___x___ NAS,  _______Consistent Carbohydrate,  _______NPO    _____Other  . oxyCODONE (OXY IR/ROXICODONE) 5 MG immediate release tablet Take 5 mg by mouth every 6 (six) hours.  . [DISCONTINUED] azithromycin (ZITHROMAX) 500 MG tablet Take 500 mg by mouth daily. For right foot cellulitis  . [DISCONTINUED] oxyCODONE (OXY IR/ROXICODONE) 5 MG immediate release tablet Take 1 tablet (5 mg total) by mouth every 8 (eight) hours.   No facility-administered encounter medications on file as of 01/09/2020.     SIGNIFICANT DIAGNOSTIC EXAMS  PREVIOUS  07-04-19: acute abdomen x-ray: No evidence of bowel obstruction or free air. Cardiomegaly  No acute cardiopulmonary disease.  08-23-19: chest x-ray: No acute bone abnormality. No osteomyelitis is noted.  NO NEW EXAMS.    LABS REVIEWED PREVIOUS  06-10-19: wbc 5.9; hgb 11.6; hct 37.2; mcv 102.8 plt 237 glucose 91; bun 50; creat 1.05 ;k+ 4.4; na++ 141; ca 9.1 liver normal albumin 3.5 07-04-19: wbc 10.4; hgb 11.5; hct 36.6; mcv 103.1 plt 214; glucose 190; bun 97; creat 2.41; k+ 5.3; na++ 141; ca 9.0 ast 42; alt 65; alk phos 158; albumin 3.2   07-04-19: vit B 12: 1014; iron 23;tibc 260 tsh 1.382 free T4: 1.66 urine culture: e-coli  07-15-19: glucose 102; bun 71; creat 1.45; k+ 4.7; na++ 139; ca 9.0  07-26-19: tsh 2.447 10-31-19: wbc 7.6; hg 11.8; hct 38.5; mcv 104.6 plt 311; glucose 156; bun 68; creat 1.31; k+ 4.0; na++ 139; ca 9.2  NO NEW LABS.   Review of Systems  Constitutional: Negative for malaise/fatigue.  Respiratory: Negative for cough and shortness of breath.   Cardiovascular: Negative for chest pain, palpitations and leg swelling.  Gastrointestinal: Negative for abdominal pain, constipation and heartburn.  Musculoskeletal: Negative for back pain, joint pain and myalgias.  Skin: Negative.   Neurological: Negative for dizziness.  Psychiatric/Behavioral: The patient is not nervous/anxious.       Physical Exam Constitutional:      General: She is not in acute distress.    Appearance: She is well-developed.  She is not diaphoretic.  Neck:     Thyroid: No thyromegaly.  Cardiovascular:     Rate and Rhythm: Normal rate and regular rhythm.     Heart sounds: Normal heart sounds.     Comments: Unable to palpate pedal or post  Pulmonary:     Effort: Pulmonary effort is normal. No respiratory distress.     Breath sounds: Normal breath sounds.  Abdominal:     General: Bowel sounds are normal. There is no distension.  Palpations: Abdomen is soft.     Tenderness: There is no abdominal tenderness.  Musculoskeletal:        General: Normal range of motion.     Cervical back: Neck supple.     Right lower leg: No edema.     Left lower leg: No edema.  Lymphadenopathy:     Cervical: No cervical adenopathy.  Skin:    General: Skin is warm and dry.     Comments: Right heel stage 3: 5.8 x 3.8 cm   Neurological:     Mental Status: She is alert. Mental status is at baseline.  Psychiatric:        Mood and Affect: Mood normal.     ASSESSMENT/ PLAN:  TODAY  1. Advanced dementia: without significant change weight is 121 pounds; which is stable will continue to monitor her status.   2. Chronic generalized pain: is stable will continue oxycodone 5 mg every 6 hours routinely; has failed one wean.   3. Failure to thrive in adult: is without change weight is 121 pounds will continue supplements as directed did not tolerate remeron.   PREVIOUS  4. Urinary incontinence in female: is off medication will monitor   5. Acquired hypothyroidism: is stable tsh 2.447 will continue synthroid 100 mcg daily   6. Chronic constipation: is stable will continue colace twice daily   7. Hypokalemia: is stable k+ 4.0 will monitor   8. CKD stage 3b without change bun 68; creat 1.37 will continue to monitor        MD is aware of resident's narcotic use and is in agreement with current plan of care. We will attempt to wean resident as appropriate.  Ok Edwards NP [facility]  Contact [phone]  Monday through Friday 8am- 5pm  After hours call [phone]

## 2020-01-15 ENCOUNTER — Other Ambulatory Visit: Payer: Self-pay | Admitting: Adult Health

## 2020-01-18 ENCOUNTER — Other Ambulatory Visit: Payer: Self-pay | Admitting: Internal Medicine

## 2020-01-18 MED ORDER — OXYCODONE HCL 5 MG PO TABS: 5.0000 mg | ORAL_TABLET | Freq: Four times a day (QID) | ORAL | 0 refills | Status: DC

## 2020-01-23 ENCOUNTER — Encounter: Payer: Self-pay | Admitting: Adult Health

## 2020-01-23 ENCOUNTER — Non-Acute Institutional Stay (SKILLED_NURSING_FACILITY): Payer: Medicare Other | Admitting: Adult Health

## 2020-01-23 DIAGNOSIS — R627 Adult failure to thrive: Secondary | ICD-10-CM | POA: Diagnosis not present

## 2020-01-23 DIAGNOSIS — F0390 Unspecified dementia without behavioral disturbance: Secondary | ICD-10-CM | POA: Diagnosis not present

## 2020-01-23 DIAGNOSIS — F03C0 Unspecified dementia, severe, without behavioral disturbance, psychotic disturbance, mood disturbance, and anxiety: Secondary | ICD-10-CM

## 2020-01-23 DIAGNOSIS — L89613 Pressure ulcer of right heel, stage 3: Secondary | ICD-10-CM

## 2020-01-23 NOTE — Progress Notes (Signed)
Location:    [facility] Room Number: 145/W Place of Service:  SNF (31)   CODE STATUS: DNR  Allergies  Allergen Reactions  . Cephalexin Other (See Comments)    Pt. Doesn't remember.   . Cephalexin Other (See Comments)    Pt. Doesn't remember.   . Penicillins Other (See Comments)    Pt. Cant remember.     Chief Complaint  Patient presents with  . Acute Visit    Care Pan Meeting    HPI:  We have come together for her care plan meeting. Unable to do BIMS; mood 1/30. Family present. She has had one fall on 12-09-19 without injury. She is non-ambulatory. She requires extensive assist with her adls; is incontinent of bladder and bowel. Her weight is stable at 123 pounds has gained 2 pounds in the past quarter. She continues to be followed for her chronic illnesses including: Failure to thrive in adult Advanced dementia Pressure ulcer stage 3 right heel  Past Medical History:  Diagnosis Date  . Thyroid disease     Past Surgical History:  Procedure Laterality Date  . ABDOMINAL HYSTERECTOMY    . TONSILLECTOMY    . TOTAL HIP ARTHROPLASTY     left    Social History   Socioeconomic History  . Marital status: Widowed    Spouse name: Not on file  . Number of children: Not on file  . Years of education: Not on file  . Highest education level: Not on file  Occupational History  . Occupation: retired   Tobacco Use  . Smoking status: Never Smoker  . Smokeless tobacco: Never Used  Vaping Use  . Vaping Use: Never used  Substance and Sexual Activity  . Alcohol use: No  . Drug use: No  . Sexual activity: Not Currently  Other Topics Concern  . Not on file  Social History Narrative   Long term resident of [facility] .   Social Determinants of Health   Financial Resource Strain:   . Difficulty of Paying Living Expenses: Not on file  Food Insecurity:   . Worried About Charity fundraiser in the Last Year: Not on file  . Ran Out of Food in the Last Year: Not  on file  Transportation Needs:   . Lack of Transportation (Medical): Not on file  . Lack of Transportation (Non-Medical): Not on file  Physical Activity:   . Days of Exercise per Week: Not on file  . Minutes of Exercise per Session: Not on file  Stress:   . Feeling of Stress : Not on file  Social Connections:   . Frequency of Communication with Friends and Family: Not on file  . Frequency of Social Gatherings with Friends and Family: Not on file  . Attends Religious Services: Not on file  . Active Member of Clubs or Organizations: Not on file  . Attends Archivist Meetings: Not on file  . Marital Status: Not on file  Intimate Partner Violence:   . Fear of Current or Ex-Partner: Not on file  . Emotionally Abused: Not on file  . Physically Abused: Not on file  . Sexually Abused: Not on file   Family History  Problem Relation Age of Onset  . Heart attack Mother   . Heart attack Father   . Hypertension Father       VITAL SIGNS BP 127/71   Pulse 86   Temp 97.7 F (36.5 C)   Ht 5' 1"  (  1.549 m)   Wt 123 lb (55.8 kg)   BMI 23.24 kg/m   Outpatient Encounter Medications as of 01/23/2020  Medication Sig  . acetaminophen (TYLENOL) 650 MG CR tablet Take 650 mg by mouth every 6 (six) hours.  Roseanne Kaufman [place]-Castor Oil [facility]) OINT Special Instructions: Apply to sacrum and bilateral buttocks qshift for prevention. Every Shift Day, Evening, Night  . docusate sodium (COLACE) 100 MG capsule Take 100 mg by mouth 2 (two) times daily.  . feeding supplement, ENSURE ENLIVE, (ENSURE ENLIVE) LIQD Take 237 mLs by mouth with breakfast, with lunch, and with evening meal.   . levothyroxine (SYNTHROID) 100 MCG tablet Take 100 mcg by mouth daily before breakfast.  . loratadine (CLARITIN) 10 MG tablet Take 10 mg by mouth daily. Chronic Allergies  . NON FORMULARY Regular diet once a day  . oxyCODONE (OXY IR/ROXICODONE) 5 MG immediate release tablet Take 1 tablet (5 mg total) by mouth  every 6 (six) hours.  . silver nitrate applicators 15-17 % applicator Apply topically 3 (three) times a week. Apply silver nitrate stick to rolled borders of right heel wound daily prn epithelialization and rolled borders of wound   No facility-administered encounter medications on file as of 01/23/2020.     SIGNIFICANT DIAGNOSTIC EXAMS  PREVIOUS  07-04-19: acute abdomen x-ray: No evidence of bowel obstruction or free air. Cardiomegaly  No acute cardiopulmonary disease.  08-23-19: chest x-ray: No acute bone abnormality. No osteomyelitis is noted.  NO NEW EXAMS.    LABS REVIEWED PREVIOUS  06-10-19: wbc 5.9; hgb 11.6; hct 37.2; mcv 102.8 plt 237 glucose 91; bun 50; creat 1.05 ;k+ 4.4; na++ 141; ca 9.1 liver normal albumin 3.5 07-04-19: wbc 10.4; hgb 11.5; hct 36.6; mcv 103.1 plt 214; glucose 190; bun 97; creat 2.41; k+ 5.3; na++ 141; ca 9.0 ast 42; alt 65; alk phos 158; albumin 3.2   07-04-19: vit B 12: 1014; iron 23;tibc 260 tsh 1.382 free T4: 1.66 urine culture: e-coli  07-15-19: glucose 102; bun 71; creat 1.45; k+ 4.7; na++ 139; ca 9.0  07-26-19: tsh 2.447 10-31-19: wbc 7.6; hg 11.8; hct 38.5; mcv 104.6 plt 311; glucose 156; bun 68; creat 1.31; k+ 4.0; na++ 139; ca 9.2  Review of Systems  Constitutional: Negative for malaise/fatigue.  Respiratory: Negative for cough and shortness of breath.   Cardiovascular: Negative for chest pain, palpitations and leg swelling.  Gastrointestinal: Negative for abdominal pain, constipation and heartburn.  Musculoskeletal: Negative for back pain, joint pain and myalgias.  Skin: Negative.   Neurological: Negative for dizziness.  Psychiatric/Behavioral: The patient is not nervous/anxious.     Physical Exam Constitutional:      General: She is not in acute distress.    Appearance: She is well-developed. She is not diaphoretic.  Neck:     Thyroid: No thyromegaly.  Cardiovascular:     Rate and Rhythm: Normal rate and regular rhythm.     Heart sounds:  Normal heart sounds.     Comments: Unable to palpate pedal or post tibs Pulmonary:     Effort: Pulmonary effort is normal. No respiratory distress.     Breath sounds: Normal breath sounds.  Abdominal:     General: Bowel sounds are normal. There is no distension.     Palpations: Abdomen is soft.     Tenderness: There is no abdominal tenderness.  Musculoskeletal:        General: Normal range of motion.     Cervical back: Neck supple.  Right lower leg: No edema.     Left lower leg: No edema.  Lymphadenopathy:     Cervical: No cervical adenopathy.  Skin:    General: Skin is warm and dry.     Comments: Right heel stage 3: 5.6 x 3.4 cm   Neurological:     Mental Status: She is alert. Mental status is at baseline.  Psychiatric:        Mood and Affect: Mood normal.      ASSESSMENT/ PLAN:  TODAY  1. Failure to thrive in adult 2. Advanced dementia 3. Pressure ulcer stage 3 right heel  Will continue current medications Will continue current plan of care Will continue to monitor her status.   MD is aware of resident's narcotic use and is in agreement with current plan of care. We will attempt to wean resident as appropriate.  Ok Edwards NP [facility]  Contact [phone] Monday through Friday 8am- 5pm  After hours call [phone]

## 2020-01-27 ENCOUNTER — Other Ambulatory Visit: Payer: Self-pay | Admitting: Adult Health

## 2020-01-27 MED ORDER — OXYCODONE HCL 5 MG PO TABS: 5.0000 mg | ORAL_TABLET | Freq: Four times a day (QID) | ORAL | 0 refills | Status: DC

## 2020-02-03 ENCOUNTER — Other Ambulatory Visit: Payer: Self-pay | Admitting: Adult Health

## 2020-02-05 ENCOUNTER — Encounter: Payer: Self-pay | Admitting: Adult Health

## 2020-02-05 ENCOUNTER — Non-Acute Institutional Stay (SKILLED_NURSING_FACILITY): Payer: Medicare Other | Admitting: Adult Health

## 2020-02-05 DIAGNOSIS — E039 Hypothyroidism, unspecified: Secondary | ICD-10-CM

## 2020-02-05 DIAGNOSIS — L89613 Pressure ulcer of right heel, stage 3: Secondary | ICD-10-CM

## 2020-02-05 DIAGNOSIS — R32 Unspecified urinary incontinence: Secondary | ICD-10-CM

## 2020-02-05 NOTE — Progress Notes (Signed)
Location:    [address] Room Number: 145/W Place of Service:  SNF (31)   CODE STATUS: DNR  Allergies  Allergen Reactions  . Cephalexin Other (See Comments)    Pt. Doesn't remember.   . Cephalexin Other (See Comments)    Pt. Doesn't remember.   . Penicillins Other (See Comments)    Pt. Cant remember.     Chief Complaint  Patient presents with  . Medical Management of Chronic Issues          Pressure ulcer of right heel stage 3:  Acquired hypothyroidism:  Urinary incontinence in female:    HPI:  She is a 86 year old long term resident of this facility being seen for the management of her chronic illnesses: Pressure ulcer of right heel stage 3:  Acquired hypothyroidism:  Urinary incontinence in female. There are no reports of uncontrolled pain; she continues with oxycodone 5 mg every 6 hours. There are no reports of excessive fatigue. No reports of changes in appetite; no reports of agitation or anxiety.   Past Medical History:  Diagnosis Date  . Thyroid disease     Past Surgical History:  Procedure Laterality Date  . ABDOMINAL HYSTERECTOMY    . TONSILLECTOMY    . TOTAL HIP ARTHROPLASTY     left    Social History   Socioeconomic History  . Marital status: Widowed    Spouse name: Not on file  . Number of children: Not on file  . Years of education: Not on file  . Highest education level: Not on file  Occupational History  . Occupation: retired   Tobacco Use  . Smoking status: Never Smoker  . Smokeless tobacco: Never Used  Vaping Use  . Vaping Use: Never used  Substance and Sexual Activity  . Alcohol use: No  . Drug use: No  . Sexual activity: Not Currently  Other Topics Concern  . Not on file  Social History Narrative   Long term resident of [facility] .   Social Determinants of Health   Financial Resource Strain:   . Difficulty of Paying Living Expenses: Not on file  Food Insecurity:   . Worried About Charity fundraiser in the Last  Year: Not on file  . Ran Out of Food in the Last Year: Not on file  Transportation Needs:   . Lack of Transportation (Medical): Not on file  . Lack of Transportation (Non-Medical): Not on file  Physical Activity:   . Days of Exercise per Week: Not on file  . Minutes of Exercise per Session: Not on file  Stress:   . Feeling of Stress : Not on file  Social Connections:   . Frequency of Communication with Friends and Family: Not on file  . Frequency of Social Gatherings with Friends and Family: Not on file  . Attends Religious Services: Not on file  . Active Member of Clubs or Organizations: Not on file  . Attends Archivist Meetings: Not on file  . Marital Status: Not on file  Intimate Partner Violence:   . Fear of Current or Ex-Partner: Not on file  . Emotionally Abused: Not on file  . Physically Abused: Not on file  . Sexually Abused: Not on file   Family History  Problem Relation Age of Onset  . Heart attack Mother   . Heart attack Father   . Hypertension Father       VITAL SIGNS BP 130/77   Pulse  72   Temp (!) 97.4 F (36.3 C)   Resp 20   Ht 5' 1"  (1.549 m)   Wt 123 lb (55.8 kg)   BMI 23.24 kg/m   Outpatient Encounter Medications as of 02/05/2020  Medication Sig  . acetaminophen (TYLENOL) 650 MG CR tablet Take 650 mg by mouth every 6 (six) hours.  Roseanne Kaufman [employer] [facility]) OINT Special Instructions: Apply to sacrum and bilateral buttocks qshift for prevention. Every Shift Day, Evening, Night  . docusate sodium (COLACE) 100 MG capsule Take 100 mg by mouth 2 (two) times daily.  . feeding supplement, ENSURE ENLIVE, (ENSURE ENLIVE) LIQD Take 237 mLs by mouth with breakfast, with lunch, and with evening meal.   . levothyroxine (SYNTHROID) 100 MCG tablet Take 100 mcg by mouth daily before breakfast.  . loratadine (CLARITIN) 10 MG tablet Take 10 mg by mouth daily. Chronic Allergies  . NON FORMULARY Regular diet once a day  . oxyCODONE (OXY  IR/ROXICODONE) 5 MG immediate release tablet Take 1 tablet (5 mg total) by mouth every 6 (six) hours.  . silver nitrate applicators 91-50 % applicator Apply topically 3 (three) times a week. Apply silver nitrate stick to rolled borders of right heel wound daily prn epithelialization and rolled borders of wound   No facility-administered encounter medications on file as of 02/05/2020.     SIGNIFICANT DIAGNOSTIC EXAMS   PREVIOUS  07-04-19: acute abdomen x-ray: No evidence of bowel obstruction or free air. Cardiomegaly  No acute cardiopulmonary disease.  08-23-19: chest x-ray: No acute bone abnormality. No osteomyelitis is noted.  NO NEW EXAMS.    LABS REVIEWED PREVIOUS  06-10-19: wbc 5.9; hgb 11.6; hct 37.2; mcv 102.8 plt 237 glucose 91; bun 50; creat 1.05 ;k+ 4.4; na++ 141; ca 9.1 liver normal albumin 3.5 07-04-19: wbc 10.4; hgb 11.5; hct 36.6; mcv 103.1 plt 214; glucose 190; bun 97; creat 2.41; k+ 5.3; na++ 141; ca 9.0 ast 42; alt 65; alk phos 158; albumin 3.2   07-04-19: vit B 12: 1014; iron 23;tibc 260 tsh 1.382 free T4: 1.66 urine culture: e-coli  07-15-19: glucose 102; bun 71; creat 1.45; k+ 4.7; na++ 139; ca 9.0  07-26-19: tsh 2.447 10-31-19: wbc 7.6; hg 11.8; hct 38.5; mcv 104.6 plt 311; glucose 156; bun 68; creat 1.31; k+ 4.0; na++ 139; ca 9.2  NO NEW LABS.   Review of Systems  Constitutional: Negative for malaise/fatigue.  Respiratory: Negative for cough and shortness of breath.   Cardiovascular: Negative for chest pain, palpitations and leg swelling.  Gastrointestinal: Negative for abdominal pain, constipation and heartburn.  Musculoskeletal: Negative for back pain, joint pain and myalgias.  Skin: Negative.   Neurological: Negative for dizziness.  Psychiatric/Behavioral: The patient is not nervous/anxious.      Physical Exam Constitutional:      General: She is not in acute distress.    Appearance: She is well-developed. She is not diaphoretic.  Neck:     Thyroid: No  thyromegaly.  Cardiovascular:     Rate and Rhythm: Normal rate and regular rhythm.     Heart sounds: Normal heart sounds.     Comments: Unable to palpate pedal or post tibs Pulmonary:     Effort: Pulmonary effort is normal. No respiratory distress.     Breath sounds: Normal breath sounds.  Abdominal:     General: Bowel sounds are normal. There is no distension.     Palpations: Abdomen is soft.     Tenderness: There is no abdominal tenderness.  Musculoskeletal:        General: Normal range of motion.     Cervical back: Neck supple.  Lymphadenopathy:     Cervical: No cervical adenopathy.  Skin:    General: Skin is warm and dry.     Comments: Right heel stage 3: 4 x 2.6 cm   Neurological:     Mental Status: She is alert. Mental status is at baseline.  Psychiatric:        Mood and Affect: Mood normal.     ASSESSMENT/ PLAN:  TODAY  1. Pressure ulcer of right heel stage 3: is stable will continue current wound care and will monitor her status. Will continue supplements as directed   2. Acquired hypothyroidism: is stable tsh 2.447; will continue synthroid 100 mcg daily   3. Urinary incontinence in female: is off medications will monitor   PREVIOUS  4. Chronic constipation: is stable will continue colace twice daily   5. Hypokalemia: is stable k+ 4.0 will monitor   6. CKD stage 3b without change bun 68; creat 1.37 will continue to monitor   7. Advanced dementia: without significant change weight is 123 pounds; which is stable will continue to monitor her status.   8. Chronic generalized pain: is stable will continue oxycodone 5 mg every 6 hours routinely; has failed one wean.   9. Failure to thrive in adult: is without change weight is 123 pounds will continue supplements as directed did not tolerate remeron.      MD is aware of resident's narcotic use and is in agreement with current plan of care. We will attempt to wean resident as appropriate.  Ok Edwards  NP [facility]  Contact [phone] Monday through Friday 8am- 5pm  After hours call [phone]

## 2020-02-06 ENCOUNTER — Non-Acute Institutional Stay (SKILLED_NURSING_FACILITY): Payer: Medicare Other | Admitting: Adult Health

## 2020-02-06 ENCOUNTER — Encounter: Payer: Self-pay | Admitting: Adult Health

## 2020-02-06 DIAGNOSIS — L03213 Periorbital cellulitis: Secondary | ICD-10-CM

## 2020-02-06 NOTE — Progress Notes (Signed)
Location:    [place] Room Number: 145/W Place of Service:  SNF (31)   CODE STATUS: DNR  Allergies  Allergen Reactions  . Cephalexin Other (See Comments)    Pt. Doesn't remember.   . Cephalexin Other (See Comments)    Pt. Doesn't remember.   . Penicillins Other (See Comments)    Pt. Cant remember.     Chief Complaint  Patient presents with  . Acute Visit    Periorbital Redness    HPI:  She has had periorbital redness for the past several days. She does tell me that her eyes hurt. There are no reports of of fevers present. She had a short burst of prednisone to help with the inflammation; however; it has not improved. She continues to be followed by hospice care.   Past Medical History:  Diagnosis Date  . Thyroid disease     Past Surgical History:  Procedure Laterality Date  . ABDOMINAL HYSTERECTOMY    . TONSILLECTOMY    . TOTAL HIP ARTHROPLASTY     left    Social History   Socioeconomic History  . Marital status: Widowed    Spouse name: Not on file  . Number of children: Not on file  . Years of education: Not on file  . Highest education level: Not on file  Occupational History  . Occupation: retired   Tobacco Use  . Smoking status: Never Smoker  . Smokeless tobacco: Never Used  Vaping Use  . Vaping Use: Never used  Substance and Sexual Activity  . Alcohol use: No  . Drug use: No  . Sexual activity: Not Currently  Other Topics Concern  . Not on file  Social History Narrative   Long term resident of [facility] .   Social Determinants of Health   Financial Resource Strain:   . Difficulty of Paying Living Expenses: Not on file  Food Insecurity:   . Worried About Charity fundraiser in the Last Year: Not on file  . Ran Out of Food in the Last Year: Not on file  Transportation Needs:   . Lack of Transportation (Medical): Not on file  . Lack of Transportation (Non-Medical): Not on file  Physical Activity:   . Days of Exercise per  Week: Not on file  . Minutes of Exercise per Session: Not on file  Stress:   . Feeling of Stress : Not on file  Social Connections:   . Frequency of Communication with Friends and Family: Not on file  . Frequency of Social Gatherings with Friends and Family: Not on file  . Attends Religious Services: Not on file  . Active Member of Clubs or Organizations: Not on file  . Attends Archivist Meetings: Not on file  . Marital Status: Not on file  Intimate Partner Violence:   . Fear of Current or Ex-Partner: Not on file  . Emotionally Abused: Not on file  . Physically Abused: Not on file  . Sexually Abused: Not on file   Family History  Problem Relation Age of Onset  . Heart attack Mother   . Heart attack Father   . Hypertension Father       VITAL SIGNS BP 130/77   Pulse 72   Temp (!) 96.2 F (35.7 C)   Resp 20   Ht 5' 1"  (1.549 m)   Wt 123 lb (55.8 kg)   BMI 23.24 kg/m   Outpatient Encounter Medications as of 02/06/2020  Medication  Sig  . acetaminophen (TYLENOL) 650 MG CR tablet Take 650 mg by mouth every 6 (six) hours.  Roseanne Kaufman [employer] [facility]) OINT Special Instructions: Apply to sacrum and bilateral buttocks qshift for prevention. Every Shift Day, Evening, Night  . docusate sodium (COLACE) 100 MG capsule Take 100 mg by mouth 2 (two) times daily.  . feeding supplement, ENSURE ENLIVE, (ENSURE ENLIVE) LIQD Take 237 mLs by mouth with breakfast, with lunch, and with evening meal.   . levothyroxine (SYNTHROID) 100 MCG tablet Take 100 mcg by mouth daily before breakfast.  . loratadine (CLARITIN) 10 MG tablet Take 10 mg by mouth daily. Chronic Allergies  . NON FORMULARY Regular diet once a day  . oxyCODONE (OXY IR/ROXICODONE) 5 MG immediate release tablet Take 1 tablet (5 mg total) by mouth every 6 (six) hours.  . silver nitrate applicators 28-76 % applicator Apply 1 application topically daily as needed. Special Instructions: Apply silver nitrate stick to  rolled borders of right heel wound daily prn epithelialization and rolled borders of wound. Once A Day - PRN PRN 1   No facility-administered encounter medications on file as of 02/06/2020.     SIGNIFICANT DIAGNOSTIC EXAMS   PREVIOUS  07-04-19: acute abdomen x-ray: No evidence of bowel obstruction or free air. Cardiomegaly  No acute cardiopulmonary disease.  08-23-19: chest x-ray: No acute bone abnormality. No osteomyelitis is noted.  NO NEW EXAMS.    LABS REVIEWED PREVIOUS  06-10-19: wbc 5.9; hgb 11.6; hct 37.2; mcv 102.8 plt 237 glucose 91; bun 50; creat 1.05 ;k+ 4.4; na++ 141; ca 9.1 liver normal albumin 3.5 07-04-19: wbc 10.4; hgb 11.5; hct 36.6; mcv 103.1 plt 214; glucose 190; bun 97; creat 2.41; k+ 5.3; na++ 141; ca 9.0 ast 42; alt 65; alk phos 158; albumin 3.2   07-04-19: vit B 12: 1014; iron 23;tibc 260 tsh 1.382 free T4: 1.66 urine culture: e-coli  07-15-19: glucose 102; bun 71; creat 1.45; k+ 4.7; na++ 139; ca 9.0  07-26-19: tsh 2.447 10-31-19: wbc 7.6; hg 11.8; hct 38.5; mcv 104.6 plt 311; glucose 156; bun 68; creat 1.31; k+ 4.0; na++ 139; ca 9.2  NO NEW LABS.   Review of Systems  Constitutional: Negative for malaise/fatigue.  Eyes: Positive for pain and discharge.  Respiratory: Negative for cough and shortness of breath.   Cardiovascular: Negative for chest pain, palpitations and leg swelling.  Gastrointestinal: Negative for abdominal pain, constipation and heartburn.  Musculoskeletal: Negative for back pain, joint pain and myalgias.  Skin: Negative.   Neurological: Negative for dizziness.  Psychiatric/Behavioral: The patient is not nervous/anxious.     Physical Exam Constitutional:      General: She is not in acute distress.    Appearance: She is well-developed. She is not diaphoretic.  Eyes:     Comments: Bilateral periorbital redness present with some warmth present the areas is tender to touch.   Neck:     Thyroid: No thyromegaly.  Cardiovascular:     Rate  and Rhythm: Normal rate and regular rhythm.     Heart sounds: Normal heart sounds.     Comments: Unable to palpate pedal or post tibs Pulmonary:     Effort: Pulmonary effort is normal. No respiratory distress.     Breath sounds: Normal breath sounds.  Abdominal:     General: Bowel sounds are normal. There is no distension.     Palpations: Abdomen is soft.     Tenderness: There is no abdominal tenderness.  Musculoskeletal:  General: Normal range of motion.     Cervical back: Neck supple.     Right lower leg: No edema.     Left lower leg: No edema.  Lymphadenopathy:     Cervical: No cervical adenopathy.  Skin:    General: Skin is warm and dry.     Comments: Right heel stage 3: 4 x 2.6 cm    Neurological:     Mental Status: She is alert. Mental status is at baseline.  Psychiatric:        Mood and Affect: Mood normal.       ASSESSMENT/ PLAN:  TODAY  1. Periorbital cellulitis:  Is worse Will begin septra single strength twice daily through 02-20-20 Will provide probiotic while on ab therapy Will check BMP  Will monitor her status.     MD is aware of resident's narcotic use and is in agreement with current plan of care. We will attempt to wean resident as appropriate.  Ok Edwards NP [facility]  Contact [phone] Monday through Friday 8am- 5pm  After hours call [phone]

## 2020-02-25 ENCOUNTER — Other Ambulatory Visit: Payer: Self-pay | Admitting: Adult Health

## 2020-02-25 MED ORDER — OXYCODONE HCL 5 MG PO TABS: 5.0000 mg | ORAL_TABLET | Freq: Four times a day (QID) | ORAL | 0 refills | Status: DC

## 2020-03-10 ENCOUNTER — Non-Acute Institutional Stay (SKILLED_NURSING_FACILITY): Payer: Medicare Other | Admitting: Adult Health

## 2020-03-10 DIAGNOSIS — K5909 Other constipation: Secondary | ICD-10-CM | POA: Diagnosis not present

## 2020-03-10 DIAGNOSIS — Z1159 Encounter for screening for other viral diseases: Secondary | ICD-10-CM | POA: Diagnosis not present

## 2020-03-10 DIAGNOSIS — E876 Hypokalemia: Secondary | ICD-10-CM

## 2020-03-10 DIAGNOSIS — D649 Anemia, unspecified: Secondary | ICD-10-CM | POA: Diagnosis not present

## 2020-03-10 DIAGNOSIS — N1832 Chronic kidney disease, stage 3b: Secondary | ICD-10-CM | POA: Diagnosis not present

## 2020-03-10 NOTE — Progress Notes (Signed)
Location:  [place] Room Number: 145-W Place of Service:  SNF (31)   CODE STATUS:  DNR  Allergies  Allergen Reactions  . Cephalexin Other (See Comments)    Pt. Doesn't remember.   . Cephalexin Other (See Comments)    Pt. Doesn't remember.   . Penicillins Other (See Comments)    Pt. Cant remember.     Chief Complaint  Patient presents with  . Medical Management of Chronic Issues         Chronic constipation:  Hypokalemia    CKD stage 3b:    HPI:  She is a 86 year old long term resident of this facility being seen for the management of her chronic illnesses:Chronic constipation:  Hypokalemia    CKD stage 3b. She continues to be followed by hospice care. Her heel ulceration continues to very slowly improve. Her pain is being managed with oxycodone. There are no reports of constipation present.    Past Medical History:  Diagnosis Date  . Thyroid disease     Past Surgical History:  Procedure Laterality Date  . ABDOMINAL HYSTERECTOMY    . TONSILLECTOMY    . TOTAL HIP ARTHROPLASTY     left    Social History   Socioeconomic History  . Marital status: Widowed    Spouse name: Not on file  . Number of children: Not on file  . Years of education: Not on file  . Highest education level: Not on file  Occupational History  . Occupation: retired   Tobacco Use  . Smoking status: Never Smoker  . Smokeless tobacco: Never Used  Vaping Use  . Vaping Use: Never used  Substance and Sexual Activity  . Alcohol use: No  . Drug use: No  . Sexual activity: Not Currently  Other Topics Concern  . Not on file  Social History Narrative   Long term resident of [facility] .   Social Determinants of Health   Financial Resource Strain:   . Difficulty of Paying Living Expenses: Not on file  Food Insecurity:   . Worried About Charity fundraiser in the Last Year: Not on file  . Ran Out of Food in the Last Year: Not on file  Transportation Needs:   . Lack of  Transportation (Medical): Not on file  . Lack of Transportation (Non-Medical): Not on file  Physical Activity:   . Days of Exercise per Week: Not on file  . Minutes of Exercise per Session: Not on file  Stress:   . Feeling of Stress : Not on file  Social Connections:   . Frequency of Communication with Friends and Family: Not on file  . Frequency of Social Gatherings with Friends and Family: Not on file  . Attends Religious Services: Not on file  . Active Member of Clubs or Organizations: Not on file  . Attends Archivist Meetings: Not on file  . Marital Status: Not on file  Intimate Partner Violence:   . Fear of Current or Ex-Partner: Not on file  . Emotionally Abused: Not on file  . Physically Abused: Not on file  . Sexually Abused: Not on file   Family History  Problem Relation Age of Onset  . Heart attack Mother   . Heart attack Father   . Hypertension Father       VITAL SIGNS BP 116/86   Pulse 82   Temp 97.8 F (36.6 C)   Resp 20   Ht _0  (  1.549 m)   Wt 124 lb 12.8 oz (56.6 kg)   SpO2 98%   BMI 23.58 kg/m   Outpatient Encounter Medications as of 03/10/2020  Medication Sig  . acetaminophen (TYLENOL) 650 MG CR tablet Take 650 mg by mouth every 6 (six) hours.  Roseanne Kaufman [employer] [facility]) OINT Special Instructions: Apply to sacrum and bilateral buttocks qshift for prevention. Every Shift Day, Evening, Night  . feeding supplement, ENSURE ENLIVE, (ENSURE ENLIVE) LIQD Take 237 mLs by mouth with breakfast, with lunch, and with evening meal.   . hydrocortisone cream 1 % Apply 1 application topically daily.  Marland Kitchen levothyroxine (SYNTHROID) 100 MCG tablet Take 100 mcg by mouth daily before breakfast.  . loratadine (CLARITIN) 10 MG tablet Take 10 mg by mouth daily. Chronic Allergies  . NON FORMULARY Regular diet once a day  . oxyCODONE (OXY IR/ROXICODONE) 5 MG immediate release tablet Take 1 tablet (5 mg total) by mouth every 6 (six) hours.  .  sennosides-docusate sodium (SENOKOT-S) 8.6-50 MG tablet Take 1 tablet by mouth daily.  . silver nitrate applicators 60-63 % applicator Apply 1 application topically daily as needed. Special Instructions: Apply silver nitrate stick to rolled borders of right heel wound daily prn epithelialization and rolled borders of wound. Once A Day - PRN PRN 1  . Vitamins A & D (VITAMIN A & D) ointment Apply 1 application topically in the morning, at noon, and at bedtime. Apply to left heel each shift  . [DISCONTINUED] docusate sodium (COLACE) 100 MG capsule Take 100 mg by mouth 2 (two) times daily.   No facility-administered encounter medications on file as of 03/10/2020.     SIGNIFICANT DIAGNOSTIC EXAMS   PREVIOUS  07-04-19: acute abdomen x-ray: No evidence of bowel obstruction or free air. Cardiomegaly  No acute cardiopulmonary disease.  08-23-19: chest x-ray: No acute bone abnormality. No osteomyelitis is noted.  NO NEW EXAMS.    LABS REVIEWED PREVIOUS  06-10-19: wbc 5.9; hgb 11.6; hct 37.2; mcv 102.8 plt 237 glucose 91; bun 50; creat 1.05 ;k+ 4.4; na++ 141; ca 9.1 liver normal albumin 3.5 07-04-19: wbc 10.4; hgb 11.5; hct 36.6; mcv 103.1 plt 214; glucose 190; bun 97; creat 2.41; k+ 5.3; na++ 141; ca 9.0 ast 42; alt 65; alk phos 158; albumin 3.2   07-04-19: vit B 12: 1014; iron 23;tibc 260 tsh 1.382 free T4: 1.66 urine culture: e-coli  07-15-19: glucose 102; bun 71; creat 1.45; k+ 4.7; na++ 139; ca 9.0  07-26-19: tsh 2.447 10-31-19: wbc 7.6; hg 11.8; hct 38.5; mcv 104.6 plt 311; glucose 156; bun 68; creat 1.31; k+ 4.0; na++ 139; ca 9.2  NO NEW LABS.   Review of Systems  Constitutional: Negative for malaise/fatigue.  Respiratory: Negative for cough and shortness of breath.   Cardiovascular: Negative for chest pain, palpitations and leg swelling.  Gastrointestinal: Negative for abdominal pain, constipation and heartburn.  Musculoskeletal: Negative for back pain, joint pain and myalgias.  Skin:        Periorbital redness   Neurological: Negative for dizziness.  Psychiatric/Behavioral: The patient is not nervous/anxious.     Physical Exam Constitutional:      General: She is not in acute distress.    Appearance: She is well-developed. She is not diaphoretic.  Eyes:     Comments: Bilateral periorbital redness present with  tenderness to touch.    Neck:     Thyroid: No thyromegaly.  Cardiovascular:     Rate and Rhythm: Normal rate and regular rhythm.  Heart sounds: Normal heart sounds.     Comments: Unable to palpate pedal or post tibs Pulmonary:     Effort: Pulmonary effort is normal. No respiratory distress.     Breath sounds: Normal breath sounds.  Abdominal:     General: Bowel sounds are normal. There is no distension.     Palpations: Abdomen is soft.     Tenderness: There is no abdominal tenderness.  Musculoskeletal:        General: Normal range of motion.     Cervical back: Neck supple.     Right lower leg: No edema.     Left lower leg: No edema.  Lymphadenopathy:     Cervical: No cervical adenopathy.  Skin:    General: Skin is warm and dry.     Comments: Right heel stage 3: 4.2 x 2.8  Bilateral periorbital redness with slight swelling present.   Neurological:     Mental Status: She is alert. Mental status is at baseline.  Psychiatric:        Mood and Affect: Mood normal.       ASSESSMENT/ PLAN:  TODAY  1. Chronic constipation: is stable will continue colace twice daily   2. Hypokalemia is stable k+ 4.0 will monitor   3. CKD stage 3b: is without change bun 68; creat 1.37 will monitor    PREVIOUS  4. Advanced dementia: without significant change weight is 124 pounds; which is stable will continue to monitor her status.   5. Chronic generalized pain: is stable will continue oxycodone 5 mg every 6 hours routinely; has failed one wean.   6. Failure to thrive in adult: is without change weight is 124 pounds will continue supplements as directed did  not tolerate remeron.   7. Pressure ulcer of right heel stage 3: is stable will continue current wound care and will monitor her status. Will continue supplements as directed   8. Acquired hypothyroidism: is stable tsh 2.447; will continue synthroid 100 mcg daily   9. Urinary incontinence in female: is off medications will monitor           MD is aware of resident's narcotic use and is in agreement with current plan of care. We will attempt to wean resident as appropriate.  Ok Edwards NP [facility]  Contact [phone] Monday through Friday 8am- 5pm  After hours call [phone]

## 2020-03-12 ENCOUNTER — Other Ambulatory Visit: Payer: Self-pay | Admitting: Adult Health

## 2020-03-13 ENCOUNTER — Non-Acute Institutional Stay (SKILLED_NURSING_FACILITY): Payer: Medicare Other | Admitting: Adult Health

## 2020-03-13 ENCOUNTER — Encounter: Payer: Self-pay | Admitting: Adult Health

## 2020-03-13 DIAGNOSIS — L03213 Periorbital cellulitis: Secondary | ICD-10-CM

## 2020-03-13 NOTE — Progress Notes (Signed)
Location:    [place] Room Number: 145-W Place of Service:  SNF (31)   CODE STATUS: DNR  Allergies  Allergen Reactions  . Cephalexin Other (See Comments)    Pt. Doesn't remember.   . Cephalexin Other (See Comments)    Pt. Doesn't remember.   . Penicillins Other (See Comments)    Pt. Cant remember.     Chief Complaint  Patient presents with  . Acute Visit    Facial Rash    HPI:  She has been putting makeup etc on her face. She has facial redness and swelling present. There is warmth present. She denies any pain. She tells me that she has been putting vasoline on her eyes. She has been instructed to stop using products on her face.   Past Medical History:  Diagnosis Date  . Thyroid disease     Past Surgical History:  Procedure Laterality Date  . ABDOMINAL HYSTERECTOMY    . TONSILLECTOMY    . TOTAL HIP ARTHROPLASTY     left    Social History   Socioeconomic History  . Marital status: Widowed    Spouse name: Not on file  . Number of children: Not on file  . Years of education: Not on file  . Highest education level: Not on file  Occupational History  . Occupation: retired   Tobacco Use  . Smoking status: Never Smoker  . Smokeless tobacco: Never Used  Vaping Use  . Vaping Use: Never used  Substance and Sexual Activity  . Alcohol use: No  . Drug use: No  . Sexual activity: Not Currently  Other Topics Concern  . Not on file  Social History Narrative   Long term resident of [facility] .   Social Determinants of Health   Financial Resource Strain:   . Difficulty of Paying Living Expenses: Not on file  Food Insecurity:   . Worried About Charity fundraiser in the Last Year: Not on file  . Ran Out of Food in the Last Year: Not on file  Transportation Needs:   . Lack of Transportation (Medical): Not on file  . Lack of Transportation (Non-Medical): Not on file  Physical Activity:   . Days of Exercise per Week: Not on file  . Minutes of  Exercise per Session: Not on file  Stress:   . Feeling of Stress : Not on file  Social Connections:   . Frequency of Communication with Friends and Family: Not on file  . Frequency of Social Gatherings with Friends and Family: Not on file  . Attends Religious Services: Not on file  . Active Member of Clubs or Organizations: Not on file  . Attends Archivist Meetings: Not on file  . Marital Status: Not on file  Intimate Partner Violence:   . Fear of Current or Ex-Partner: Not on file  . Emotionally Abused: Not on file  . Physically Abused: Not on file  . Sexually Abused: Not on file   Family History  Problem Relation Age of Onset  . Heart attack Mother   . Heart attack Father   . Hypertension Father       VITAL SIGNS BP 116/86   Pulse 82   Temp (!) 97.3 F (36.3 C)   Resp 20   Ht 5' 1"  (1.549 m)   Wt 124 lb 12.8 oz (56.6 kg)   SpO2 90%   BMI 23.58 kg/m   Outpatient Encounter Medications as of 03/13/2020  Medication Sig  . acetaminophen (TYLENOL) 650 MG CR tablet Take 650 mg by mouth every 6 (six) hours.  Roseanne Kaufman [place] [facility]) OINT Special Instructions: Apply to sacrum and bilateral buttocks qshift for prevention. Every Shift Day, Evening, Night  . feeding supplement, ENSURE ENLIVE, (ENSURE ENLIVE) LIQD Take 237 mLs by mouth with breakfast, with lunch, and with evening meal.   . hydrocortisone cream (PREPARATION H) 1 % Apply 1 application topically daily.  Marland Kitchen levothyroxine (SYNTHROID) 100 MCG tablet Take 100 mcg by mouth daily before breakfast.  . loratadine (CLARITIN) 10 MG tablet Take 10 mg by mouth daily. Chronic Allergies  . NON FORMULARY Regular diet once a day  . oxyCODONE (OXY IR/ROXICODONE) 5 MG immediate release tablet Take 1 tablet (5 mg total) by mouth every 6 (six) hours.  . sennosides-docusate sodium (SENOKOT-S) 8.6-50 MG tablet Take 1 tablet by mouth daily.  . silver nitrate applicators 93-81 % applicator Apply 1 application topically  daily as needed. Special Instructions: Apply silver nitrate stick to rolled borders of right heel wound daily prn epithelialization and rolled borders of wound. Once A Day - PRN PRN 1  . Vitamins A & D (VITAMIN A & D) ointment Apply 1 application topically in the morning, at noon, and at bedtime. Apply to left heel each shift  . [DISCONTINUED] hydrocortisone cream 1 % Apply 1 application topically daily.   No facility-administered encounter medications on file as of 03/13/2020.     SIGNIFICANT DIAGNOSTIC EXAMS   PREVIOUS  07-04-19: acute abdomen x-ray: No evidence of bowel obstruction or free air. Cardiomegaly  No acute cardiopulmonary disease.  08-23-19: chest x-ray: No acute bone abnormality. No osteomyelitis is noted.  NO NEW EXAMS.    LABS REVIEWED PREVIOUS  06-10-19: wbc 5.9; hgb 11.6; hct 37.2; mcv 102.8 plt 237 glucose 91; bun 50; creat 1.05 ;k+ 4.4; na++ 141; ca 9.1 liver normal albumin 3.5 07-04-19: wbc 10.4; hgb 11.5; hct 36.6; mcv 103.1 plt 214; glucose 190; bun 97; creat 2.41; k+ 5.3; na++ 141; ca 9.0 ast 42; alt 65; alk phos 158; albumin 3.2   07-04-19: vit B 12: 1014; iron 23;tibc 260 tsh 1.382 free T4: 1.66 urine culture: e-coli  07-15-19: glucose 102; bun 71; creat 1.45; k+ 4.7; na++ 139; ca 9.0  07-26-19: tsh 2.447 10-31-19: wbc 7.6; hg 11.8; hct 38.5; mcv 104.6 plt 311; glucose 156; bun 68; creat 1.31; k+ 4.0; na++ 139; ca 9.2  NO NEW LABS.   Review of Systems  Constitutional: Negative for malaise/fatigue.  Respiratory: Negative for cough and shortness of breath.   Cardiovascular: Negative for chest pain, palpitations and leg swelling.  Gastrointestinal: Negative for abdominal pain, constipation and heartburn.  Musculoskeletal: Negative for back pain, joint pain and myalgias.  Skin: Negative.   Neurological: Negative for dizziness.  Psychiatric/Behavioral: The patient is not nervous/anxious.     Physical Exam Constitutional:      General: She is not in acute  distress.    Appearance: She is well-developed. She is not diaphoretic.  Neck:     Thyroid: No thyromegaly.  Cardiovascular:     Rate and Rhythm: Normal rate and regular rhythm.     Heart sounds: Normal heart sounds.     Comments: Unable to palpate pedal or post tibs Pulmonary:     Effort: Pulmonary effort is normal. No respiratory distress.     Breath sounds: Normal breath sounds.  Abdominal:     General: Bowel sounds are normal. There is no distension.  Palpations: Abdomen is soft.     Tenderness: There is no abdominal tenderness.  Musculoskeletal:        General: Normal range of motion.     Cervical back: Neck supple.     Right lower leg: No edema.     Left lower leg: No edema.  Lymphadenopathy:     Cervical: No cervical adenopathy.  Skin:    General: Skin is warm and dry.     Comments: Periorbital redness; warmth and swelling present.  Right heel stage 3: 4.2 x 2.6 cm  Neurological:     Mental Status: She is alert. Mental status is at baseline.  Psychiatric:        Mood and Affect: Mood normal.       ASSESSMENT/ PLAN:  TODAY  1. Cellulitis of periorbital region of both eyes.   Is worse will begin doxycycline 100 mg twice daily for one week. Will monitor her status     MD is aware of resident's narcotic use and is in agreement with current plan of care. We will attempt to wean resident as appropriate.  Ok Edwards NP [facility]  Contact [phone] Monday through Friday 8am- 5pm  After hours call [phone]

## 2020-03-17 ENCOUNTER — Encounter: Payer: Self-pay | Admitting: Adult Health

## 2020-03-17 ENCOUNTER — Non-Acute Institutional Stay (SKILLED_NURSING_FACILITY): Payer: Medicare Other | Admitting: Adult Health

## 2020-03-17 DIAGNOSIS — B9689 Other specified bacterial agents as the cause of diseases classified elsewhere: Secondary | ICD-10-CM

## 2020-03-17 DIAGNOSIS — L03213 Periorbital cellulitis: Secondary | ICD-10-CM | POA: Insufficient documentation

## 2020-03-17 DIAGNOSIS — H109 Unspecified conjunctivitis: Secondary | ICD-10-CM | POA: Diagnosis not present

## 2020-03-17 NOTE — Progress Notes (Signed)
Location:    [place] Room Number: 145-W Place of Service:  SNF (31)   CODE STATUS: DNR  Allergies  Allergen Reactions  . Cephalexin Other (See Comments)    Pt. Doesn't remember.   . Cephalexin Other (See Comments)    Pt. Doesn't remember.   . Penicillins Other (See Comments)    Pt. Cant remember.     Chief Complaint  Patient presents with  . Acute Visit    Facial Cellulitis     HPI:  She is currently taking doxycycline for her periorbital cellulitis. Her eyes are red and inflamed with [place] drainage present. She tells that her eyes hurt and she tells me that her face is flaking. She has been told not to pick at her skin; she has verbalized understanding.   Past Medical History:  Diagnosis Date  . Thyroid disease     Past Surgical History:  Procedure Laterality Date  . ABDOMINAL HYSTERECTOMY    . TONSILLECTOMY    . TOTAL HIP ARTHROPLASTY     left    Social History   Socioeconomic History  . Marital status: Widowed    Spouse name: Not on file  . Number of children: Not on file  . Years of education: Not on file  . Highest education level: Not on file  Occupational History  . Occupation: retired   Tobacco Use  . Smoking status: Never Smoker  . Smokeless tobacco: Never Used  Vaping Use  . Vaping Use: Never used  Substance and Sexual Activity  . Alcohol use: No  . Drug use: No  . Sexual activity: Not Currently  Other Topics Concern  . Not on file  Social History Narrative   Long term resident of [facility] .   Social Determinants of Health   Financial Resource Strain:   . Difficulty of Paying Living Expenses: Not on file  Food Insecurity:   . Worried About Charity fundraiser in the Last Year: Not on file  . Ran Out of Food in the Last Year: Not on file  Transportation Needs:   . Lack of Transportation (Medical): Not on file  . Lack of Transportation (Non-Medical): Not on file  Physical Activity:   . Days of Exercise per Week:  Not on file  . Minutes of Exercise per Session: Not on file  Stress:   . Feeling of Stress : Not on file  Social Connections:   . Frequency of Communication with Friends and Family: Not on file  . Frequency of Social Gatherings with Friends and Family: Not on file  . Attends Religious Services: Not on file  . Active Member of Clubs or Organizations: Not on file  . Attends Archivist Meetings: Not on file  . Marital Status: Not on file  Intimate Partner Violence:   . Fear of Current or Ex-Partner: Not on file  . Emotionally Abused: Not on file  . Physically Abused: Not on file  . Sexually Abused: Not on file   Family History  Problem Relation Age of Onset  . Heart attack Mother   . Heart attack Father   . Hypertension Father       VITAL SIGNS BP 124/76   Pulse 70   Temp (!) 97.3 F (36.3 C)   Resp 20   Ht 5' 1"  (1.549 m)   Wt 126 lb 3.2 oz (57.2 kg)   SpO2 90%   BMI 23.85 kg/m   Outpatient Encounter Medications as  of 03/17/2020  Medication Sig  . acetaminophen (TYLENOL) 650 MG CR tablet Take 650 mg by mouth every 6 (six) hours.  Roseanne Kaufman [place] [facility]) OINT Special Instructions: Apply to sacrum and bilateral buttocks qshift for prevention. Every Shift Day, Evening, Night  . doxycycline (VIBRAMYCIN) 100 MG capsule Take 100 mg by mouth 2 (two) times daily.  . feeding supplement, ENSURE ENLIVE, (ENSURE ENLIVE) LIQD Take 237 mLs by mouth with breakfast, with lunch, and with evening meal.   . hydrocortisone cream (PREPARATION H) 1 % Apply 1 application topically daily.  Marland Kitchen levothyroxine (SYNTHROID) 100 MCG tablet Take 100 mcg by mouth daily before breakfast.  . loratadine (CLARITIN) 10 MG tablet Take 10 mg by mouth daily. Chronic Allergies  . NON FORMULARY Regular diet once a day  . oxyCODONE (OXY IR/ROXICODONE) 5 MG immediate release tablet Take 1 tablet (5 mg total) by mouth every 6 (six) hours.  . sennosides-docusate sodium (SENOKOT-S) 8.6-50 MG  tablet Take 1 tablet by mouth daily.  . silver nitrate applicators 48-18 % applicator Apply 1 application topically daily as needed. Special Instructions: Apply silver nitrate stick to rolled borders of right heel wound daily prn epithelialization and rolled borders of wound. Once A Day - PRN PRN 1  . Vitamins A & D (VITAMIN A & D) ointment Apply 1 application topically in the morning, at noon, and at bedtime. Apply to left heel each shift   No facility-administered encounter medications on file as of 03/17/2020.     SIGNIFICANT DIAGNOSTIC EXAMS   PREVIOUS  07-04-19: acute abdomen x-ray: No evidence of bowel obstruction or free air. Cardiomegaly  No acute cardiopulmonary disease.  08-23-19: chest x-ray: No acute bone abnormality. No osteomyelitis is noted.  NO NEW EXAMS.    LABS REVIEWED PREVIOUS  06-10-19: wbc 5.9; hgb 11.6; hct 37.2; mcv 102.8 plt 237 glucose 91; bun 50; creat 1.05 ;k+ 4.4; na++ 141; ca 9.1 liver normal albumin 3.5 07-04-19: wbc 10.4; hgb 11.5; hct 36.6; mcv 103.1 plt 214; glucose 190; bun 97; creat 2.41; k+ 5.3; na++ 141; ca 9.0 ast 42; alt 65; alk phos 158; albumin 3.2   07-04-19: vit B 12: 1014; iron 23;tibc 260 tsh 1.382 free T4: 1.66 urine culture: e-coli  07-15-19: glucose 102; bun 71; creat 1.45; k+ 4.7; na++ 139; ca 9.0  07-26-19: tsh 2.447 10-31-19: wbc 7.6; hg 11.8; hct 38.5; mcv 104.6 plt 311; glucose 156; bun 68; creat 1.31; k+ 4.0; na++ 139; ca 9.2  NO NEW LABS.   Review of Systems  Unable to perform ROS: Dementia (unable to fully participate )    Physical Exam Constitutional:      General: She is not in acute distress.    Appearance: She is well-developed. She is not diaphoretic.  Eyes:     Comments: Conjunctivae are red and inflamed with [place] Feria drainage present.    Neck:     Thyroid: No thyromegaly.  Cardiovascular:     Rate and Rhythm: Normal rate and regular rhythm.     Heart sounds: Normal heart sounds.     Comments: Unable to palpate pedal or  post tibs Pulmonary:     Effort: Pulmonary effort is normal. No respiratory distress.     Breath sounds: Normal breath sounds.  Abdominal:     General: Bowel sounds are normal. There is no distension.     Palpations: Abdomen is soft.     Tenderness: There is no abdominal tenderness.  Musculoskeletal:  General: Normal range of motion.     Cervical back: Neck supple.     Right lower leg: No edema.     Left lower leg: No edema.  Lymphadenopathy:     Cervical: No cervical adenopathy.  Skin:    General: Skin is warm and dry.  Neurological:     Mental Status: She is alert. Mental status is at baseline.  Psychiatric:        Mood and Affect: Mood normal.       ASSESSMENT/ PLAN:  TODAY  1. Bilateral bacterial conjunctivitis   Is worse Will begin cipro 0.3% 1 drop to both eyes every 4 hours for one week  Will monitor her status.   MD is aware of resident's narcotic use and is in agreement with current plan of care. We will attempt to wean resident as appropriate.  Ok Edwards NP [facility]  Contact [phone] Monday through Friday 8am- 5pm  After hours call [phone]

## 2020-03-18 DIAGNOSIS — H109 Unspecified conjunctivitis: Secondary | ICD-10-CM | POA: Insufficient documentation

## 2020-03-18 DIAGNOSIS — B9689 Other specified bacterial agents as the cause of diseases classified elsewhere: Secondary | ICD-10-CM | POA: Insufficient documentation

## 2020-03-24 ENCOUNTER — Non-Acute Institutional Stay: Payer: Self-pay | Admitting: Adult Health

## 2020-03-24 ENCOUNTER — Encounter: Payer: Self-pay | Admitting: Adult Health

## 2020-03-24 ENCOUNTER — Other Ambulatory Visit: Payer: Self-pay | Admitting: Adult Health

## 2020-03-24 DIAGNOSIS — G8929 Other chronic pain: Secondary | ICD-10-CM

## 2020-03-24 MED ORDER — OXYCODONE HCL 5 MG PO TABS: 5.0000 mg | ORAL_TABLET | Freq: Three times a day (TID) | ORAL | 0 refills | Status: DC

## 2020-03-24 NOTE — Progress Notes (Signed)
Location:  [place] Room Number: 145-W Place of Service:  SNF (31)   CODE STATUS: DNR  Allergies  Allergen Reactions   Cephalexin Other (See Comments)    Pt. Doesn't remember.    Cephalexin Other (See Comments)    Pt. Doesn't remember.    Penicillins Other (See Comments)    Pt. Cant remember.     Chief Complaint  Patient presents with   Acute Visit    Medication review    HPI:  She is presently taking oxycodone 5 mg every 6 hours for pain management. She does have right lower extremity contracture. Her face and eyes are improving. Her heel ulceration is improving; has been treated with silver nitrate. She has failed one wean off the oxycodone. The care plan team: we feel as though she is ready for a GDR.    Past Medical History:  Diagnosis Date   Thyroid disease     Past Surgical History:  Procedure Laterality Date   ABDOMINAL HYSTERECTOMY     TONSILLECTOMY     TOTAL HIP ARTHROPLASTY     left    Social History   Socioeconomic History   Marital status: Widowed    Spouse name: Not on file   Number of children: Not on file   Years of education: Not on file   Highest education level: Not on file  Occupational History   Occupation: retired   Tobacco Use   Smoking status: Never Smoker   Smokeless tobacco: Never Used  Scientific laboratory technician Use: Never used  Substance and Sexual Activity   Alcohol use: No   Drug use: No   Sexual activity: Not Currently  Other Topics Concern   Not on file  Social History Narrative   Long term resident of [facility] .   Social Determinants of Health   Financial Resource Strain: Not on file  Food Insecurity: Not on file  Transportation Needs: Not on file  Physical Activity: Not on file  Stress: Not on file  Social Connections: Not on file  Intimate Partner Violence: Not on file   Family History  Problem Relation Age of Onset   Heart attack Mother    Heart attack Father     Hypertension Father       VITAL SIGNS BP (!) 112/56    Pulse (!) 56    Temp (!) 97.1 F (36.2 C)    Ht 5' 1"  (1.549 m)    Wt 126 lb 3.2 oz (57.2 kg)    SpO2 90%    BMI 23.85 kg/m   Outpatient Encounter Medications as of 03/24/2020  Medication Sig   acetaminophen (TYLENOL) 650 MG CR tablet Take 650 mg by mouth every 6 (six) hours.   Balsam [place]-Castor Oil [facility]) OINT Special Instructions: Apply to sacrum and bilateral buttocks qshift for prevention. Every Shift Day, Evening, Night   feeding supplement, ENSURE ENLIVE, (ENSURE ENLIVE) LIQD Take 237 mLs by mouth with breakfast, with lunch, and with evening meal.    hydrocortisone cream (PREPARATION H) 1 % Apply 1 application topically daily.   levothyroxine (SYNTHROID) 100 MCG tablet Take 100 mcg by mouth daily before breakfast.   loratadine (CLARITIN) 10 MG tablet Take 10 mg by mouth daily. Chronic Allergies   NON FORMULARY Regular diet once a day   oxyCODONE (OXY IR/ROXICODONE) 5 MG immediate release tablet Take 1 tablet (5 mg total) by mouth every 6 (six) hours.   sennosides-docusate sodium (SENOKOT-S) 8.6-50 MG tablet  Take 1 tablet by mouth daily.   silver nitrate applicators 17-79 % applicator Apply 1 application topically daily as needed. Special Instructions: Apply silver nitrate stick to rolled borders of right heel wound daily prn epithelialization and rolled borders of wound. Once A Day - PRN PRN 1   Vitamins A & D (VITAMIN A & D) ointment Apply 1 application topically in the morning, at noon, and at bedtime. Apply to left heel each shift   No facility-administered encounter medications on file as of 03/24/2020.     SIGNIFICANT DIAGNOSTIC EXAMS  PREVIOUS  07-04-19: acute abdomen x-ray: No evidence of bowel obstruction or free air. Cardiomegaly  No acute cardiopulmonary disease.  08-23-19: chest x-ray: No acute bone abnormality. No osteomyelitis is noted.  NO NEW EXAMS.    LABS REVIEWED PREVIOUS  06-10-19:  wbc 5.9; hgb 11.6; hct 37.2; mcv 102.8 plt 237 glucose 91; bun 50; creat 1.05 ;k+ 4.4; na++ 141; ca 9.1 liver normal albumin 3.5 07-04-19: wbc 10.4; hgb 11.5; hct 36.6; mcv 103.1 plt 214; glucose 190; bun 97; creat 2.41; k+ 5.3; na++ 141; ca 9.0 ast 42; alt 65; alk phos 158; albumin 3.2   07-04-19: vit B 12: 1014; iron 23;tibc 260 tsh 1.382 free T4: 1.66 urine culture: e-coli  07-15-19: glucose 102; bun 71; creat 1.45; k+ 4.7; na++ 139; ca 9.0  07-26-19: tsh 2.447 10-31-19: wbc 7.6; hg 11.8; hct 38.5; mcv 104.6 plt 311; glucose 156; bun 68; creat 1.31; k+ 4.0; na++ 139; ca 9.2  NO NEW LABS.   Review of Systems  Unable to perform ROS: Dementia (unable to participate )    Physical Exam Constitutional:      General: She is not in acute distress.    Appearance: She is well-developed and well-nourished. She is not diaphoretic.  Eyes:     Conjunctiva/sclera: Conjunctivae normal.  Neck:     Thyroid: No thyromegaly.  Cardiovascular:     Rate and Rhythm: Normal rate and regular rhythm.     Pulses: Intact distal pulses.     Heart sounds: Normal heart sounds.     Comments: Unable to palpate pedal or post tibs Pulmonary:     Effort: Pulmonary effort is normal. No respiratory distress.     Breath sounds: Normal breath sounds.  Abdominal:     General: Bowel sounds are normal. There is no distension.     Palpations: Abdomen is soft.     Tenderness: There is no abdominal tenderness.  Musculoskeletal:        General: No edema.     Cervical back: Neck supple.     Right lower leg: No edema.     Left lower leg: No edema.     Comments: Right lower extremity contracture   Lymphadenopathy:     Cervical: No cervical adenopathy.  Skin:    General: Skin is warm and dry.     Comments: Right heel stage 3: 2.8 x 2 cm   Neurological:     Mental Status: She is alert. Mental status is at baseline.  Psychiatric:        Mood and Affect: Mood and affect and mood normal.        ASSESSMENT/  PLAN:  TODAY  1. Chronic generalized pain:   She is stable will lower her oxycodone to 5 mg every 8 hours; she has failed one wean in the past. Will monitor her response     MD is aware of resident's narcotic use and is in  agreement with current plan of care. We will attempt to wean resident as appropriate.  Ok Edwards NP [facility]  Contact [phone] Monday through Friday 8am- 5pm  After hours call [phone]

## 2020-04-08 DIAGNOSIS — D649 Anemia, unspecified: Secondary | ICD-10-CM | POA: Diagnosis not present

## 2020-04-08 DIAGNOSIS — Z1159 Encounter for screening for other viral diseases: Secondary | ICD-10-CM | POA: Diagnosis not present

## 2020-04-09 ENCOUNTER — Non-Acute Institutional Stay (SKILLED_NURSING_FACILITY): Payer: Medicare Other | Admitting: Adult Health

## 2020-04-09 ENCOUNTER — Encounter: Payer: Self-pay | Admitting: Adult Health

## 2020-04-09 DIAGNOSIS — R52 Pain, unspecified: Secondary | ICD-10-CM | POA: Diagnosis not present

## 2020-04-09 DIAGNOSIS — G8929 Other chronic pain: Secondary | ICD-10-CM | POA: Diagnosis not present

## 2020-04-09 DIAGNOSIS — F0390 Unspecified dementia without behavioral disturbance: Secondary | ICD-10-CM

## 2020-04-09 DIAGNOSIS — R627 Adult failure to thrive: Secondary | ICD-10-CM | POA: Diagnosis not present

## 2020-04-09 DIAGNOSIS — F03C0 Unspecified dementia, severe, without behavioral disturbance, psychotic disturbance, mood disturbance, and anxiety: Secondary | ICD-10-CM

## 2020-04-09 NOTE — Progress Notes (Signed)
Location:  [facility] Room Number: 694 Place of Service:  SNF (31)   CODE STATUS: dnr  Allergies  Allergen Reactions  . Cephalexin Other (See Comments)    Pt. Doesn't remember.   . Cephalexin Other (See Comments)    Pt. Doesn't remember.   . Penicillins Other (See Comments)    Pt. Cant remember.     Chief Complaint  Patient presents with  . Medical Management of Chronic Issues       Advanced dementia:    Chronic generalized pain:  Failure to thrive in adult:     HPI:  She is a 86 year old long term resident of this facility being seen for the management of her chronic illnesses; Advanced dementia:    Chronic generalized pain:  Failure to thrive in adult. She continues to be followed by hospice care. There are no reports of uncontrolled pain; is tolerating the wean off the oxycodone. There are no reports of anxiety or agitation. She has not had any further complications for her facial rash. There are no reports of imsomnia.    Past Medical History:  Diagnosis Date  . Thyroid disease     Past Surgical History:  Procedure Laterality Date  . ABDOMINAL HYSTERECTOMY    . TONSILLECTOMY    . TOTAL HIP ARTHROPLASTY     left    Social History   Socioeconomic History  . Marital status: Widowed    Spouse name: Not on file  . Number of children: Not on file  . Years of education: Not on file  . Highest education level: Not on file  Occupational History  . Occupation: retired   Tobacco Use  . Smoking status: Never Smoker  . Smokeless tobacco: Never Used  Vaping Use  . Vaping Use: Never used  Substance and Sexual Activity  . Alcohol use: No  . Drug use: No  . Sexual activity: Not Currently  Other Topics Concern  . Not on file  Social History Narrative   Long term resident of [facility] .   Social Determinants of Health   Financial Resource Strain: Not on file  Food Insecurity: Not on file  Transportation Needs: Not on file  Physical  Activity: Not on file  Stress: Not on file  Social Connections: Not on file  Intimate Partner Violence: Not on file   Family History  Problem Relation Age of Onset  . Heart attack Mother   . Heart attack Father   . Hypertension Father       VITAL SIGNS BP 134/68   Pulse 80   Temp 97.9 F (36.6 C)   Resp 16   Ht _0  (1.549 m)   Wt 126 lb 3.2 oz (57.2 kg)   SpO2 97%   BMI 23.85 kg/m   Outpatient Encounter Medications as of 04/09/2020  Medication Sig  . acetaminophen (TYLENOL) 650 MG CR tablet Take 650 mg by mouth every 6 (six) hours.  Roseanne Kaufman [employer] [facility]) OINT Special Instructions: Apply to sacrum and bilateral buttocks qshift for prevention. Every Shift Day, Evening, Night  . feeding supplement, ENSURE ENLIVE, (ENSURE ENLIVE) LIQD Take 237 mLs by mouth with breakfast, with lunch, and with evening meal.   . hydrocortisone cream (PREPARATION H) 1 % Apply 1 application topically daily.  Marland Kitchen levothyroxine (SYNTHROID) 100 MCG tablet Take 100 mcg by mouth daily before breakfast.  . loratadine (CLARITIN) 10 MG tablet Take 10 mg by mouth daily. Chronic Allergies  .  NON FORMULARY Regular diet once a day  . oxyCODONE (OXY IR/ROXICODONE) 5 MG immediate release tablet Take 1 tablet (5 mg total) by mouth every 8 (eight) hours.  . sennosides-docusate sodium (SENOKOT-S) 8.6-50 MG tablet Take 1 tablet by mouth daily.  . silver nitrate applicators 70-17 % applicator Apply 1 application topically daily as needed. Special Instructions: Apply silver nitrate stick to rolled borders of right heel wound daily prn epithelialization and rolled borders of wound. Once A Day - PRN PRN 1  . Vitamins A & D (VITAMIN A & D) ointment Apply 1 application topically in the morning, at noon, and at bedtime. Apply to left heel each shift   No facility-administered encounter medications on file as of 04/09/2020.     SIGNIFICANT DIAGNOSTIC EXAMS  PREVIOUS  07-04-19: acute abdomen x-ray: No  evidence of bowel obstruction or free air. Cardiomegaly  No acute cardiopulmonary disease.  08-23-19: chest x-ray: No acute bone abnormality. No osteomyelitis is noted.  NO NEW EXAMS.    LABS REVIEWED PREVIOUS  06-10-19: wbc 5.9; hgb 11.6; hct 37.2; mcv 102.8 plt 237 glucose 91; bun 50; creat 1.05 ;k+ 4.4; na++ 141; ca 9.1 liver normal albumin 3.5 07-04-19: wbc 10.4; hgb 11.5; hct 36.6; mcv 103.1 plt 214; glucose 190; bun 97; creat 2.41; k+ 5.3; na++ 141; ca 9.0 ast 42; alt 65; alk phos 158; albumin 3.2   07-04-19: vit B 12: 1014; iron 23;tibc 260 tsh 1.382 free T4: 1.66 urine culture: e-coli  07-15-19: glucose 102; bun 71; creat 1.45; k+ 4.7; na++ 139; ca 9.0  07-26-19: tsh 2.447 10-31-19: wbc 7.6; hg 11.8; hct 38.5; mcv 104.6 plt 311; glucose 156; bun 68; creat 1.31; k+ 4.0; na++ 139; ca 9.2  NO NEW LABS.   Review of Systems  Unable to perform ROS: Dementia (unable to participate )    Physical Exam Constitutional:      General: She is not in acute distress.    Appearance: She is well-developed and well-nourished. She is not diaphoretic.  Neck:     Thyroid: No thyromegaly.  Cardiovascular:     Rate and Rhythm: Normal rate and regular rhythm.     Pulses: Intact distal pulses.     Heart sounds: Normal heart sounds.     Comments: Unable to palpate pedal or post tibs Pulmonary:     Effort: Pulmonary effort is normal. No respiratory distress.     Breath sounds: Normal breath sounds.  Abdominal:     General: Bowel sounds are normal. There is no distension.     Palpations: Abdomen is soft.     Tenderness: There is no abdominal tenderness.  Musculoskeletal:        General: No edema.     Cervical back: Neck supple.     Right lower leg: No edema.     Left lower leg: No edema.     Comments: Right lower extremity contracture    Lymphadenopathy:     Cervical: No cervical adenopathy.  Skin:    General: Skin is warm and dry.     Comments: Right heel stage 3: 2.4 x 1.8 cm  Neurological:      Mental Status: She is alert. Mental status is at baseline.  Psychiatric:        Mood and Affect: Mood and affect and mood normal.      ASSESSMENT/ PLAN:  TODAY  1. Advanced dementia: is without change: weight is 126 pounds; which is stable; will continue to monitor her status.  2. Chronic generalized pain: is stable will continue oxycodone 5 mg every 8 hours routinely   3. Failure to thrive in adult: is without change: weight is 126 pounds. Will monitor her status will continue supplements as directed. She is unable to tolerate remeron.    PREVIOUS  4. Pressure ulcer of right heel stage 3: is stable will continue current wound care and will monitor her status. Will continue supplements as directed   5. Acquired hypothyroidism: is stable tsh 2.447; will continue synthroid 100 mcg daily   6. Urinary incontinence in female: is off medications will monitor   7. Chronic constipation: is stable will continue colace twice daily   8. Hypokalemia is stable k+ 4.0 will monitor   9. CKD stage 3b: is without change bun 68; creat 1.37 will monitor     MD is aware of resident's narcotic use and is in agreement with current plan of care. We will attempt to wean resident as appropriate.  Ok Edwards NP [facility]  Contact [phone] Monday through Friday 8am- 5pm  After hours call [phone]

## 2020-04-13 DIAGNOSIS — D649 Anemia, unspecified: Secondary | ICD-10-CM | POA: Diagnosis not present

## 2020-04-13 DIAGNOSIS — Z1159 Encounter for screening for other viral diseases: Secondary | ICD-10-CM | POA: Diagnosis not present

## 2020-04-22 DIAGNOSIS — D649 Anemia, unspecified: Secondary | ICD-10-CM | POA: Diagnosis not present

## 2020-04-22 DIAGNOSIS — Z1159 Encounter for screening for other viral diseases: Secondary | ICD-10-CM | POA: Diagnosis not present

## 2020-04-23 ENCOUNTER — Encounter: Payer: Self-pay | Admitting: Adult Health

## 2020-04-23 ENCOUNTER — Non-Acute Institutional Stay (SKILLED_NURSING_FACILITY): Payer: Medicare Other | Admitting: Adult Health

## 2020-04-23 DIAGNOSIS — R69 Illness, unspecified: Secondary | ICD-10-CM | POA: Diagnosis not present

## 2020-04-23 DIAGNOSIS — N1832 Chronic kidney disease, stage 3b: Secondary | ICD-10-CM

## 2020-04-23 DIAGNOSIS — R627 Adult failure to thrive: Secondary | ICD-10-CM

## 2020-04-23 DIAGNOSIS — F0390 Unspecified dementia without behavioral disturbance: Secondary | ICD-10-CM | POA: Diagnosis not present

## 2020-04-23 DIAGNOSIS — F03C0 Unspecified dementia, severe, without behavioral disturbance, psychotic disturbance, mood disturbance, and anxiety: Secondary | ICD-10-CM

## 2020-04-23 NOTE — Progress Notes (Signed)
Location:  [place] Room Number: 145/W Place of Service:  SNF (31)   CODE STATUS: DNR  Allergies  Allergen Reactions  . Cephalexin Other (See Comments)    Pt. Doesn't remember.   . Cephalexin Other (See Comments)    Pt. Doesn't remember.   . Penicillins Other (See Comments)    Pt. Cant remember.     Chief Complaint  Patient presents with  . Acute Visit    Care Plan Meeting     HPI:  We have come together for her care plan meeting. Family present. BIMS 6/15 mood 2/30.  She requires extensive to dependent in adls care. Is able to feed self. She is frequently incontinent of bladder and bowel. No recent falls. Her weight is stable 124 pounds with a good appetite. She has a stage 3 right heel ulceration. She will attend activities but doe snot like large crowds. Her pain is managed with her current regimen. She denies any insomnia; on constipation. She continues to be followed for her chronic illnesses including: Advanced dementia Stage 3b chronic kidney disease Failure to thrive in adult  Past Medical History:  Diagnosis Date  . Thyroid disease     Past Surgical History:  Procedure Laterality Date  . ABDOMINAL HYSTERECTOMY    . TONSILLECTOMY    . TOTAL HIP ARTHROPLASTY     left    Social History   Socioeconomic History  . Marital status: Widowed    Spouse name: Not on file  . Number of children: Not on file  . Years of education: Not on file  . Highest education level: Not on file  Occupational History  . Occupation: retired   Tobacco Use  . Smoking status: Never Smoker  . Smokeless tobacco: Never Used  Vaping Use  . Vaping Use: Never used  Substance and Sexual Activity  . Alcohol use: No  . Drug use: No  . Sexual activity: Not Currently  Other Topics Concern  . Not on file  Social History Narrative   Long term resident of [facility] .   Social Determinants of Health   Financial Resource Strain: Not on file  Food Insecurity: Not on  file  Transportation Needs: Not on file  Physical Activity: Not on file  Stress: Not on file  Social Connections: Not on file  Intimate Partner Violence: Not on file   Family History  Problem Relation Age of Onset  . Heart attack Mother   . Heart attack Father   . Hypertension Father       VITAL SIGNS BP 120/70   Pulse 79   Temp 98.1 F (36.7 C)   Resp 16   Ht 5' 1"  (1.549 m)   Wt 124 lb 3.2 oz (56.3 kg)   BMI 23.47 kg/m   Outpatient Encounter Medications as of 04/23/2020  Medication Sig  . acetaminophen (TYLENOL) 650 MG CR tablet Take 650 mg by mouth every 6 (six) hours.  Roseanne Kaufman [place] [facility]) OINT Special Instructions: Apply to sacrum and bilateral buttocks qshift for prevention. Every Shift Day, Evening, Night  . feeding supplement, ENSURE ENLIVE, (ENSURE ENLIVE) LIQD Take 237 mLs by mouth with breakfast, with lunch, and with evening meal.   . hydrocortisone cream 1 % Apply 1 application topically daily.  Marland Kitchen levothyroxine (SYNTHROID) 100 MCG tablet Take 100 mcg by mouth daily before breakfast.  . loratadine (CLARITIN) 10 MG tablet Take 10 mg by mouth daily. Chronic Allergies  . NON FORMULARY Regular  diet once a day  . oxyCODONE (OXY IR/ROXICODONE) 5 MG immediate release tablet Take 1 tablet (5 mg total) by mouth every 8 (eight) hours.  . sennosides-docusate sodium (SENOKOT-S) 8.6-50 MG tablet Take 1 tablet by mouth daily.  . silver nitrate applicators 38-46 % applicator Apply 1 application topically daily as needed. Special Instructions: Apply silver nitrate stick to rolled borders of right heel wound daily prn epithelialization and rolled borders of wound. Once A Day - PRN PRN 1  . Vitamins A & D (VITAMIN A & D) ointment Apply 1 application topically in the morning, at noon, and at bedtime. Apply to left heel each shift   No facility-administered encounter medications on file as of 04/23/2020.     SIGNIFICANT DIAGNOSTIC EXAMS   PREVIOUS  07-04-19:  acute abdomen x-ray: No evidence of bowel obstruction or free air. Cardiomegaly  No acute cardiopulmonary disease.  08-23-19: chest x-ray: No acute bone abnormality. No osteomyelitis is noted.  NO NEW EXAMS.    LABS REVIEWED PREVIOUS  06-10-19: wbc 5.9; hgb 11.6; hct 37.2; mcv 102.8 plt 237 glucose 91; bun 50; creat 1.05 ;k+ 4.4; na++ 141; ca 9.1 liver normal albumin 3.5 07-04-19: wbc 10.4; hgb 11.5; hct 36.6; mcv 103.1 plt 214; glucose 190; bun 97; creat 2.41; k+ 5.3; na++ 141; ca 9.0 ast 42; alt 65; alk phos 158; albumin 3.2   07-04-19: vit B 12: 1014; iron 23;tibc 260 tsh 1.382 free T4: 1.66 urine culture: e-coli  07-15-19: glucose 102; bun 71; creat 1.45; k+ 4.7; na++ 139; ca 9.0  07-26-19: tsh 2.447 10-31-19: wbc 7.6; hg 11.8; hct 38.5; mcv 104.6 plt 311; glucose 156; bun 68; creat 1.31; k+ 4.0; na++ 139; ca 9.2  NO NEW LABS.   Review of Systems  Unable to perform ROS: Dementia (unable to participate )    Physical Exam Constitutional:      General: She is not in acute distress.    Appearance: She is well-developed and well-nourished. She is not diaphoretic.  Neck:     Thyroid: No thyromegaly.  Cardiovascular:     Rate and Rhythm: Normal rate and regular rhythm.     Pulses: Intact distal pulses.     Heart sounds: Normal heart sounds.     Comments: Unable to palpate pedal  Pulmonary:     Effort: Pulmonary effort is normal. No respiratory distress.     Breath sounds: Normal breath sounds.  Abdominal:     General: Bowel sounds are normal. There is no distension.     Palpations: Abdomen is soft.     Tenderness: There is no abdominal tenderness.  Musculoskeletal:     Cervical back: Neck supple.     Right lower leg: No edema.     Left lower leg: No edema.     Comments: Right lower extremity contracture     Lymphadenopathy:     Cervical: No cervical adenopathy.  Skin:    General: Skin is warm and dry.     Comments: Right heel ulceration: 2 x 1.8 cm   Neurological:     Mental  Status: She is alert. Mental status is at baseline.  Psychiatric:        Mood and Affect: Mood and affect and mood normal.       ASSESSMENT/ PLAN:  TODAY  1. Advanced dementia 2. Stage 3b chronic kidney disease 3. Failure to thrive in adult  Will continue current medications Will continue current plan of care She continues to be followed  by hospice care Will continue to monitor her status.  MD is aware of resident's narcotic use and is in agreement with current plan of care. We will attempt to wean resident as appropriate.  Ok Edwards NP [facility]  Contact [phone] Monday through Friday 8am- 5pm  After hours call [phone]

## 2020-04-28 ENCOUNTER — Other Ambulatory Visit: Payer: Self-pay | Admitting: Adult Health

## 2020-04-28 MED ORDER — OXYCODONE HCL 5 MG PO TABS: 5.0000 mg | ORAL_TABLET | Freq: Three times a day (TID) | ORAL | 0 refills | Status: DC

## 2020-04-29 DIAGNOSIS — D649 Anemia, unspecified: Secondary | ICD-10-CM | POA: Diagnosis not present

## 2020-04-29 DIAGNOSIS — Z1159 Encounter for screening for other viral diseases: Secondary | ICD-10-CM | POA: Diagnosis not present

## 2020-05-01 DIAGNOSIS — Z1159 Encounter for screening for other viral diseases: Secondary | ICD-10-CM | POA: Diagnosis not present

## 2020-05-01 DIAGNOSIS — D649 Anemia, unspecified: Secondary | ICD-10-CM | POA: Diagnosis not present

## 2020-05-04 DIAGNOSIS — D649 Anemia, unspecified: Secondary | ICD-10-CM | POA: Diagnosis not present

## 2020-05-04 DIAGNOSIS — Z1159 Encounter for screening for other viral diseases: Secondary | ICD-10-CM | POA: Diagnosis not present

## 2020-05-06 DIAGNOSIS — D649 Anemia, unspecified: Secondary | ICD-10-CM | POA: Diagnosis not present

## 2020-05-06 DIAGNOSIS — Z1159 Encounter for screening for other viral diseases: Secondary | ICD-10-CM | POA: Diagnosis not present

## 2020-05-08 ENCOUNTER — Non-Acute Institutional Stay (SKILLED_NURSING_FACILITY): Payer: Medicare Other | Admitting: Adult Health

## 2020-05-08 DIAGNOSIS — D649 Anemia, unspecified: Secondary | ICD-10-CM | POA: Diagnosis not present

## 2020-05-08 DIAGNOSIS — K5909 Other constipation: Secondary | ICD-10-CM | POA: Diagnosis not present

## 2020-05-08 DIAGNOSIS — R32 Unspecified urinary incontinence: Secondary | ICD-10-CM

## 2020-05-08 DIAGNOSIS — E039 Hypothyroidism, unspecified: Secondary | ICD-10-CM | POA: Diagnosis not present

## 2020-05-08 DIAGNOSIS — Z1159 Encounter for screening for other viral diseases: Secondary | ICD-10-CM | POA: Diagnosis not present

## 2020-05-11 DIAGNOSIS — D649 Anemia, unspecified: Secondary | ICD-10-CM | POA: Diagnosis not present

## 2020-05-11 DIAGNOSIS — Z1159 Encounter for screening for other viral diseases: Secondary | ICD-10-CM | POA: Diagnosis not present

## 2020-05-12 ENCOUNTER — Encounter: Payer: Self-pay | Admitting: Adult Health

## 2020-05-12 NOTE — Progress Notes (Signed)
Location:  [place] Room Number: 092 Place of Service:  SNF (31)   CODE STATUS: dnr  Allergies  Allergen Reactions  . Cephalexin Other (See Comments)    Pt. Doesn't remember.   . Cephalexin Other (See Comments)    Pt. Doesn't remember.   . Penicillins Other (See Comments)    Pt. Cant remember.     Chief Complaint  Patient presents with  . Medical Management of Chronic Issues          Acquired hypothyroidism:   Urinary incontinence  Chronic constipation    HPI:  She is a 87 year old long term resident of this facility being seen for the management of her chronic illnesses: Acquired hypothyroidism:   Urinary incontinence  Chronic constipation. There are no reports of constipation; no heart burn; no uncontrolled pain; no anxiety.   Past Medical History:  Diagnosis Date  . Thyroid disease     Past Surgical History:  Procedure Laterality Date  . ABDOMINAL HYSTERECTOMY    . TONSILLECTOMY    . TOTAL HIP ARTHROPLASTY     left    Social History   Socioeconomic History  . Marital status: Widowed    Spouse name: Not on file  . Number of children: Not on file  . Years of education: Not on file  . Highest education level: Not on file  Occupational History  . Occupation: retired   Tobacco Use  . Smoking status: Never Smoker  . Smokeless tobacco: Never Used  Vaping Use  . Vaping Use: Never used  Substance and Sexual Activity  . Alcohol use: No  . Drug use: No  . Sexual activity: Not Currently  Other Topics Concern  . Not on file  Social History Narrative   Long term resident of [facility] .   Social Determinants of Health   Financial Resource Strain: Not on file  Food Insecurity: Not on file  Transportation Needs: Not on file  Physical Activity: Not on file  Stress: Not on file  Social Connections: Not on file  Intimate Partner Violence: Not on file   Family History  Problem Relation Age of Onset  . Heart attack Mother   . Heart  attack Father   . Hypertension Father       VITAL SIGNS BP 125/76   Pulse 70   Temp 97.6 F (36.4 C)   Ht 5' 1"  (1.549 m)   Wt 124 lb (56.2 kg)   BMI 23.43 kg/m   Outpatient Encounter Medications as of 05/08/2020  Medication Sig  . acetaminophen (TYLENOL) 650 MG CR tablet Take 650 mg by mouth every 6 (six) hours.  Roseanne Kaufman [employer] [facility]) OINT Special Instructions: Apply to sacrum and bilateral buttocks qshift for prevention. Every Shift Day, Evening, Night  . feeding supplement, ENSURE ENLIVE, (ENSURE ENLIVE) LIQD Take 237 mLs by mouth with breakfast, with lunch, and with evening meal.   . hydrocortisone cream 1 % Apply 1 application topically daily.  Marland Kitchen levothyroxine (SYNTHROID) 100 MCG tablet Take 100 mcg by mouth daily before breakfast.  . loratadine (CLARITIN) 10 MG tablet Take 10 mg by mouth daily. Chronic Allergies  . NON FORMULARY Regular diet once a day  . oxyCODONE (OXY IR/ROXICODONE) 5 MG immediate release tablet Take 1 tablet (5 mg total) by mouth every 8 (eight) hours.  . sennosides-docusate sodium (SENOKOT-S) 8.6-50 MG tablet Take 1 tablet by mouth daily.  . silver nitrate applicators 95-74 % applicator Apply 1  application topically daily as needed. Special Instructions: Apply silver nitrate stick to rolled borders of right heel wound daily prn epithelialization and rolled borders of wound. Once A Day - PRN PRN 1  . Vitamins A & D (VITAMIN A & D) ointment Apply 1 application topically in the morning, at noon, and at bedtime. Apply to left heel each shift   No facility-administered encounter medications on file as of 05/08/2020.     SIGNIFICANT DIAGNOSTIC EXAMS   PREVIOUS  07-04-19: acute abdomen x-ray: No evidence of bowel obstruction or free air. Cardiomegaly  No acute cardiopulmonary disease.  08-23-19: chest x-ray: No acute bone abnormality. No osteomyelitis is noted.  NO NEW EXAMS.    LABS REVIEWED PREVIOUS  06-10-19: wbc 5.9; hgb 11.6; hct  37.2; mcv 102.8 plt 237 glucose 91; bun 50; creat 1.05 ;k+ 4.4; na++ 141; ca 9.1 liver normal albumin 3.5 07-04-19: wbc 10.4; hgb 11.5; hct 36.6; mcv 103.1 plt 214; glucose 190; bun 97; creat 2.41; k+ 5.3; na++ 141; ca 9.0 ast 42; alt 65; alk phos 158; albumin 3.2   07-04-19: vit B 12: 1014; iron 23;tibc 260 tsh 1.382 free T4: 1.66 urine culture: e-coli  07-15-19: glucose 102; bun 71; creat 1.45; k+ 4.7; na++ 139; ca 9.0  07-26-19: tsh 2.447 10-31-19: wbc 7.6; hg 11.8; hct 38.5; mcv 104.6 plt 311; glucose 156; bun 68; creat 1.31; k+ 4.0; na++ 139; ca 9.2  NO NEW LABS.   Review of Systems  Unable to perform ROS: Dementia (unable to participate )   Physical Exam Constitutional:      General: She is not in acute distress.    Appearance: She is well-developed and well-nourished. She is not diaphoretic.  Neck:     Thyroid: No thyromegaly.  Cardiovascular:     Rate and Rhythm: Normal rate and regular rhythm.     Pulses: Intact distal pulses.     Heart sounds: Normal heart sounds.     Comments: Pp faint Pulmonary:     Effort: Pulmonary effort is normal. No respiratory distress.     Breath sounds: Normal breath sounds.  Abdominal:     General: Bowel sounds are normal. There is no distension.     Palpations: Abdomen is soft.     Tenderness: There is no abdominal tenderness.  Musculoskeletal:        General: No edema.     Cervical back: Neck supple.     Right lower leg: No edema.     Left lower leg: No edema.     Comments: Right lower extremity contracture      Lymphadenopathy:     Cervical: No cervical adenopathy.  Skin:    General: Skin is warm and dry.  Neurological:     Mental Status: She is alert. Mental status is at baseline.  Psychiatric:        Mood and Affect: Mood and affect and mood normal.        ASSESSMENT/ PLAN:  TODAY  1. Acquired hypothyroidism: is stable tsh 2.447 will continue synthroid 100 mcg daily   2. Urinary incontinence is stable will monitor  3.  Chronic constipation: is stable will continue colace twice daily   PREVIOUS  4. Pressure ulcer of right heel stage 3: is stable will continue current wound care and will monitor her status. Will continue supplements as directed   5. Hypokalemia is stable k+ 4.0 will monitor   6. CKD stage 3b: is without change bun 68; creat 1.37 will  monitor   7. Advanced dementia: is without change: weight is 124 pounds; which is stable; will continue to monitor her status.   8. Chronic generalized pain: is stable will continue oxycodone 5 mg every 8 hours routinely   9. Failure to thrive in adult: is without change: weight is 124 pounds. Will monitor her status will continue supplements as directed. She is unable to tolerate remeron.         MD is aware of resident's narcotic use and is in agreement with current plan of care. We will attempt to wean resident as appropriate.  Ok Edwards NP [facility]  Contact [phone] Monday through Friday 8am- 5pm  After hours call [phone]

## 2020-05-13 DIAGNOSIS — D649 Anemia, unspecified: Secondary | ICD-10-CM | POA: Diagnosis not present

## 2020-05-13 DIAGNOSIS — Z1159 Encounter for screening for other viral diseases: Secondary | ICD-10-CM | POA: Diagnosis not present

## 2020-05-15 DIAGNOSIS — D649 Anemia, unspecified: Secondary | ICD-10-CM | POA: Diagnosis not present

## 2020-05-15 DIAGNOSIS — Z1159 Encounter for screening for other viral diseases: Secondary | ICD-10-CM | POA: Diagnosis not present

## 2020-05-18 DIAGNOSIS — Z1159 Encounter for screening for other viral diseases: Secondary | ICD-10-CM | POA: Diagnosis not present

## 2020-05-18 DIAGNOSIS — D649 Anemia, unspecified: Secondary | ICD-10-CM | POA: Diagnosis not present

## 2020-05-19 ENCOUNTER — Other Ambulatory Visit: Payer: Self-pay | Admitting: Adult Health

## 2020-05-19 MED ORDER — OXYCODONE HCL 5 MG PO TABS: 5.0000 mg | ORAL_TABLET | Freq: Three times a day (TID) | ORAL | 0 refills | Status: DC

## 2020-05-20 DIAGNOSIS — D649 Anemia, unspecified: Secondary | ICD-10-CM | POA: Diagnosis not present

## 2020-05-20 DIAGNOSIS — Z1159 Encounter for screening for other viral diseases: Secondary | ICD-10-CM | POA: Diagnosis not present

## 2020-05-22 DIAGNOSIS — Z1159 Encounter for screening for other viral diseases: Secondary | ICD-10-CM | POA: Diagnosis not present

## 2020-05-22 DIAGNOSIS — D649 Anemia, unspecified: Secondary | ICD-10-CM | POA: Diagnosis not present

## 2020-05-25 DIAGNOSIS — Z1159 Encounter for screening for other viral diseases: Secondary | ICD-10-CM | POA: Diagnosis not present

## 2020-05-25 DIAGNOSIS — D649 Anemia, unspecified: Secondary | ICD-10-CM | POA: Diagnosis not present

## 2020-05-27 DIAGNOSIS — D649 Anemia, unspecified: Secondary | ICD-10-CM | POA: Diagnosis not present

## 2020-05-27 DIAGNOSIS — Z1159 Encounter for screening for other viral diseases: Secondary | ICD-10-CM | POA: Diagnosis not present

## 2020-06-05 ENCOUNTER — Encounter: Payer: Self-pay | Admitting: Adult Health

## 2020-06-05 ENCOUNTER — Non-Acute Institutional Stay (SKILLED_NURSING_FACILITY): Payer: Medicare Other | Admitting: Adult Health

## 2020-06-05 DIAGNOSIS — G8929 Other chronic pain: Secondary | ICD-10-CM

## 2020-06-05 DIAGNOSIS — N1832 Chronic kidney disease, stage 3b: Secondary | ICD-10-CM | POA: Diagnosis not present

## 2020-06-05 DIAGNOSIS — R52 Pain, unspecified: Secondary | ICD-10-CM | POA: Diagnosis not present

## 2020-06-05 DIAGNOSIS — F0390 Unspecified dementia without behavioral disturbance: Secondary | ICD-10-CM | POA: Diagnosis not present

## 2020-06-05 DIAGNOSIS — R69 Illness, unspecified: Secondary | ICD-10-CM | POA: Diagnosis not present

## 2020-06-05 DIAGNOSIS — F03C0 Unspecified dementia, severe, without behavioral disturbance, psychotic disturbance, mood disturbance, and anxiety: Secondary | ICD-10-CM

## 2020-06-05 NOTE — Progress Notes (Signed)
Location:  [place] Room Number: 315 Place of Service:  SNF (31)   CODE STATUS: dnr  Allergies  Allergen Reactions  . Cephalexin Other (See Comments)    Pt. Doesn't remember.   . Cephalexin Other (See Comments)    Pt. Doesn't remember.   . Penicillins Other (See Comments)    Pt. Cant remember.     Chief Complaint  Patient presents with  . Medical Management of Chronic Issues           CKD stage 3b  Advanced dementia:  Chronic generalized pain:    HPI:  She is a 87 year old long term resident of this facility being seen for the management of her chronic illnesses:CKD stage 3b  Advanced dementia:  Chronic generalized pain. She continues to be followed by hospice care. She does spend most of her time in her room. She is slowly losing weight with her current weight is 120 pounds. Her pain is presently being managed with her current regimen.    Past Medical History:  Diagnosis Date  . Thyroid disease     Past Surgical History:  Procedure Laterality Date  . ABDOMINAL HYSTERECTOMY    . TONSILLECTOMY    . TOTAL HIP ARTHROPLASTY     left    Social History   Socioeconomic History  . Marital status: Widowed    Spouse name: Not on file  . Number of children: Not on file  . Years of education: Not on file  . Highest education level: Not on file  Occupational History  . Occupation: retired   Tobacco Use  . Smoking status: Never Smoker  . Smokeless tobacco: Never Used  Vaping Use  . Vaping Use: Never used  Substance and Sexual Activity  . Alcohol use: No  . Drug use: No  . Sexual activity: Not Currently  Other Topics Concern  . Not on file  Social History Narrative   Long term resident of [facility] .   Social Determinants of Health   Financial Resource Strain: Not on file  Food Insecurity: Not on file  Transportation Needs: Not on file  Physical Activity: Not on file  Stress: Not on file  Social Connections: Not on file  Intimate Partner  Violence: Not on file   Family History  Problem Relation Age of Onset  . Heart attack Mother   . Heart attack Father   . Hypertension Father       VITAL SIGNS BP 116/74   Pulse 68   Temp 97.6 F (36.4 C)   Ht 5' 1"  (1.549 m)   Wt 120 lb 6.4 oz (54.6 kg)   BMI 22.75 kg/m   Outpatient Encounter Medications as of 06/05/2020  Medication Sig  . acetaminophen (TYLENOL) 650 MG CR tablet Take 650 mg by mouth every 6 (six) hours.  Roseanne Kaufman [employer] [facility]) OINT Special Instructions: Apply to sacrum and bilateral buttocks qshift for prevention. Every Shift Day, Evening, Night  . feeding supplement, ENSURE ENLIVE, (ENSURE ENLIVE) LIQD Take 237 mLs by mouth with breakfast, with lunch, and with evening meal.   . hydrocortisone cream 1 % Apply 1 application topically daily.  Marland Kitchen levothyroxine (SYNTHROID) 100 MCG tablet Take 100 mcg by mouth daily before breakfast.  . loratadine (CLARITIN) 10 MG tablet Take 10 mg by mouth daily. Chronic Allergies  . NON FORMULARY Regular diet once a day  . oxyCODONE (OXY IR/ROXICODONE) 5 MG immediate release tablet Take 1 tablet (5 mg  total) by mouth every 8 (eight) hours.  . sennosides-docusate sodium (SENOKOT-S) 8.6-50 MG tablet Take 1 tablet by mouth daily.  . silver nitrate applicators 58-09 % applicator Apply 1 application topically daily as needed. Special Instructions: Apply silver nitrate stick to rolled borders of right heel wound daily prn epithelialization and rolled borders of wound. Once A Day - PRN PRN 1  . Vitamins A & D (VITAMIN A & D) ointment Apply 1 application topically in the morning, at noon, and at bedtime. Apply to left heel each shift   No facility-administered encounter medications on file as of 06/05/2020.     SIGNIFICANT DIAGNOSTIC EXAMS   PREVIOUS  07-04-19: acute abdomen x-ray: No evidence of bowel obstruction or free air. Cardiomegaly  No acute cardiopulmonary disease.  08-23-19: chest x-ray: No acute bone  abnormality. No osteomyelitis is noted.  NO NEW EXAMS.    LABS REVIEWED PREVIOUS  06-10-19: wbc 5.9; hgb 11.6; hct 37.2; mcv 102.8 plt 237 glucose 91; bun 50; creat 1.05 ;k+ 4.4; na++ 141; ca 9.1 liver normal albumin 3.5 07-04-19: wbc 10.4; hgb 11.5; hct 36.6; mcv 103.1 plt 214; glucose 190; bun 97; creat 2.41; k+ 5.3; na++ 141; ca 9.0 ast 42; alt 65; alk phos 158; albumin 3.2   07-04-19: vit B 12: 1014; iron 23;tibc 260 tsh 1.382 free T4: 1.66 urine culture: e-coli  07-15-19: glucose 102; bun 71; creat 1.45; k+ 4.7; na++ 139; ca 9.0  07-26-19: tsh 2.447 10-31-19: wbc 7.6; hg 11.8; hct 38.5; mcv 104.6 plt 311; glucose 156; bun 68; creat 1.31; k+ 4.0; na++ 139; ca 9.2  NO NEW LABS.   Review of Systems  Unable to perform ROS: Dementia (unable to participate )   Physical Exam Constitutional:      General: She is not in acute distress.    Appearance: She is well-developed and well-nourished. She is not diaphoretic.  Neck:     Thyroid: No thyromegaly.  Cardiovascular:     Rate and Rhythm: Normal rate and regular rhythm.     Pulses: Intact distal pulses.     Heart sounds: Normal heart sounds.     Comments: PP faint  Pulmonary:     Effort: Pulmonary effort is normal. No respiratory distress.     Breath sounds: Normal breath sounds.  Abdominal:     General: Bowel sounds are normal. There is no distension.     Palpations: Abdomen is soft.     Tenderness: There is no abdominal tenderness.  Musculoskeletal:        General: No edema.     Cervical back: Neck supple.     Right lower leg: No edema.     Left lower leg: No edema.     Comments: Right lower extremity contracture     Lymphadenopathy:     Cervical: No cervical adenopathy.  Skin:    General: Skin is warm and dry.  Neurological:     Mental Status: She is alert. Mental status is at baseline.  Psychiatric:        Mood and Affect: Mood and affect and mood normal.    ASSESSMENT/ PLAN:  TODAY  1. CKD stage 3b without change bun  68; creat 1.37 will monitor   2. Advanced dementia: without change weight is 120 pounds; she continues to slowly weight. This is an unfortunate outcome but is expected at the endstage of this disease.   3. Chronic generalized pain: is stable will continue oxycodone 5 mg every 8 hours.  PREVIOUS  4. Pressure ulcer of right heel stage 3: is stable will continue current wound care and will monitor her status. Will continue supplements as directed   5. Hypokalemia is stable k+ 4.0 will monitor   6. Failure to thrive in adult: is without change: weight is 120 pounds. Will monitor her status will continue supplements as directed. She is unable to tolerate remeron.   7. Acquired hypothyroidism: is stable tsh 2.447 will continue synthroid 100 mcg daily   8. Urinary incontinence is stable will monitor  9. Chronic constipation: is stable will continue colace twice daily         MD is aware of resident's narcotic use and is in agreement with current plan of care. We will attempt to wean resident as appropriate.  Ok Edwards NP [facility]  Contact [phone] Monday through Friday 8am- 5pm  After hours call [phone]

## 2020-06-11 ENCOUNTER — Other Ambulatory Visit: Payer: Self-pay | Admitting: Adult Health

## 2020-06-11 MED ORDER — OXYCODONE HCL 5 MG PO TABS: 5.0000 mg | ORAL_TABLET | Freq: Three times a day (TID) | ORAL | 0 refills | Status: DC

## 2020-07-08 ENCOUNTER — Non-Acute Institutional Stay (SKILLED_NURSING_FACILITY): Payer: Medicare Other | Admitting: Adult Health

## 2020-07-08 ENCOUNTER — Encounter: Payer: Self-pay | Admitting: Adult Health

## 2020-07-08 DIAGNOSIS — E039 Hypothyroidism, unspecified: Secondary | ICD-10-CM

## 2020-07-08 DIAGNOSIS — R32 Unspecified urinary incontinence: Secondary | ICD-10-CM | POA: Diagnosis not present

## 2020-07-08 DIAGNOSIS — R52 Pain, unspecified: Secondary | ICD-10-CM

## 2020-07-08 DIAGNOSIS — L89613 Pressure ulcer of right heel, stage 3: Secondary | ICD-10-CM

## 2020-07-08 DIAGNOSIS — N1832 Chronic kidney disease, stage 3b: Secondary | ICD-10-CM | POA: Diagnosis not present

## 2020-07-08 DIAGNOSIS — E876 Hypokalemia: Secondary | ICD-10-CM | POA: Diagnosis not present

## 2020-07-08 DIAGNOSIS — F03C0 Unspecified dementia, severe, without behavioral disturbance, psychotic disturbance, mood disturbance, and anxiety: Secondary | ICD-10-CM

## 2020-07-08 DIAGNOSIS — F0390 Unspecified dementia without behavioral disturbance: Secondary | ICD-10-CM

## 2020-07-08 DIAGNOSIS — R627 Adult failure to thrive: Secondary | ICD-10-CM

## 2020-07-08 DIAGNOSIS — R69 Illness, unspecified: Secondary | ICD-10-CM | POA: Diagnosis not present

## 2020-07-08 DIAGNOSIS — G8929 Other chronic pain: Secondary | ICD-10-CM

## 2020-07-08 NOTE — Progress Notes (Signed)
Location:  [place] Room Number: 145/W Place of Service:  SNF (31)   CODE STATUS: DNR  Allergies  Allergen Reactions  . Cephalexin Other (See Comments)    Pt. Doesn't remember.   . Cephalexin Other (See Comments)    Pt. Doesn't remember.   . Penicillins Other (See Comments)    Pt. Cant remember.     Chief Complaint  Patient presents with  . Annual Exam    Annual Exam    HPI:  She is a 87 year old long term resident of this facility being seen for her annual exam. She continues to be followed by hospice care. There have been no hospitalizations over the past; and no visits to the ED. She continues to long term chronic oxycodone for her pain management. She has not had a facial rash since her makeup has been removed. There are on reports of anxiety or agitation. She continues to be followed for her chronic illnesses including: Hypokalemia     Failure to thrive in adult:   Acquired hypothyroidism:  Past Medical History:  Diagnosis Date  . Thyroid disease     Past Surgical History:  Procedure Laterality Date  . ABDOMINAL HYSTERECTOMY    . TONSILLECTOMY    . TOTAL HIP ARTHROPLASTY     left    Social History   Socioeconomic History  . Marital status: Widowed    Spouse name: Not on file  . Number of children: Not on file  . Years of education: Not on file  . Highest education level: Not on file  Occupational History  . Occupation: retired   Tobacco Use  . Smoking status: Never Smoker  . Smokeless tobacco: Never Used  Vaping Use  . Vaping Use: Never used  Substance and Sexual Activity  . Alcohol use: No  . Drug use: No  . Sexual activity: Not Currently  Other Topics Concern  . Not on file  Social History Narrative   Long term resident of [facility] .   Social Determinants of Health   Financial Resource Strain: Not on file  Food Insecurity: Not on file  Transportation Needs: Not on file  Physical Activity: Not on file  Stress: Not on  file  Social Connections: Not on file  Intimate Partner Violence: Not on file   Family History  Problem Relation Age of Onset  . Heart attack Mother   . Heart attack Father   . Hypertension Father       VITAL SIGNS BP 112/60   Pulse 60   Temp (!) 97 F (36.1 C)   Ht 5' 1"  (1.549 m)   Wt 124 lb 9.6 oz (56.5 kg)   BMI 23.54 kg/m   Outpatient Encounter Medications as of 07/08/2020  Medication Sig  . acetaminophen (TYLENOL) 650 MG CR tablet Take 650 mg by mouth every 6 (six) hours.  Roseanne Kaufman [employer] [facility]) OINT Special Instructions: Apply to sacrum and bilateral buttocks qshift for prevention. Every Shift Day, Evening, Night  . Casanthranol-Docusate Sodium (STOOL SOFTENER/STIMULANT LAXAT PO) Take 1 tablet by mouth daily in the afternoon.  . feeding supplement, ENSURE ENLIVE, (ENSURE ENLIVE) LIQD Take 237 mLs by mouth daily in the afternoon.  . hydrocortisone cream 1 % Apply 1 application topically daily.  Marland Kitchen levothyroxine (SYNTHROID) 100 MCG tablet Take 100 mcg by mouth daily before breakfast.  . loratadine (CLARITIN) 10 MG tablet Take 10 mg by mouth daily. Chronic Allergies  . NON FORMULARY Regular diet  once a day  . oxyCODONE (OXY IR/ROXICODONE) 5 MG immediate release tablet Take 1 tablet (5 mg total) by mouth every 8 (eight) hours.  . Vitamins A & D (VITAMIN A & D) ointment Apply 1 application topically in the morning, at noon, and at bedtime. Apply to left and right heel for prevention each shift  . [DISCONTINUED] sennosides-docusate sodium (SENOKOT-S) 8.6-50 MG tablet Take 1 tablet by mouth daily.  . [DISCONTINUED] silver nitrate applicators 01-60 % applicator Apply 1 application topically daily as needed. Special Instructions: Apply silver nitrate stick to rolled borders of right heel wound daily prn epithelialization and rolled borders of wound. Once A Day - PRN PRN 1   No facility-administered encounter medications on file as of 07/08/2020.     SIGNIFICANT  DIAGNOSTIC EXAMS   PREVIOUS  07-04-19: acute abdomen x-ray: No evidence of bowel obstruction or free air. Cardiomegaly  No acute cardiopulmonary disease.  08-23-19: chest x-ray: No acute bone abnormality. No osteomyelitis is noted.  NO NEW EXAMS.    LABS REVIEWED PREVIOUS  06-10-19: wbc 5.9; hgb 11.6; hct 37.2; mcv 102.8 plt 237 glucose 91; bun 50; creat 1.05 ;k+ 4.4; na++ 141; ca 9.1 liver normal albumin 3.5 07-04-19: wbc 10.4; hgb 11.5; hct 36.6; mcv 103.1 plt 214; glucose 190; bun 97; creat 2.41; k+ 5.3; na++ 141; ca 9.0 ast 42; alt 65; alk phos 158; albumin 3.2   07-04-19: vit B 12: 1014; iron 23;tibc 260 tsh 1.382 free T4: 1.66 urine culture: e-coli  07-15-19: glucose 102; bun 71; creat 1.45; k+ 4.7; na++ 139; ca 9.0  07-26-19: tsh 2.447 10-31-19: wbc 7.6; hg 11.8; hct 38.5; mcv 104.6 plt 311; glucose 156; bun 68; creat 1.31; k+ 4.0; na++ 139; ca 9.2  NO NEW LABS.   Review of Systems  Unable to perform ROS: Dementia (unable to participate )   Physical Exam Constitutional:      General: She is not in acute distress.    Appearance: She is well-developed. She is not diaphoretic.  HENT:     Right Ear: External ear normal.     Left Ear: External ear normal.     Nose: Nose normal.     Mouth/Throat:     Mouth: Mucous membranes are moist.     Pharynx: Oropharynx is clear.  Eyes:     Conjunctiva/sclera: Conjunctivae normal.  Neck:     Thyroid: No thyromegaly.  Cardiovascular:     Rate and Rhythm: Normal rate and regular rhythm.     Pulses: Normal pulses.     Heart sounds: Normal heart sounds.  Pulmonary:     Effort: Pulmonary effort is normal. No respiratory distress.     Breath sounds: Normal breath sounds.  Abdominal:     General: Bowel sounds are normal. There is no distension.     Palpations: Abdomen is soft.     Tenderness: There is no abdominal tenderness.  Musculoskeletal:        General: Normal range of motion.     Cervical back: Neck supple.     Right lower leg: No  edema.     Left lower leg: No edema.  Lymphadenopathy:     Cervical: No cervical adenopathy.  Skin:    General: Skin is warm and dry.  Neurological:     Mental Status: She is alert. Mental status is at baseline.  Psychiatric:        Mood and Affect: Mood normal.      ASSESSMENT/ PLAN:  TODAY  1. Pressure ulcer of right heel stage 3: has resolved; will monitor   2. Hypokalemia 4.0 will monitor   3. Failure to thrive in adult: her weight is stable at 124 pounds; will continue supplements as directed; she is unable to take remeron.   4. Acquired hypothyroidism: is stable tsh 2.447 will continue synthroid 100 mcg daily   5. Urinary incontinence; is mostly unaware; will monitor   6. Chronic constipation will continue colace twice daily   7. CKD stage 3b stable bun 68; creat 1.37 will monitor  8. Advanced dementia: is without change weight is stable at 124 pounds will monitor   9. Chronic generalized pain; is stable will continue oxycodone 5 mg every 8 hours  Will check cbc cmp tsh.     Ok Edwards NP [facility]  Contact [phone] Monday through Friday 8am- 5pm  After hours call [phone]

## 2020-07-13 ENCOUNTER — Other Ambulatory Visit: Payer: Self-pay | Admitting: Adult Health

## 2020-07-13 MED ORDER — OXYCODONE HCL 5 MG PO TABS: 5.0000 mg | ORAL_TABLET | Freq: Three times a day (TID) | ORAL | 0 refills | Status: DC

## 2020-07-16 ENCOUNTER — Other Ambulatory Visit (HOSPITAL_COMMUNITY)
Admission: RE | Admit: 2020-07-16 | Discharge: 2020-07-16 | Disposition: A | Discharge status: Home / Self Care | Payer: Medicare HMO | Source: Skilled Nursing Facility | Attending: Adult Health | Admitting: Adult Health

## 2020-07-16 DIAGNOSIS — N1832 Chronic kidney disease, stage 3b: Secondary | ICD-10-CM | POA: Diagnosis present

## 2020-07-16 LAB — COMPREHENSIVE METABOLIC PANEL
ALT: 113 U/L — ABNORMAL HIGH (ref 0–44)
AST: 76 U/L — ABNORMAL HIGH (ref 15–41)
Albumin: 3.1 g/dL — ABNORMAL LOW (ref 3.5–5.0)
Alkaline Phosphatase: 598 U/L — ABNORMAL HIGH (ref 38–126)
Anion gap: 11 (ref 5–15)
BUN: 63 mg/dL — ABNORMAL HIGH (ref 8–23)
CO2: 25 mmol/L (ref 22–32)
Calcium: 9.3 mg/dL (ref 8.9–10.3)
Chloride: 104 mmol/L (ref 98–111)
Creatinine, Ser: 1.2 mg/dL — ABNORMAL HIGH (ref 0.44–1.00)
GFR, Estimated: 41 mL/min — ABNORMAL LOW (ref >60)
Glucose, Bld: 84 mg/dL (ref 70–99)
Potassium: 4.6 mmol/L (ref 3.5–5.1)
Sodium: 140 mmol/L (ref 135–145)
Total Bilirubin: 0.5 mg/dL (ref 0.3–1.2)
Total Protein: 6.4 g/dL — ABNORMAL LOW (ref 6.5–8.1)

## 2020-07-16 LAB — CBC
HCT: 36.9 % (ref 36.0–46.0)
Hemoglobin: 11.4 g/dL — ABNORMAL LOW (ref 12.0–15.0)
MCH: 32.7 pg (ref 26.0–34.0)
MCHC: 30.9 g/dL (ref 30.0–36.0)
MCV: 105.7 fL — ABNORMAL HIGH (ref 80.0–100.0)
Platelets: 234 10*3/uL (ref 150–400)
RBC: 3.49 MIL/uL — ABNORMAL LOW (ref 3.87–5.11)
RDW: 14.3 % (ref 11.5–15.5)
WBC: 4.3 10*3/uL (ref 4.0–10.5)
nRBC: 0 % (ref 0.0–0.2)

## 2020-07-16 LAB — TSH: TSH: 1.874 u[IU]/mL (ref 0.350–4.500)

## 2020-07-23 ENCOUNTER — Non-Acute Institutional Stay (SKILLED_NURSING_FACILITY): Payer: Medicare Other | Admitting: Adult Health

## 2020-07-23 ENCOUNTER — Encounter: Payer: Self-pay | Admitting: Adult Health

## 2020-07-23 DIAGNOSIS — R627 Adult failure to thrive: Secondary | ICD-10-CM

## 2020-07-23 DIAGNOSIS — N1832 Chronic kidney disease, stage 3b: Secondary | ICD-10-CM | POA: Diagnosis not present

## 2020-07-23 DIAGNOSIS — F03C0 Unspecified dementia, severe, without behavioral disturbance, psychotic disturbance, mood disturbance, and anxiety: Secondary | ICD-10-CM

## 2020-07-23 DIAGNOSIS — R69 Illness, unspecified: Secondary | ICD-10-CM | POA: Diagnosis not present

## 2020-07-23 DIAGNOSIS — F0390 Unspecified dementia without behavioral disturbance: Secondary | ICD-10-CM | POA: Diagnosis not present

## 2020-07-23 NOTE — Progress Notes (Signed)
Location:  [place] Room Number: 947 Place of Service:  SNF (31)   CODE STATUS: dnr  Allergies  Allergen Reactions  . Cephalexin Other (See Comments)    Pt. Doesn't remember.   . Cephalexin Other (See Comments)    Pt. Doesn't remember.   . Penicillins Other (See Comments)    Pt. Cant remember.     Chief Complaint  Patient presents with  . Acute Visit    Care plan meeting.     HPI:  We have come together for her care plan meeting family present. BIMS 5/15 mood 0/30. She is followed by hospice care.  She is nonambulatory she requires extensive assist with adls. She is able to feed herself. She is incontinent of bladder; frequently incontinent of bowel. There have been no falls. Her pain is presently managed. Therapy for splinting     Weight appetite is 50-100% regular diet; supplement 124.6 pounds; which is stable.    She continues to be followed for her chronic illnesses including: Failure to thrive in adult  Advanced dementia  Stage 3b chronic kidney disease  Past Medical History:  Diagnosis Date  . Thyroid disease     Past Surgical History:  Procedure Laterality Date  . ABDOMINAL HYSTERECTOMY    . TONSILLECTOMY    . TOTAL HIP ARTHROPLASTY     left    Social History   Socioeconomic History  . Marital status: Widowed    Spouse name: Not on file  . Number of children: Not on file  . Years of education: Not on file  . Highest education level: Not on file  Occupational History  . Occupation: retired   Tobacco Use  . Smoking status: Never Smoker  . Smokeless tobacco: Never Used  Vaping Use  . Vaping Use: Never used  Substance and Sexual Activity  . Alcohol use: No  . Drug use: No  . Sexual activity: Not Currently  Other Topics Concern  . Not on file  Social History Narrative   Long term resident of [facility] .   Social Determinants of Health   Financial Resource Strain: Not on file  Food Insecurity: Not on file  Transportation  Needs: Not on file  Physical Activity: Not on file  Stress: Not on file  Social Connections: Not on file  Intimate Partner Violence: Not on file   Family History  Problem Relation Age of Onset  . Heart attack Mother   . Heart attack Father   . Hypertension Father       VITAL SIGNS BP 126/77   Pulse 66   Temp (!) 97.4 F (36.3 C)   Resp 20   Ht 5' 1"  (1.549 m)   Wt 124 lb 9.6 oz (56.5 kg)   BMI 23.54 kg/m   Outpatient Encounter Medications as of 07/23/2020  Medication Sig  . acetaminophen (TYLENOL) 650 MG CR tablet Take 650 mg by mouth every 6 (six) hours.  Roseanne Kaufman [employer] [facility]) OINT Special Instructions: Apply to sacrum and bilateral buttocks qshift for prevention. Every Shift Day, Evening, Night  . Casanthranol-Docusate Sodium (STOOL SOFTENER/STIMULANT LAXAT PO) Take 1 tablet by mouth daily in the afternoon.  . feeding supplement, ENSURE ENLIVE, (ENSURE ENLIVE) LIQD Take 237 mLs by mouth daily in the afternoon.  . hydrocortisone cream 1 % Apply 1 application topically daily.  Marland Kitchen levothyroxine (SYNTHROID) 100 MCG tablet Take 100 mcg by mouth daily before breakfast.  . loratadine (CLARITIN) 10 MG tablet Take  10 mg by mouth daily. Chronic Allergies  . NON FORMULARY Regular diet once a day  . oxyCODONE (OXY IR/ROXICODONE) 5 MG immediate release tablet Take 1 tablet (5 mg total) by mouth every 8 (eight) hours.  . Vitamins A & D (VITAMIN A & D) ointment Apply 1 application topically in the morning, at noon, and at bedtime. Apply to left and right heel for prevention each shift   No facility-administered encounter medications on file as of 07/23/2020.     SIGNIFICANT DIAGNOSTIC EXAMS  PREVIOUS  07-04-19: acute abdomen x-ray: No evidence of bowel obstruction or free air. Cardiomegaly  No acute cardiopulmonary disease.  08-23-19: chest x-ray: No acute bone abnormality. No osteomyelitis is noted.  NO NEW EXAMS.    LABS REVIEWED PREVIOUS  07-15-19: glucose 102;  bun 71; creat 1.45; k+ 4.7; na++ 139; ca 9.0  07-26-19: tsh 2.447 10-31-19: wbc 7.6; hg 11.8; hct 38.5; mcv 104.6 plt 311; glucose 156; bun 68; creat 1.31; k+ 4.0; na++ 139; ca 9.2  TODAY  07-16-20: wbc 4.3; hgb 11.4; hct 105.7 plt 234; glucose 84; bun 63; creat 1.20; k+ 4.6; na++ 140; ca 9.3 GFR 41; albumin 3.1 ast 76 alt 113; alk phos 598; tsh 1.874  Review of Systems  Unable to perform ROS: Dementia (uanble to participate )    Physical Exam Constitutional:      General: She is not in acute distress.    Appearance: She is well-developed. She is not diaphoretic.  Neck:     Thyroid: No thyromegaly.  Cardiovascular:     Rate and Rhythm: Normal rate and regular rhythm.     Pulses: Normal pulses.     Heart sounds: Normal heart sounds.  Pulmonary:     Effort: Pulmonary effort is normal. No respiratory distress.     Breath sounds: Normal breath sounds.  Abdominal:     General: Bowel sounds are normal. There is no distension.     Palpations: Abdomen is soft.     Tenderness: There is no abdominal tenderness.  Musculoskeletal:        General: Normal range of motion.     Cervical back: Neck supple.     Right lower leg: No edema.     Left lower leg: No edema.  Lymphadenopathy:     Cervical: No cervical adenopathy.  Skin:    General: Skin is warm and dry.  Neurological:     Mental Status: She is alert. Mental status is at baseline.  Psychiatric:        Mood and Affect: Mood normal.       ASSESSMENT/ PLAN:  TODAY  1. Failure to thrive in adult 2. Advanced dementia 3. Stage 3b chronic kidney disease  Will continue current medications Will continue current plan of care Will continue to monitor her status.  She continues to be followed by hospice care   Time spent with patient: 40 minutes:>50%  coordination of care to include goals of care (hospice); medications    Ok Edwards NP [facility]  Contact [phone] Monday through Friday 8am- 5pm  After hours  call [phone]

## 2020-07-28 ENCOUNTER — Other Ambulatory Visit: Payer: Self-pay | Admitting: Adult Health

## 2020-07-29 ENCOUNTER — Other Ambulatory Visit: Payer: Self-pay | Admitting: Adult Health

## 2020-08-05 ENCOUNTER — Non-Acute Institutional Stay (SKILLED_NURSING_FACILITY): Payer: Medicare Other | Admitting: Adult Health

## 2020-08-05 ENCOUNTER — Encounter: Payer: Self-pay | Admitting: Adult Health

## 2020-08-05 DIAGNOSIS — F419 Anxiety disorder, unspecified: Secondary | ICD-10-CM

## 2020-08-05 DIAGNOSIS — F0390 Unspecified dementia without behavioral disturbance: Secondary | ICD-10-CM

## 2020-08-05 DIAGNOSIS — R69 Illness, unspecified: Secondary | ICD-10-CM | POA: Diagnosis not present

## 2020-08-05 DIAGNOSIS — F03C0 Unspecified dementia, severe, without behavioral disturbance, psychotic disturbance, mood disturbance, and anxiety: Secondary | ICD-10-CM

## 2020-08-05 NOTE — Progress Notes (Signed)
Location:  [place] Room Number: 145/W Place of Service:  SNF (31)   CODE STATUS: DNR  Allergies  Allergen Reactions  . Cephalexin Other (See Comments)    Pt. Doesn't remember.   . Cephalexin Other (See Comments)    Pt. Doesn't remember.   . Penicillins Other (See Comments)    Pt. Cant remember.     Chief Complaint  Patient presents with  . Acute Visit    Behavior     HPI:  She is having behavioral issues. She now requires the use of hoyer lift for transfers. She does not like this. It is causing her anxiety so she is striking out at others and is calling them names. The staff is concerned about her level of anxiety with using the hoyer lift. The staff is questioning what can be done to help her emotional state.   Past Medical History:  Diagnosis Date  . Thyroid disease     Past Surgical History:  Procedure Laterality Date  . ABDOMINAL HYSTERECTOMY    . TONSILLECTOMY    . TOTAL HIP ARTHROPLASTY     left    Social History   Socioeconomic History  . Marital status: Widowed    Spouse name: Not on file  . Number of children: Not on file  . Years of education: Not on file  . Highest education level: Not on file  Occupational History  . Occupation: retired   Tobacco Use  . Smoking status: Never Smoker  . Smokeless tobacco: Never Used  Vaping Use  . Vaping Use: Never used  Substance and Sexual Activity  . Alcohol use: No  . Drug use: No  . Sexual activity: Not Currently  Other Topics Concern  . Not on file  Social History Narrative   Long term resident of [facility] .   Social Determinants of Health   Financial Resource Strain: Not on file  Food Insecurity: Not on file  Transportation Needs: Not on file  Physical Activity: Not on file  Stress: Not on file  Social Connections: Not on file  Intimate Partner Violence: Not on file   Family History  Problem Relation Age of Onset  . Heart attack Mother   . Heart attack Father   .  Hypertension Father       VITAL SIGNS BP 119/78   Pulse 86   Temp 97.6 F (36.4 C)   Resp 20   Ht 5' 1"  (1.549 m)   Wt 125 lb 6.4 oz (56.9 kg)   BMI 23.69 kg/m   Outpatient Encounter Medications as of 08/05/2020  Medication Sig  . acetaminophen (TYLENOL) 650 MG CR tablet Take 650 mg by mouth every 6 (six) hours.  Roseanne Kaufman [employer] [facility]) OINT Special Instructions: Apply to sacrum and bilateral buttocks qshift for prevention. Every Shift Day, Evening, Night  . busPIRone (BUSPAR) 5 MG tablet Take 5 mg by mouth daily in the afternoon.  Sarajane Marek Sodium (STOOL SOFTENER/STIMULANT LAXAT PO) Take 1 tablet by mouth daily in the afternoon.  . feeding supplement, ENSURE ENLIVE, (ENSURE ENLIVE) LIQD Take 237 mLs by mouth daily in the afternoon.  . hydrocortisone cream 1 % Apply 1 application topically daily.  Marland Kitchen levothyroxine (SYNTHROID) 100 MCG tablet Take 100 mcg by mouth daily before breakfast.  . loratadine (CLARITIN) 10 MG tablet Take 10 mg by mouth daily. Chronic Allergies  . NON FORMULARY Regular diet once a day  . oxyCODONE (OXY IR/ROXICODONE) 5 MG immediate release  tablet Take 1 tablet (5 mg total) by mouth every 8 (eight) hours.  . Vitamins A & D (VITAMIN A & D) ointment Apply 1 application topically in the morning, at noon, and at bedtime. Apply to left and right heel for prevention each shift   No facility-administered encounter medications on file as of 08/05/2020.     SIGNIFICANT DIAGNOSTIC EXAMS  PREVIOUS  08-23-19: chest x-ray: No acute bone abnormality. No osteomyelitis is noted.  NO NEW EXAMS.    LABS REVIEWED PREVIOUS  10-31-19: wbc 7.6; hg 11.8; hct 38.5; mcv 104.6 plt 311; glucose 156; bun 68; creat 1.31; k+ 4.0; na++ 139; ca 9.2 07-16-20: wbc 4.3; hgb 11.4; hct 105.7 plt 234; glucose 84; bun 63; creat 1.20; k+ 4.6; na++ 140; ca 9.3 GFR 41; albumin 3.1 ast 76 alt 113; alk phos 598; tsh 1.874  NO NEW LABS   Review of Systems  Unable to  perform ROS: Dementia (unable to participate )    Physical Exam Constitutional:      General: She is not in acute distress.    Appearance: She is well-developed. She is not diaphoretic.  Neck:     Thyroid: No thyromegaly.  Cardiovascular:     Rate and Rhythm: Normal rate and regular rhythm.     Pulses: Normal pulses.     Heart sounds: Normal heart sounds.  Pulmonary:     Effort: Pulmonary effort is normal. No respiratory distress.     Breath sounds: Normal breath sounds.  Abdominal:     General: Bowel sounds are normal. There is no distension.     Palpations: Abdomen is soft.     Tenderness: There is no abdominal tenderness.  Musculoskeletal:        General: Normal range of motion.     Cervical back: Neck supple.     Right lower leg: No edema.     Left lower leg: No edema.  Lymphadenopathy:     Cervical: No cervical adenopathy.  Skin:    General: Skin is warm and dry.  Neurological:     Mental Status: She is alert. Mental status is at baseline.  Psychiatric:        Mood and Affect: Mood normal.       ASSESSMENT/ PLAN:  TODAY  1. Advanced dementia 2. Anxiety:   Is worse Will begin buspar 5 mg daily to help with anxiety and will monitor her status.     Ok Edwards NP [facility]  Contact [phone] Monday through Friday 8am- 5pm  After hours call [phone]

## 2020-08-07 ENCOUNTER — Other Ambulatory Visit: Payer: Self-pay | Admitting: Adult Health

## 2020-08-07 ENCOUNTER — Non-Acute Institutional Stay (SKILLED_NURSING_FACILITY): Payer: Medicare Other | Admitting: Adult Health

## 2020-08-07 ENCOUNTER — Encounter: Payer: Self-pay | Admitting: Adult Health

## 2020-08-07 DIAGNOSIS — R627 Adult failure to thrive: Secondary | ICD-10-CM

## 2020-08-07 DIAGNOSIS — E039 Hypothyroidism, unspecified: Secondary | ICD-10-CM | POA: Diagnosis not present

## 2020-08-07 DIAGNOSIS — E876 Hypokalemia: Secondary | ICD-10-CM | POA: Diagnosis not present

## 2020-08-07 MED ORDER — OXYCODONE HCL 5 MG PO TABS: 5.0000 mg | ORAL_TABLET | Freq: Three times a day (TID) | ORAL | 0 refills | Status: DC

## 2020-08-07 NOTE — Progress Notes (Signed)
Location:  [place] Room Number: 145/W Place of Service:  SNF (31)   CODE STATUS: dnr  Allergies  Allergen Reactions  . Cephalexin Other (See Comments)    Pt. Doesn't remember.   . Cephalexin Other (See Comments)    Pt. Doesn't remember.   . Penicillins Other (See Comments)    Pt. Cant remember.     Chief Complaint  Patient presents with  . Medical Management of Chronic Issues          Hypokalemia:  Failure to thrive in adult:  Acquired hypothyroidism     HPI:  She is a 87 year old long term resident of this facility being seen for the management of her chronic illnesses: Hypokalemia:  Failure to thrive in adult:  Acquired hypothyroidism. She continues to be followed by hospice. There are no reports of uncontrolled pain; no changes in appetite; no reports of recent anxiety.    Past Medical History:  Diagnosis Date  . Thyroid disease     Past Surgical History:  Procedure Laterality Date  . ABDOMINAL HYSTERECTOMY    . TONSILLECTOMY    . TOTAL HIP ARTHROPLASTY     left    Social History   Socioeconomic History  . Marital status: Widowed    Spouse name: Not on file  . Number of children: Not on file  . Years of education: Not on file  . Highest education level: Not on file  Occupational History  . Occupation: retired   Tobacco Use  . Smoking status: Never Smoker  . Smokeless tobacco: Never Used  Vaping Use  . Vaping Use: Never used  Substance and Sexual Activity  . Alcohol use: No  . Drug use: No  . Sexual activity: Not Currently  Other Topics Concern  . Not on file  Social History Narrative   Long term resident of [facility] .   Social Determinants of Health   Financial Resource Strain: Not on file  Food Insecurity: Not on file  Transportation Needs: Not on file  Physical Activity: Not on file  Stress: Not on file  Social Connections: Not on file  Intimate Partner Violence: Not on file   Family History  Problem Relation Age  of Onset  . Heart attack Mother   . Heart attack Father   . Hypertension Father       VITAL SIGNS BP 119/78   Pulse 86   Temp 97.6 F (36.4 C)   Resp 20   Ht 5' 1"  (1.549 m)   Wt 125 lb 6.4 oz (56.9 kg)   BMI 23.69 kg/m   Outpatient Encounter Medications as of 08/07/2020  Medication Sig  . acetaminophen (TYLENOL) 650 MG CR tablet Take 650 mg by mouth every 6 (six) hours.  Roseanne Kaufman [employer] [facility]) OINT Special Instructions: Apply to sacrum and bilateral buttocks qshift for prevention. Every Shift Day, Evening, Night  . busPIRone (BUSPAR) 5 MG tablet Take 5 mg by mouth daily in the afternoon.  Sarajane Marek Sodium (STOOL SOFTENER/STIMULANT LAXAT PO) Take 1 tablet by mouth daily in the afternoon.  . feeding supplement, ENSURE ENLIVE, (ENSURE ENLIVE) LIQD Take 237 mLs by mouth daily in the afternoon.  . hydrocortisone cream 1 % Apply 1 application topically daily.  Marland Kitchen levothyroxine (SYNTHROID) 100 MCG tablet Take 100 mcg by mouth daily before breakfast.  . loratadine (CLARITIN) 10 MG tablet Take 10 mg by mouth daily. Chronic Allergies  . NON FORMULARY Regular diet once a day  .  oxyCODONE (OXY IR/ROXICODONE) 5 MG immediate release tablet Take 1 tablet (5 mg total) by mouth every 8 (eight) hours.  . Vitamins A & D (VITAMIN A & D) ointment Apply 1 application topically in the morning, at noon, and at bedtime. Apply to left and right heel for prevention each shift   No facility-administered encounter medications on file as of 08/07/2020.     SIGNIFICANT DIAGNOSTIC EXAMS    NO NEW EXAMS.    LABS REVIEWED PREVIOUS  10-31-19: wbc 7.6; hg 11.8; hct 38.5; mcv 104.6 plt 311; glucose 156; bun 68; creat 1.31; k+ 4.0; na++ 139; ca 9.2  TODAY  07-16-20: wbc 4.3; hgb 11.4; hct 36.9; mcv 105.7 plt 234; glucose 84; bun 63; creat 1.20; k+ 4.6; na++ 140; ca 9.3 GFR 41; ast 76; alt 113; alk phos 598; albumin 3.1 tsh 1.874   Review of Systems  Unable to perform ROS:  Dementia (unable to participate )   Physical Exam Constitutional:      General: She is not in acute distress.    Appearance: She is well-developed. She is not diaphoretic.  Neck:     Thyroid: No thyromegaly.  Cardiovascular:     Rate and Rhythm: Normal rate and regular rhythm.     Pulses: Normal pulses.     Heart sounds: Normal heart sounds.  Pulmonary:     Effort: Pulmonary effort is normal. No respiratory distress.     Breath sounds: Normal breath sounds.  Abdominal:     General: Bowel sounds are normal. There is no distension.     Palpations: Abdomen is soft.     Tenderness: There is no abdominal tenderness.  Musculoskeletal:        General: Normal range of motion.     Cervical back: Neck supple.     Right lower leg: No edema.     Left lower leg: No edema.  Lymphadenopathy:     Cervical: No cervical adenopathy.  Skin:    General: Skin is warm and dry.  Neurological:     Mental Status: She is alert. Mental status is at baseline.  Psychiatric:        Mood and Affect: Mood normal.      ASSESSMENT/ PLAN:  TODAY  1. Hypokalemia: k+ 4.6 will monitor  2. Failure to thrive in adult: her weight is 125 pounds and stable albumin 3.1  will continue supplements as directed unable to tolerate remeron will monitor  3. Acquired hypothyroidism: is stable tsh 1.874 will continue synthroid 100 mcg daily  PREVIOUS   5. Urinary incontinence; is mostly unaware; will monitor   6. Chronic constipation will continue colace twice daily   7. CKD stage 3b stable bun 68; creat 1.37 will monitor  8. Advanced dementia: is without change weight is stable at 125 pounds  Will continue buspar 5 mg daily for her anxiety  will monitor   9. Chronic generalized pain; is stable will continue oxycodone 5 mg every 8 hours  10. Elevated liver enzymes: ast 76 alt 113; alk phos 598 will monitor as indicated          Ok Edwards NP [facility]  Contact [phone] Monday  through Friday 8am- 5pm  After hours call [phone]

## 2020-08-10 ENCOUNTER — Non-Acute Institutional Stay (SKILLED_NURSING_FACILITY): Payer: Medicare Other | Admitting: Adult Health

## 2020-08-10 ENCOUNTER — Encounter: Payer: Self-pay | Admitting: Adult Health

## 2020-08-10 DIAGNOSIS — L03115 Cellulitis of right lower limb: Secondary | ICD-10-CM

## 2020-08-10 NOTE — Progress Notes (Signed)
Location:  [place] Room Number: 588 Place of Service:  SNF (31)   CODE STATUS: dnr   Allergies  Allergen Reactions  . Cephalexin Other (See Comments)    Pt. Doesn't remember.   . Cephalexin Other (See Comments)    Pt. Doesn't remember.   . Penicillins Other (See Comments)    Pt. Cant remember.     Chief Complaint  Patient presents with  . Acute Visit    Right lower leg cellulitis     HPI:  She has a skin tear on her right lower leg is inflamed. Has redness; hot; painful and swollen. There are no reports of fevers present. She was started on doxycycline 100 mg twice daily over this past weekend.   Past Medical History:  Diagnosis Date  . Thyroid disease     Past Surgical History:  Procedure Laterality Date  . ABDOMINAL HYSTERECTOMY    . TONSILLECTOMY    . TOTAL HIP ARTHROPLASTY     left    Social History   Socioeconomic History  . Marital status: Widowed    Spouse name: Not on file  . Number of children: Not on file  . Years of education: Not on file  . Highest education level: Not on file  Occupational History  . Occupation: retired   Tobacco Use  . Smoking status: Never Smoker  . Smokeless tobacco: Never Used  Vaping Use  . Vaping Use: Never used  Substance and Sexual Activity  . Alcohol use: No  . Drug use: No  . Sexual activity: Not Currently  Other Topics Concern  . Not on file  Social History Narrative   Long term resident of [facility] .   Social Determinants of Health   Financial Resource Strain: Not on file  Food Insecurity: Not on file  Transportation Needs: Not on file  Physical Activity: Not on file  Stress: Not on file  Social Connections: Not on file  Intimate Partner Violence: Not on file   Family History  Problem Relation Age of Onset  . Heart attack Mother   . Heart attack Father   . Hypertension Father       VITAL SIGNS BP (!) 146/74   Pulse 80   Temp 97.7 F (36.5 C)   Resp 18   Ht 5' 1"   (1.549 m)   Wt 125 lb 6.4 oz (56.9 kg)   BMI 23.69 kg/m   Outpatient Encounter Medications as of 08/10/2020  Medication Sig  . acetaminophen (TYLENOL) 650 MG CR tablet Take 650 mg by mouth every 6 (six) hours.  Roseanne Kaufman [employer] [facility]) OINT Special Instructions: Apply to sacrum and bilateral buttocks qshift for prevention. Every Shift Day, Evening, Night  . busPIRone (BUSPAR) 5 MG tablet Take 5 mg by mouth daily in the afternoon.  Sarajane Marek Sodium (STOOL SOFTENER/STIMULANT LAXAT PO) Take 1 tablet by mouth daily in the afternoon.  . feeding supplement, ENSURE ENLIVE, (ENSURE ENLIVE) LIQD Take 237 mLs by mouth daily in the afternoon.  . hydrocortisone cream 1 % Apply 1 application topically daily.  Marland Kitchen levothyroxine (SYNTHROID) 100 MCG tablet Take 100 mcg by mouth daily before breakfast.  . loratadine (CLARITIN) 10 MG tablet Take 10 mg by mouth daily. Chronic Allergies  . NON FORMULARY Regular diet once a day  . oxyCODONE (OXY IR/ROXICODONE) 5 MG immediate release tablet Take 1 tablet (5 mg total) by mouth every 8 (eight) hours.  . Vitamins A & D (VITAMIN  A & D) ointment Apply 1 application topically in the morning, at noon, and at bedtime. Apply to left and right heel for prevention each shift   No facility-administered encounter medications on file as of 08/10/2020.     SIGNIFICANT DIAGNOSTIC EXAMS   NO NEW EXAMS.    LABS REVIEWED PREVIOUS  10-31-19: wbc 7.6; hg 11.8; hct 38.5; mcv 104.6 plt 311; glucose 156; bun 68; creat 1.31; k+ 4.0; na++ 139; ca 9.2 07-16-20: wbc 4.3; hgb 11.4; hct 36.9; mcv 105.7 plt 234; glucose 84; bun 63; creat 1.20; k+ 4.6; na++ 140; ca 9.3 GFR 41; ast 76; alt 113; alk phos 598; albumin 3.1 tsh 1.874  NO NEW LABS.   Review of Systems  Unable to perform ROS: Dementia (unable to participate)    Physical Exam Constitutional:      General: She is not in acute distress.    Appearance: She is well-developed. She is not diaphoretic.   Neck:     Thyroid: No thyromegaly.  Cardiovascular:     Rate and Rhythm: Normal rate and regular rhythm.     Pulses: Normal pulses.     Heart sounds: Normal heart sounds.  Pulmonary:     Effort: Pulmonary effort is normal. No respiratory distress.     Breath sounds: Normal breath sounds.  Abdominal:     General: Bowel sounds are normal. There is no distension.     Palpations: Abdomen is soft.     Tenderness: There is no abdominal tenderness.  Musculoskeletal:        General: Normal range of motion.     Cervical back: Neck supple.     Right lower leg: No edema.     Left lower leg: No edema.  Lymphadenopathy:     Cervical: No cervical adenopathy.  Skin:    General: Skin is warm and dry.     Comments: Right lower leg: red; hot painful and swollen  Neurological:     Mental Status: She is alert. Mental status is at baseline.  Psychiatric:        Mood and Affect: Mood normal.      ASSESSMENT/ PLAN:  TODAY  1. Right lower extremity cellulitis: is worse will continue doxycycline 100 mg twice daily through 09-18-20 will monitor her status.    Ok Edwards NP [facility]  Contact [phone] Monday through Friday 8am- 5pm  After hours call [phone]

## 2020-08-14 DIAGNOSIS — F419 Anxiety disorder, unspecified: Secondary | ICD-10-CM | POA: Insufficient documentation

## 2020-08-17 DIAGNOSIS — R748 Abnormal levels of other serum enzymes: Secondary | ICD-10-CM | POA: Insufficient documentation

## 2020-08-28 ENCOUNTER — Other Ambulatory Visit: Payer: Self-pay | Admitting: Adult Health

## 2020-08-31 ENCOUNTER — Other Ambulatory Visit: Payer: Self-pay | Admitting: Adult Health

## 2020-08-31 MED ORDER — OXYCODONE HCL 5 MG PO TABS: 5.0000 mg | ORAL_TABLET | Freq: Three times a day (TID) | ORAL | 0 refills | Status: DC

## 2020-09-02 ENCOUNTER — Non-Acute Institutional Stay (SKILLED_NURSING_FACILITY): Payer: Medicare Other | Admitting: Adult Health

## 2020-09-02 ENCOUNTER — Encounter: Payer: Self-pay | Admitting: Adult Health

## 2020-09-02 DIAGNOSIS — Z66 Do not resuscitate: Secondary | ICD-10-CM

## 2020-09-02 DIAGNOSIS — K5909 Other constipation: Secondary | ICD-10-CM | POA: Diagnosis not present

## 2020-09-02 DIAGNOSIS — R32 Unspecified urinary incontinence: Secondary | ICD-10-CM

## 2020-09-02 DIAGNOSIS — N1832 Chronic kidney disease, stage 3b: Secondary | ICD-10-CM

## 2020-09-02 NOTE — Progress Notes (Signed)
Location:  [place] Room Number: 145-W Place of Service:  SNF (31)   CODE STATUS: DNR  Allergies  Allergen Reactions  . Cephalexin Other (See Comments)    Pt. Doesn't remember.   . Cephalexin Other (See Comments)    Pt. Doesn't remember.   . Penicillins Other (See Comments)    Pt. Cant remember.     Chief Complaint  Patient presents with  . Medical Management of Chronic Issues        Urinary incontinence:    Chronic constipation:  CKD stage 3b     HPI:  She is a 87 year old long term resident of this facility being seen for the management of her chronic illnesses: Urinary incontinence:    Chronic constipation:  CKD stage 3b.  There are no reports of uncontrolled pain; no reports of changes in appetite; no reports of anxiety. She was treated for lower extremity cellulitis without complications. She continues to be followed by hospice care.   Past Medical History:  Diagnosis Date  . Thyroid disease     Past Surgical History:  Procedure Laterality Date  . ABDOMINAL HYSTERECTOMY    . TONSILLECTOMY    . TOTAL HIP ARTHROPLASTY     left    Social History   Socioeconomic History  . Marital status: Widowed    Spouse name: Not on file  . Number of children: Not on file  . Years of education: Not on file  . Highest education level: Not on file  Occupational History  . Occupation: retired   Tobacco Use  . Smoking status: Never Smoker  . Smokeless tobacco: Never Used  Vaping Use  . Vaping Use: Never used  Substance and Sexual Activity  . Alcohol use: No  . Drug use: No  . Sexual activity: Not Currently  Other Topics Concern  . Not on file  Social History Narrative   Long term resident of [facility] .   Social Determinants of Health   Financial Resource Strain: Not on file  Food Insecurity: Not on file  Transportation Needs: Not on file  Physical Activity: Not on file  Stress: Not on file  Social Connections: Not on file  Intimate Partner  Violence: Not on file   Family History  Problem Relation Age of Onset  . Heart attack Mother   . Heart attack Father   . Hypertension Father       VITAL SIGNS BP 118/60   Pulse 60   Temp 97.6 F (36.4 C)   Resp 20   Ht 5' 1"  (1.549 m)   Wt 123 lb 3.2 oz (55.9 kg)   SpO2 90%   BMI 23.28 kg/m   Outpatient Encounter Medications as of 09/02/2020  Medication Sig  . acetaminophen (TYLENOL) 650 MG CR tablet Take 650 mg by mouth every 6 (six) hours.  Roseanne Kaufman [employer] [facility]) OINT Special Instructions: Apply to sacrum and bilateral buttocks qshift for prevention. Every Shift Day, Evening, Night  . busPIRone (BUSPAR) 5 MG tablet Take 5 mg by mouth daily in the afternoon.  Sarajane Marek Sodium (STOOL SOFTENER/STIMULANT LAXAT PO) Take 1 tablet by mouth daily in the afternoon.  . feeding supplement, ENSURE ENLIVE, (ENSURE ENLIVE) LIQD Take 237 mLs by mouth daily in the afternoon.  . hydrocortisone cream 1 % Apply 1 application topically daily.  Marland Kitchen levothyroxine (SYNTHROID) 100 MCG tablet Take 100 mcg by mouth daily before breakfast.  . loratadine (CLARITIN) 10 MG tablet Take 10  mg by mouth daily. Chronic Allergies  . NON FORMULARY Regular diet  . oxyCODONE (OXY IR/ROXICODONE) 5 MG immediate release tablet Take 1 tablet (5 mg total) by mouth every 8 (eight) hours.  . Vitamins A & D (VITAMIN A & D) ointment Apply 1 application topically in the morning, at noon, and at bedtime. Apply to left and right heel for prevention each shift   No facility-administered encounter medications on file as of 09/02/2020.     SIGNIFICANT DIAGNOSTIC EXAMS  NO NEW EXAMS.    LABS REVIEWED PREVIOUS  10-31-19: wbc 7.6; hg 11.8; hct 38.5; mcv 104.6 plt 311; glucose 156; bun 68; creat 1.31; k+ 4.0; na++ 139; ca 9.2 07-16-20: wbc 4.3; hgb 11.4; hct 36.9; mcv 105.7 plt 234; glucose 84; bun 63; creat 1.20; k+ 4.6; na++ 140; ca 9.3 GFR 41; ast 76; alt 113; alk phos 598; albumin 3.1 tsh  1.874  NO NEW LABS.    Review of Systems  Unable to perform ROS: Dementia (unable to participate )   Physical Exam Constitutional:      General: She is not in acute distress.    Appearance: She is well-developed. She is not diaphoretic.  Neck:     Thyroid: No thyromegaly.  Cardiovascular:     Rate and Rhythm: Normal rate and regular rhythm.     Pulses: Normal pulses.     Heart sounds: Normal heart sounds.  Pulmonary:     Effort: Pulmonary effort is normal. No respiratory distress.     Breath sounds: Normal breath sounds.  Abdominal:     General: Bowel sounds are normal. There is no distension.     Palpations: Abdomen is soft.     Tenderness: There is no abdominal tenderness.  Musculoskeletal:        General: Normal range of motion.     Cervical back: Neck supple.     Right lower leg: No edema.     Left lower leg: No edema.  Lymphadenopathy:     Cervical: No cervical adenopathy.  Skin:    General: Skin is warm and dry.  Neurological:     Mental Status: She is alert. Mental status is at baseline.  Psychiatric:        Mood and Affect: Mood normal.      ASSESSMENT/ PLAN:  TODAY  1. Urinary incontinence: is mostly unaware; will continue to have nursing provide care as indicated  2. Chronic constipation: will continue colace twice daily   3. CKD stage 3b is stable bun 68; creat 1.37; will monitor    PREVIOUS   4. Advanced dementia: is without change weight is stable at 123 pounds  Will continue buspar 5 mg daily for her anxiety  will monitor   5. Chronic generalized pain; is stable will continue oxycodone 5 mg every 8 hours  6. Elevated liver enzymes: ast 76 alt 113; alk phos 598 will monitor as indicated    7. Hypokalemia: k+ 4.6 will monitor  8. Failure to thrive in adult: her weight is 123 pounds and stable albumin 3.1  will continue supplements as directed unable to tolerate remeron will monitor  9. Acquired hypothyroidism: is stable tsh 1.874 will  continue synthroid 100 mcg daily     Ok Edwards NP [facility]  Contact [phone] Monday through Friday 8am- 5pm  After hours call [phone]

## 2020-09-21 ENCOUNTER — Encounter: Payer: Self-pay | Admitting: Nurse Practitioner

## 2020-09-21 ENCOUNTER — Non-Acute Institutional Stay (SKILLED_NURSING_FACILITY): Payer: Medicare HMO | Admitting: Nurse Practitioner

## 2020-09-21 DIAGNOSIS — L309 Dermatitis, unspecified: Secondary | ICD-10-CM | POA: Diagnosis not present

## 2020-09-21 MED ORDER — TRIAMCINOLONE ACETONIDE 0.1 % EX CREA: 1.0000 "application " | TOPICAL_CREAM | Freq: Two times a day (BID) | CUTANEOUS | 0 refills | Status: DC

## 2020-09-21 NOTE — Progress Notes (Signed)
Location:  [facility] Room Number: 145-W Place of Service:  SNF [phone]) Provider:  Janene Harvey. Janyth Contes, NP     [name], NP as PCP - General (Geriatric Medicine) Center, [facility] (Skilled Nursing Facility)  Extended Emergency Contact Information Primary Emergency Contact: [name],[name] Address: [address]          [place], [place] [address] [place] Home Phone: [phone] Mobile Phone: [phone] Relation: Son Secondary Emergency Contact: [name],[name] Address: [address]          [place], [place] [address] [place] of [place] Home Phone: [phone] Mobile Phone: [phone] Relation: Other  Code Status: DNR  Goals of care: Advanced Directive information Advanced Directives 09/21/2020  Does Patient Have a Medical Advance Directive? Yes  Type of Estate agent of [place];Living will;Out of facility DNR (pink MOST or yellow form)  Does patient want to make changes to medical advance directive? No - Patient declined  Copy of Healthcare Power of Attorney in Chart? Yes - validated most recent copy scanned in chart (See row information)  Would patient like information on creating a medical advance directive? -  Pre-existing out of facility DNR order (yellow form or pink MOST form) Yellow form placed in chart (order not valid for inpatient use)     Chief Complaint  Patient presents with   Acute Visit    Itching and redness to bilateral thighs     HPI:  Pt is a 87 y.o. female seen today for an acute visit for itching between legs to bilateral thighs. Nursing noted increase in itching to bilateral inner thighs over the last 3-4 days. Raised red rash noted bilaterally. No drainage or heat noted. No fever. Pt denies pains.    Past Medical History:  Diagnosis Date   Thyroid disease    Past Surgical History:  Procedure Laterality Date   ABDOMINAL HYSTERECTOMY     TONSILLECTOMY      TOTAL HIP ARTHROPLASTY     left    Allergies  Allergen Reactions   Cephalexin Other (See Comments)    Pt. Doesn't remember.    Cephalexin Other (See Comments)    Pt. Doesn't remember.    Penicillins Other (See Comments)    Pt. Cant remember.     Outpatient Encounter Medications as of 09/21/2020  Medication Sig   acetaminophen (TYLENOL) 650 MG CR tablet Take 650 mg by mouth every 6 (six) hours.   Balsam [place]-Castor Oil [facility]) OINT Special Instructions: Apply to sacrum and bilateral buttocks qshift for prevention. Every Shift Day, Evening, Night   busPIRone (BUSPAR) 5 MG tablet Take 5 mg by mouth daily in the afternoon.   Casanthranol-Docusate Sodium (STOOL SOFTENER/STIMULANT LAXAT PO) Take 1 tablet by mouth daily in the afternoon.   feeding supplement, ENSURE ENLIVE, (ENSURE ENLIVE) LIQD Take 237 mLs by mouth daily in the afternoon.   hydrocortisone cream 1 % Apply 1 application topically daily as needed.   levothyroxine (SYNTHROID) 100 MCG tablet Take 100 mcg by mouth daily before breakfast.   loratadine (CLARITIN) 10 MG tablet Take 10 mg by mouth daily. Chronic Allergies   NON FORMULARY Regular diet   oxyCODONE (OXY IR/ROXICODONE) 5 MG immediate release tablet Take 1 tablet (5 mg total) by mouth every 8 (eight) hours.   Vitamins A & D (VITAMIN A & D) ointment Apply 1 application topically in the morning, at noon, and at bedtime. Apply to left and right heel for prevention  each shift   No facility-administered encounter medications on file as of 09/21/2020.    Review of Systems  Skin:  Positive for rash (itching).   Immunization History  Administered Date(s) Administered   Influenza-Unspecified 02/09/2018, 01/17/2020   Moderna SARS-COV2 Booster Vaccination 07/22/2020   Moderna Sars-Covid-2 Vaccination 04/25/2019, 05/24/2019, 02/13/2020   Pertinent  Health Maintenance Due  Topic Date Due   PNA vac Low Risk Adult (1 of 2 - PCV13) Never done   INFLUENZA VACCINE   11/09/2020   DEXA SCAN  Discontinued   Fall Risk  11/29/2019  Falls in the past year? 1  Number falls in past yr: 0  Injury with Fall? 0  Risk for fall due to : Impaired balance/gait;Impaired mobility   Functional Status Survey:    Vitals:   09/21/20 0950  BP: 130/78  Pulse: 68  Resp: 20  Temp: 97.6 F (36.4 C)  SpO2: 90%  Weight: 121 lb 3.2 oz (55 kg)  Height: 5\' 1"  (1.549 m)   Body mass index is 22.9 kg/m. Physical Exam Constitutional:      Appearance: Normal appearance.  Skin:    General: Skin is warm and dry.     Findings: Rash (small circular raised patchy red rash noted to bilateral inner thigh) present.  Neurological:     Mental Status: She is alert.    Labs reviewed: Recent Labs    10/31/19 1015 07/16/20 0607  NA 139 140  K 4.0 4.6  CL 99 104  CO2 25 25  GLUCOSE 156* 84  BUN 68* 63*  CREATININE 1.31* 1.20*  CALCIUM 9.2 9.3   Recent Labs    07/16/20 0607  AST 76*  ALT 113*  ALKPHOS 598*  BILITOT 0.5  PROT 6.4*  ALBUMIN 3.1*   Recent Labs    10/31/19 1015 07/16/20 0607  WBC 7.6 4.3  HGB 11.8* 11.4*  HCT 38.5 36.9  MCV 104.6* 105.7*  PLT 311 234   Lab Results  Component Value Date   TSH 1.874 07/16/2020   No results found for: HGBA1C No results found for: CHOL, HDL, LDLCALC, LDLDIRECT, TRIG, CHOLHDL  Significant Diagnostic Results in last 30 days:  No results found.  Assessment/Plan  1. Dermatitis - triamcinolone cream (KENALOG) 0.1 %; Apply 1 application topically 2 (two) times daily.  Dispense: 30 g; Refill: 0 for 1 week or until resolves. Staff will monitor and notify if area worsens or fails to improve    Kimberly Velazquez K. 09/15/2020  [facility] Adult [facility] [phone]

## 2020-09-25 ENCOUNTER — Encounter: Payer: Self-pay | Admitting: Nurse Practitioner

## 2020-09-25 ENCOUNTER — Non-Acute Institutional Stay (SKILLED_NURSING_FACILITY): Payer: Medicare Other | Admitting: Nurse Practitioner

## 2020-09-25 DIAGNOSIS — K5903 Drug induced constipation: Secondary | ICD-10-CM | POA: Diagnosis not present

## 2020-09-25 DIAGNOSIS — K641 Second degree hemorrhoids: Secondary | ICD-10-CM | POA: Diagnosis not present

## 2020-09-25 NOTE — Progress Notes (Signed)
Location:  [facility] Room Number: 145-W Place of Service:  SNF [phone]) Provider:  Abbey Chatters, NP  Sharee Holster, NP  [name], NP as PCP - General (Geriatric Medicine) Center, [facility] (Skilled Nursing Facility)  Extended Emergency Contact Information Primary Emergency Contact: Sibley,[name] Address: [address]          [place], [place] [address] [place] of [place] Home Phone: [phone] Mobile Phone: [phone] Relation: Son Secondary Emergency Contact: Emley,[name] Address: [address]          [place], [place] [address] [place] Home Phone: [phone] Mobile Phone: [phone] Relation: Other  Code Status:  DNR Goals of care: Advanced Directive information Advanced Directives 09/25/2020  Does Patient Have a Medical Advance Directive? Yes  Type of Estate agent of [place];Living will;Out of facility DNR (pink MOST or yellow form)  Does patient want to make changes to medical advance directive? No - Patient declined  Copy of Healthcare Power of Attorney in Chart? Yes - validated most recent copy scanned in chart (See row information)  Would patient like information on creating a medical advance directive? -  Pre-existing out of facility DNR order (yellow form or pink MOST form) Yellow form placed in chart (order not valid for inpatient use)     Chief Complaint  Patient presents with   Acute Visit    Patient presents today for hemorrhoids.    HPI:  Pt is a 87 y.o. female seen today for an acute visit for bleeding hemorrhoids.  Pt reports she has had a long hx of hemorrhoids with hx of bleeding.  Reports today when she went to have a BM it was hard and she started bleeding. Staff requesting cream.    Past Medical History:  Diagnosis Date   Thyroid disease    Past Surgical History:  Procedure Laterality Date   ABDOMINAL HYSTERECTOMY     TONSILLECTOMY      TOTAL HIP ARTHROPLASTY     left    Allergies  Allergen Reactions   Cephalexin Other (See Comments)    Pt. Doesn't remember.    Cephalexin Other (See Comments)    Pt. Doesn't remember.    Penicillins Other (See Comments)    Pt. Cant remember.     Outpatient Encounter Medications as of 09/25/2020  Medication Sig   acetaminophen (TYLENOL) 650 MG CR tablet Take 650 mg by mouth every 6 (six) hours.   Balsam [place]-Castor Oil [facility]) OINT Special Instructions: Apply to sacrum and bilateral buttocks qshift for prevention. Every Shift Day, Evening, Night   busPIRone (BUSPAR) 5 MG tablet Take 5 mg by mouth daily in the afternoon.   Casanthranol-Docusate Sodium (STOOL SOFTENER/STIMULANT LAXAT PO) Take 1 tablet by mouth daily in the afternoon.   feeding supplement, ENSURE ENLIVE, (ENSURE ENLIVE) LIQD Take 237 mLs by mouth daily in the afternoon.   hydrocortisone cream 1 % Apply 1 application topically daily as needed.   levothyroxine (SYNTHROID) 100 MCG tablet Take 100 mcg by mouth daily before breakfast.   loratadine (CLARITIN) 10 MG tablet Take 10 mg by mouth daily. Chronic Allergies   NON FORMULARY Regular diet   oxyCODONE (OXY IR/ROXICODONE) 5 MG immediate release tablet Take 1 tablet (5 mg total) by mouth every 8 (eight) hours.   triamcinolone cream (KENALOG) 0.1 % Apply 1 application topically 2 (two) times daily.   Vitamins A & D (VITAMIN A & D) ointment Apply 1 application topically  in the morning, at noon, and at bedtime. Apply to left and right heel for prevention each shift   No facility-administered encounter medications on file as of 09/25/2020.    Review of Systems  Gastrointestinal:  Positive for constipation.   Immunization History  Administered Date(s) Administered   Influenza-Unspecified 02/09/2018, 01/17/2020   Moderna SARS-COV2 Booster Vaccination 07/22/2020   Moderna Sars-Covid-2 Vaccination 04/25/2019, 05/24/2019, 02/13/2020   Pertinent  Health Maintenance Due   Topic Date Due   PNA vac Low Risk Adult (1 of 2 - PCV13) Never done   INFLUENZA VACCINE  11/09/2020   DEXA SCAN  Discontinued   Fall Risk  11/29/2019  Falls in the past year? 1  Number falls in past yr: 0  Injury with Fall? 0  Risk for fall due to : Impaired balance/gait;Impaired mobility   Functional Status Survey:    Vitals:   09/25/20 1018  BP: 130/78  Pulse: 68  Resp: 20  Temp: 97.6 F (36.4 C)  SpO2: 90%  Weight: 121 lb 3.2 oz (55 kg)  Height: 5\' 1"  (1.549 m)   Body mass index is 22.9 kg/m. Physical Exam Constitutional:      Appearance: Normal appearance.  Genitourinary:    Rectum: External hemorrhoid (multiple hemorrhoids) present.  Skin:    General: Skin is warm and dry.  Neurological:     Mental Status: She is alert. Mental status is at baseline.  Psychiatric:        Mood and Affect: Mood normal.    Labs reviewed: Recent Labs    10/31/19 1015 07/16/20 0607  NA 139 140  K 4.0 4.6  CL 99 104  CO2 25 25  GLUCOSE 156* 84  BUN 68* 63*  CREATININE 1.31* 1.20*  CALCIUM 9.2 9.3   Recent Labs    07/16/20 0607  AST 76*  ALT 113*  ALKPHOS 598*  BILITOT 0.5  PROT 6.4*  ALBUMIN 3.1*   Recent Labs    10/31/19 1015 07/16/20 0607  WBC 7.6 4.3  HGB 11.8* 11.4*  HCT 38.5 36.9  MCV 104.6* 105.7*  PLT 311 234   Lab Results  Component Value Date   TSH 1.874 07/16/2020   No results found for: HGBA1C No results found for: CHOL, HDL, LDLCALC, LDLDIRECT, TRIG, CHOLHDL  Significant Diagnostic Results in last 30 days:  No results found.  Assessment/Plan 1. Grade II hemorrhoids -to start preparation H QID, scheduled for 48 hours then PRN. To avoid straining and sitting on the toilet for extended periods of time.   2. Drug-induced constipation She is on chronic pain medication, will increase stool softener to twice daily at this time to help with straining.    09/15/2020. Janene Harvey  [facility] Adult [facility] [phone]

## 2020-09-25 NOTE — Progress Notes (Signed)
err

## 2020-09-28 ENCOUNTER — Other Ambulatory Visit: Payer: Self-pay | Admitting: Adult Health

## 2020-09-28 MED ORDER — OXYCODONE HCL 5 MG PO TABS: 5.0000 mg | ORAL_TABLET | Freq: Three times a day (TID) | ORAL | 0 refills | Status: DC

## 2020-10-07 DIAGNOSIS — Z1159 Encounter for screening for other viral diseases: Secondary | ICD-10-CM | POA: Diagnosis not present

## 2020-10-07 DIAGNOSIS — D649 Anemia, unspecified: Secondary | ICD-10-CM | POA: Diagnosis not present

## 2020-10-13 DIAGNOSIS — D649 Anemia, unspecified: Secondary | ICD-10-CM | POA: Diagnosis not present

## 2020-10-13 DIAGNOSIS — Z1159 Encounter for screening for other viral diseases: Secondary | ICD-10-CM | POA: Diagnosis not present

## 2020-10-15 DIAGNOSIS — Z1159 Encounter for screening for other viral diseases: Secondary | ICD-10-CM | POA: Diagnosis not present

## 2020-10-15 DIAGNOSIS — D649 Anemia, unspecified: Secondary | ICD-10-CM | POA: Diagnosis not present

## 2020-10-20 DIAGNOSIS — D649 Anemia, unspecified: Secondary | ICD-10-CM | POA: Diagnosis not present

## 2020-10-20 DIAGNOSIS — Z1159 Encounter for screening for other viral diseases: Secondary | ICD-10-CM | POA: Diagnosis not present

## 2020-10-21 ENCOUNTER — Other Ambulatory Visit: Payer: Self-pay | Admitting: Adult Health

## 2020-10-21 ENCOUNTER — Encounter: Payer: Self-pay | Admitting: Adult Health

## 2020-10-21 ENCOUNTER — Non-Acute Institutional Stay (SKILLED_NURSING_FACILITY): Payer: Medicare Other | Admitting: Adult Health

## 2020-10-21 DIAGNOSIS — G8929 Other chronic pain: Secondary | ICD-10-CM | POA: Diagnosis not present

## 2020-10-21 DIAGNOSIS — R627 Adult failure to thrive: Secondary | ICD-10-CM

## 2020-10-21 DIAGNOSIS — F03C0 Unspecified dementia, severe, without behavioral disturbance, psychotic disturbance, mood disturbance, and anxiety: Secondary | ICD-10-CM

## 2020-10-21 DIAGNOSIS — R69 Illness, unspecified: Secondary | ICD-10-CM | POA: Diagnosis not present

## 2020-10-21 DIAGNOSIS — R52 Pain, unspecified: Secondary | ICD-10-CM

## 2020-10-21 DIAGNOSIS — F0390 Unspecified dementia without behavioral disturbance: Secondary | ICD-10-CM

## 2020-10-21 MED ORDER — OXYCODONE HCL 5 MG PO TABS: 5.0000 mg | ORAL_TABLET | Freq: Three times a day (TID) | ORAL | 0 refills | Status: DC

## 2020-10-21 NOTE — Progress Notes (Signed)
Location:  [place] Room Number: 145-W Place of Service:  SNF (31)   CODE STATUS: DNR  Allergies  Allergen Reactions  . Cephalexin Other (See Comments)    Pt. Doesn't remember.   . Cephalexin Other (See Comments)    Pt. Doesn't remember.   . Penicillins Other (See Comments)    Pt. Cant remember.     Chief Complaint  Patient presents with  . Medical Management of Chronic Issues          Advanced dementia:    Chronic generalized pain:  Elevated liver enzymes:    HPI:  She is a 87 year old long term resident of this facility being seen for the management of her chronic illnesses: Advanced dementia:    Chronic generalized pain:  Elevated liver enzymes:. There are no reports of uncontrolled pain. Her mood state has improved with the use of buspar low dose daily. There are no reports of insomnia. She continues to be followed by hospice care.   Past Medical History:  Diagnosis Date  . Thyroid disease     Past Surgical History:  Procedure Laterality Date  . ABDOMINAL HYSTERECTOMY    . TONSILLECTOMY    . TOTAL HIP ARTHROPLASTY     left    Social History   Socioeconomic History  . Marital status: Widowed    Spouse name: Not on file  . Number of children: Not on file  . Years of education: Not on file  . Highest education level: Not on file  Occupational History  . Occupation: retired   Tobacco Use  . Smoking status: Never  . Smokeless tobacco: Never  Vaping Use  . Vaping Use: Never used  Substance and Sexual Activity  . Alcohol use: No  . Drug use: No  . Sexual activity: Not Currently  Other Topics Concern  . Not on file  Social History Narrative   Long term resident of [facility] .   Social Determinants of Health   Financial Resource Strain: Not on file  Food Insecurity: Not on file  Transportation Needs: Not on file  Physical Activity: Not on file  Stress: Not on file  Social Connections: Not on file  Intimate Partner Violence: Not on  file   Family History  Problem Relation Age of Onset  . Heart attack Mother   . Heart attack Father   . Hypertension Father       VITAL SIGNS BP (!) 127/58   Pulse 74   Temp 97.9 F (36.6 C)   Ht 5' 1"  (1.549 m)   Wt 120 lb (54.4 kg)   SpO2 90%   BMI 22.67 kg/m   Outpatient Encounter Medications as of 10/21/2020  Medication Sig  . acetaminophen (TYLENOL) 650 MG CR tablet Take 650 mg by mouth every 6 (six) hours.  Roseanne Kaufman [place]-Castor Oil [facility]) OINT Special Instructions: Apply to sacrum and bilateral buttocks qshift for prevention. Every Shift Day, Evening, Night  . busPIRone (BUSPAR) 5 MG tablet Take 5 mg by mouth daily in the afternoon.  Sarajane Marek Sodium (STOOL SOFTENER/STIMULANT LAXAT PO) Take 1 tablet by mouth daily in the afternoon.  . feeding supplement, ENSURE ENLIVE, (ENSURE ENLIVE) LIQD Take 237 mLs by mouth daily in the afternoon.  . hydrocortisone cream 1 % Apply 1 application topically daily as needed.  Marland Kitchen levothyroxine (SYNTHROID) 100 MCG tablet Take 100 mcg by mouth daily before breakfast.  . loratadine (CLARITIN) 10 MG tablet Take 10 mg by mouth  daily. Chronic Allergies  . NON FORMULARY Regular diet  . oxyCODONE (OXY IR/ROXICODONE) 5 MG immediate release tablet Take 1 tablet (5 mg total) by mouth every 8 (eight) hours.  . Pramox-PE-Glycerin-Petrolatum (HEMORRHOIDAL EX) Apply topically. 0.25-1 %; rectal Special Instructions: Apply rectally daily PRN hemorrhoids  . Vitamins A & D (VITAMIN A & D) ointment Apply 1 application topically in the morning, at noon, and at bedtime. Apply to left and right heel for prevention each shift  . [DISCONTINUED] triamcinolone cream (KENALOG) 0.1 % Apply 1 application topically 2 (two) times daily.   No facility-administered encounter medications on file as of 10/21/2020.     SIGNIFICANT DIAGNOSTIC EXAMS  NO NEW EXAMS.    LABS REVIEWED PREVIOUS  10-31-19: wbc 7.6; hg 11.8; hct 38.5; mcv 104.6 plt 311;  glucose 156; bun 68; creat 1.31; k+ 4.0; na++ 139; ca 9.2 07-16-20: wbc 4.3; hgb 11.4; hct 36.9; mcv 105.7 plt 234; glucose 84; bun 63; creat 1.20; k+ 4.6; na++ 140; ca 9.3 GFR 41; ast 76; alt 113; alk phos 598; albumin 3.1 tsh 1.874  NO NEW LABS.    Review of Systems  Unable to perform ROS: Dementia (unable to participate)   Physical Exam Constitutional:      General: She is not in acute distress.    Appearance: She is well-developed. She is not diaphoretic.  Neck:     Thyroid: No thyromegaly.  Cardiovascular:     Rate and Rhythm: Normal rate and regular rhythm.     Pulses: Normal pulses.     Heart sounds: Normal heart sounds.  Pulmonary:     Effort: Pulmonary effort is normal. No respiratory distress.     Breath sounds: Normal breath sounds.  Abdominal:     General: Bowel sounds are normal. There is no distension.     Palpations: Abdomen is soft.     Tenderness: There is no abdominal tenderness.  Musculoskeletal:        General: Normal range of motion.     Cervical back: Neck supple.     Right lower leg: No edema.     Left lower leg: No edema.  Lymphadenopathy:     Cervical: No cervical adenopathy.  Skin:    General: Skin is warm and dry.  Neurological:     Mental Status: She is alert. Mental status is at baseline.  Psychiatric:        Mood and Affect: Mood normal.     ASSESSMENT/ PLAN:  TODAY  Advanced dementia: is without significant change has had a 3 pound weight loss with her weight at 120 pounds. Will continue buspar 5 mg daily to help with mood state  2. Chronic generalized pain: is stable will continue oxycodone 5 mg every 8 hours  3. Elevated liver enzymes: ast 76; alt 113; alk phos 598 will monitor    PREVIOUS    4. Hypokalemia: k+ 4.6 will monitor  87 Failure to thrive in adult: her weight is 120 pounds and stable albumin 3.1  will continue supplements as directed unable to tolerate remeron will monitor  5. Acquired hypothyroidism: is stable tsh  1.874 will continue synthroid 100 mcg daily  6. Urinary incontinence: is mostly unaware; will continue to have nursing provide care as indicated  7. Chronic constipation: will continue colace twice daily   8. CKD stage 3b is stable bun 68; creat 1.37; will monitor       Ok Edwards NP [facility] Adult Medicine  Contact [phone] Monday through Friday  8am- 5pm  After hours call [phone]

## 2020-10-22 ENCOUNTER — Non-Acute Institutional Stay (SKILLED_NURSING_FACILITY): Payer: Medicare HMO | Admitting: Adult Health

## 2020-10-22 ENCOUNTER — Encounter: Payer: Self-pay | Admitting: Adult Health

## 2020-10-22 DIAGNOSIS — F0390 Unspecified dementia without behavioral disturbance: Secondary | ICD-10-CM

## 2020-10-22 DIAGNOSIS — F03C0 Unspecified dementia, severe, without behavioral disturbance, psychotic disturbance, mood disturbance, and anxiety: Secondary | ICD-10-CM

## 2020-10-22 DIAGNOSIS — M170 Bilateral primary osteoarthritis of knee: Secondary | ICD-10-CM | POA: Diagnosis not present

## 2020-10-22 DIAGNOSIS — Z1159 Encounter for screening for other viral diseases: Secondary | ICD-10-CM | POA: Diagnosis not present

## 2020-10-22 DIAGNOSIS — N1832 Chronic kidney disease, stage 3b: Secondary | ICD-10-CM

## 2020-10-22 DIAGNOSIS — E039 Hypothyroidism, unspecified: Secondary | ICD-10-CM | POA: Diagnosis not present

## 2020-10-22 DIAGNOSIS — D649 Anemia, unspecified: Secondary | ICD-10-CM | POA: Diagnosis not present

## 2020-10-22 DIAGNOSIS — R69 Illness, unspecified: Secondary | ICD-10-CM | POA: Diagnosis not present

## 2020-10-22 NOTE — Progress Notes (Signed)
Location:  [place] Room Number: 145 Place of Service:  SNF (31)   CODE STATUS: dnr   Allergies  Allergen Reactions   Cephalexin Other (See Comments)    Pt. Doesn't remember.    Cephalexin Other (See Comments)    Pt. Doesn't remember.    Penicillins Other (See Comments)    Pt. Cant remember.     Chief Complaint  Patient presents with   Acute Visit    Care plan meeting     HPI:  We have come together for her care plan meeting. Family present . BIMS 5/15 mood 0/30.  There have been no falls. She is nonambulatory she requires extensive to dependent for her adls. She is frequently incontinent of bladder and bowel. She has had some bleeding hemorrhoids. Dietary: weighs 120 pounds has a good appetite    Therapy none at this time . She continues to be followed by hospice care. She continues to be followed for chronic illnesses including: Advanced dementia Stage 3b chronic kidney disease Acquired hypothyroidism Primary osteoarthritis bilateral knees.  Past Medical History:  Diagnosis Date   Thyroid disease     Past Surgical History:  Procedure Laterality Date   ABDOMINAL HYSTERECTOMY     TONSILLECTOMY     TOTAL HIP ARTHROPLASTY     left    Social History   Socioeconomic History   Marital status: Widowed    Spouse name: Not on file   Number of children: Not on file   Years of education: Not on file   Highest education level: Not on file  Occupational History   Occupation: retired   Tobacco Use   Smoking status: Never   Smokeless tobacco: Never  Vaping Use   Vaping Use: Never used  Substance and Sexual Activity   Alcohol use: No   Drug use: No   Sexual activity: Not Currently  Other Topics Concern   Not on file  Social History Narrative   Long term resident of [facility] .   Social Determinants of Health   Financial Resource Strain: Not on file  Food Insecurity: Not on file  Transportation Needs: Not on file  Physical Activity: Not on  file  Stress: Not on file  Social Connections: Not on file  Intimate Partner Violence: Not on file   Family History  Problem Relation Age of Onset   Heart attack Mother    Heart attack Father    Hypertension Father       VITAL SIGNS BP (!) 127/58   Pulse 74   Temp 97.9 F (36.6 C)   Resp 18   Ht _0  (1.549 m)   Wt 120 lb (54.4 kg)   BMI 22.67 kg/m   Outpatient Encounter Medications as of 10/22/2020  Medication Sig   acetaminophen (TYLENOL) 650 MG CR tablet Take 650 mg by mouth every 6 (six) hours.   Balsam [place]-Castor Oil [facility]) OINT Special Instructions: Apply to sacrum and bilateral buttocks qshift for prevention. Every Shift Day, Evening, Night   busPIRone (BUSPAR) 5 MG tablet Take 5 mg by mouth daily in the afternoon.   Casanthranol-Docusate Sodium (STOOL SOFTENER/STIMULANT LAXAT PO) Take 1 tablet by mouth daily in the afternoon.   feeding supplement, ENSURE ENLIVE, (ENSURE ENLIVE) LIQD Take 237 mLs by mouth daily in the afternoon.   hydrocortisone cream 1 % Apply 1 application topically daily as needed.   levothyroxine (SYNTHROID) 100 MCG tablet Take 100 mcg by mouth daily before breakfast.  loratadine (CLARITIN) 10 MG tablet Take 10 mg by mouth daily. Chronic Allergies   NON FORMULARY Regular diet   oxyCODONE (OXY IR/ROXICODONE) 5 MG immediate release tablet Take 1 tablet (5 mg total) by mouth every 8 (eight) hours.   Pramox-PE-Glycerin-Petrolatum (HEMORRHOIDAL EX) Apply topically. 0.25-1 %; rectal Special Instructions: Apply rectally daily PRN hemorrhoids   Vitamins A & D (VITAMIN A & D) ointment Apply 1 application topically in the morning, at noon, and at bedtime. Apply to left and right heel for prevention each shift   No facility-administered encounter medications on file as of 10/22/2020.     SIGNIFICANT DIAGNOSTIC EXAMS  NO NEW EXAMS.    LABS REVIEWED PREVIOUS  10-31-19: wbc 7.6; hg 11.8; hct 38.5; mcv 104.6 plt 311; glucose 156; bun 68; creat  1.31; k+ 4.0; na++ 139; ca 9.2 07-16-20: wbc 4.3; hgb 11.4; hct 36.9; mcv 105.7 plt 234; glucose 84; bun 63; creat 1.20; k+ 4.6; na++ 140; ca 9.3 GFR 41; ast 76; alt 113; alk phos 598; albumin 3.1 tsh 1.874  NO NEW LABS.   Review of Systems  Unable to perform ROS: Dementia (unable to participate)    Physical Exam Constitutional:      General: She is not in acute distress.    Appearance: She is well-developed. She is not diaphoretic.  Neck:     Thyroid: No thyromegaly.  Cardiovascular:     Rate and Rhythm: Normal rate and regular rhythm.     Pulses: Normal pulses.     Heart sounds: Normal heart sounds.  Pulmonary:     Effort: Pulmonary effort is normal. No respiratory distress.     Breath sounds: Normal breath sounds.  Abdominal:     General: Bowel sounds are normal. There is no distension.     Palpations: Abdomen is soft.     Tenderness: There is no abdominal tenderness.  Musculoskeletal:        General: Normal range of motion.     Cervical back: Neck supple.     Right lower leg: No edema.     Left lower leg: No edema.  Lymphadenopathy:     Cervical: No cervical adenopathy.  Skin:    General: Skin is warm and dry.  Neurological:     Mental Status: She is alert. Mental status is at baseline.  Psychiatric:        Mood and Affect: Mood normal.       ASSESSMENT/ PLAN:  TODAY  Advanced dementia Stage 3b chronic kidney disease Acquired hypothyroidism Primary osteoarthritis bilateral knees.   Will continue current plan of care Will continue current medications Will continue to monitor her status She continues to be followed by hospice care   Time spent with patient: 40 minutes: medications; goals of care; dietary needs.    Ok Edwards NP [facility] Adult Medicine  Contact [phone] Monday through Friday 8am- 5pm  After hours call [phone]

## 2020-10-27 DIAGNOSIS — D649 Anemia, unspecified: Secondary | ICD-10-CM | POA: Diagnosis not present

## 2020-10-27 DIAGNOSIS — Z1159 Encounter for screening for other viral diseases: Secondary | ICD-10-CM | POA: Diagnosis not present

## 2020-10-29 DIAGNOSIS — D649 Anemia, unspecified: Secondary | ICD-10-CM | POA: Diagnosis not present

## 2020-10-29 DIAGNOSIS — Z1159 Encounter for screening for other viral diseases: Secondary | ICD-10-CM | POA: Diagnosis not present

## 2020-11-03 DIAGNOSIS — Z1159 Encounter for screening for other viral diseases: Secondary | ICD-10-CM | POA: Diagnosis not present

## 2020-11-03 DIAGNOSIS — D649 Anemia, unspecified: Secondary | ICD-10-CM | POA: Diagnosis not present

## 2020-11-05 DIAGNOSIS — D649 Anemia, unspecified: Secondary | ICD-10-CM | POA: Diagnosis not present

## 2020-11-05 DIAGNOSIS — Z1159 Encounter for screening for other viral diseases: Secondary | ICD-10-CM | POA: Diagnosis not present

## 2020-11-10 DIAGNOSIS — Z1159 Encounter for screening for other viral diseases: Secondary | ICD-10-CM | POA: Diagnosis not present

## 2020-11-10 DIAGNOSIS — D649 Anemia, unspecified: Secondary | ICD-10-CM | POA: Diagnosis not present

## 2020-11-16 ENCOUNTER — Other Ambulatory Visit: Payer: Self-pay | Admitting: Adult Health

## 2020-11-16 MED ORDER — OXYCODONE HCL 5 MG PO TABS: 5.0000 mg | ORAL_TABLET | Freq: Three times a day (TID) | ORAL | 0 refills | Status: DC

## 2020-11-25 ENCOUNTER — Encounter: Payer: Self-pay | Admitting: Internal Medicine

## 2020-11-25 ENCOUNTER — Non-Acute Institutional Stay (SKILLED_NURSING_FACILITY): Payer: Medicare Other | Admitting: Internal Medicine

## 2020-11-25 DIAGNOSIS — F0390 Unspecified dementia without behavioral disturbance: Secondary | ICD-10-CM

## 2020-11-25 DIAGNOSIS — E039 Hypothyroidism, unspecified: Secondary | ICD-10-CM

## 2020-11-25 DIAGNOSIS — R69 Illness, unspecified: Secondary | ICD-10-CM | POA: Diagnosis not present

## 2020-11-25 DIAGNOSIS — D539 Nutritional anemia, unspecified: Secondary | ICD-10-CM | POA: Diagnosis not present

## 2020-11-25 DIAGNOSIS — R748 Abnormal levels of other serum enzymes: Secondary | ICD-10-CM

## 2020-11-25 DIAGNOSIS — F03C0 Unspecified dementia, severe, without behavioral disturbance, psychotic disturbance, mood disturbance, and anxiety: Secondary | ICD-10-CM

## 2020-11-25 DIAGNOSIS — N1832 Chronic kidney disease, stage 3b: Secondary | ICD-10-CM | POA: Diagnosis not present

## 2020-11-25 NOTE — Assessment & Plan Note (Signed)
Current TSH therapeutic 

## 2020-11-25 NOTE — Assessment & Plan Note (Addendum)
She is pleasantly demented.  She gave the year as 1979.  She had "no idea" as to the name of the president.  She kept expressing gratitude for the "kindness of the family that has taken me in".

## 2020-11-25 NOTE — Assessment & Plan Note (Signed)
Current creatinine 1.20 / GFR 41  ; CKD Stage 3b Medication List reviewed. No nephrotoxic agents identified.

## 2020-11-25 NOTE — Assessment & Plan Note (Addendum)
Most current alk phos 598, AST 76 ALT 113 on 07/16/20 Total bilirubin WNL  No RUQ tenderness on abd exam, no jaundice or scleral icterus Only imaging was abd/KUB film 3/25/21which was negative

## 2020-11-25 NOTE — Assessment & Plan Note (Addendum)
Current H/H 11.4/36.9 Indices macrocytic, MCV 105.7 Anemia minimally worse  07/04/19  B12 level supranormal @ 1014 ROS negative for bleeding dyscrasias & none reported by Staff

## 2020-11-25 NOTE — Progress Notes (Signed)
   NURSING HOME LOCATION:  [facility] ROOM NUMBER:  [id number]  CODE STATUS:  DNR  PCP:  Synthia Innocent NP  This is a nursing facility follow up visit of chronic medical diagnoses & to document compliance with Regulation 483.30 (c) in The Long Term Care Survey Manual Phase 2 which mandates caregiver visit ( visits can alternate among physician, PA or NP as per statutes) within 10 days of 30 days / 60 days/ 90 days post admission to SNF date    Interim medical record and care since last SNF visit was updated with review of diagnostic studies and change in clinical status since last visit were documented.  HPI: She is a permanent resident of this facility with diagnoses of hypothyroidism, advanced dementia,DJD,OAB,CKD, elevated LFTs, & adult failure to thrive. Surgeries and procedures include THA and abdominal hysterectomy. It is no surprise that she has never smoked or drunk alcohol as she is 89.  Review of systems: Dementia invalidated responses. Date given as 28; POTUS could not be named. Her major complaint was pain in her heels with redness which she relates to "slipping on glass" when she arrived here "1 year ago".  Initially she told me she was doing "pretty good" but subsequently said "not well".  She went on to say "I am fine and should not of told you that as good as this sweet family treats me".  She then asked me how I was doing and added "[place] good as the country[place].  Physical exam:  Pertinent or positive findings: She appears younger than her stated age.  She sits in the wheelchair with torticollis causing the head to be tilted to the right.  Eyebrows are absent.  The lacrimal glands are prominent.  Dental hygiene is very good.  Heart sounds are distant and irregular.  Chest is surprisingly clear.  Pedal pulses are decreased.  She has trace edema at the sock line.  Interosseous wasting is noted.  She has flexion contractures of the left fourth and fifth  fingers.  General appearance: Adequately nourished; no acute distress, increased work of breathing is present.   Lymphatic: No lymphadenopathy about the head, neck, axilla. Eyes: No conjunctival inflammation or lid edema is present. There is no scleral icterus. Ears:  External ear exam shows no significant lesions or deformities.   Nose:  External nasal examination shows no deformity or inflammation. Nasal mucosa are pink and moist without lesions, exudates Oral exam:  Lips and gums are healthy appearing. There is no oropharyngeal erythema or exudate. Neck:  No thyromegaly, masses, tenderness noted.    Heart:  No gallop, murmur, click, rub .  Lungs: without wheezes, rhonchi, rales, rubs. Abdomen: Bowel sounds are normal. Abdomen is soft and nontender with no organomegaly, hernias, masses. GU: Deferred  Extremities:  No cyanosis, clubbing Neurologic exam :Balance, Rhomberg, finger to nose testing could not be completed due to clinical state Skin: Warm & dry w/o tenting. No significant lesions or rash.  See summary under each active problem in the Problem List with associated updated therapeutic plan

## 2020-11-26 NOTE — Patient Instructions (Signed)
See assessment and plan under each diagnosis in the problem list and acutely for this visit 

## 2020-12-01 ENCOUNTER — Encounter: Payer: Self-pay | Admitting: Internal Medicine

## 2020-12-01 NOTE — Progress Notes (Signed)
This encounter was created in error - please disregard.

## 2020-12-01 NOTE — Progress Notes (Signed)
   NURSING HOME LOCATION:  [facility] ROOM NUMBER:  145 W  CODE STATUS:    PCP:    This is a nursing facility follow up visit for specific acute issue of  of chronic medical diagnoses  Nursing Facility readmission within 30 days  to document compliance with Regulation 483.30 (c) in The Long Term Care Survey Manual Phase 2 which mandates caregiver visit ( visits can alternate among physician, PA or NP as per statutes) within 10 days of 30 days / 60 days/ 90 days post admission to SNF date    Interim medical record and care since last SNF visit was updated with review of diagnostic studies and change in clinical status since last visit were documented.  HPI:  Review of systems: Dementia invalidated responses. Date given as   Constitutional: No fever, significant weight change, fatigue  Eyes: No redness, discharge, pain, vision change ENT/mouth: No nasal congestion,  purulent discharge, earache, change in hearing, sore throat  Cardiovascular: No chest pain, palpitations, paroxysmal nocturnal dyspnea, claudication, edema  Respiratory: No cough, sputum production, hemoptysis, DOE, significant snoring, apnea   Gastrointestinal: No heartburn, dysphagia, abdominal pain, nausea /vomiting, rectal bleeding, melena, change in bowels Genitourinary: No dysuria, hematuria, pyuria, incontinence, nocturia Musculoskeletal: No joint stiffness, joint swelling, weakness, pain Dermatologic: No rash, pruritus, change in appearance of skin Neurologic: No dizziness, headache, syncope, seizures, numbness, tingling Psychiatric: No significant anxiety, depression, insomnia, anorexia Endocrine: No change in hair/skin/nails, excessive thirst, excessive hunger, excessive urination  Hematologic/lymphatic: No significant bruising, lymphadenopathy, abnormal bleeding Allergy/immunology: No itchy/watery eyes, significant sneezing, urticaria, angioedema  Physical exam:  Pertinent or positive  findings: General appearance: Adequately nourished; no acute distress, increased work of breathing is present.   Lymphatic: No lymphadenopathy about the head, neck, axilla. Eyes: No conjunctival inflammation or lid edema is present. There is no scleral icterus. Ears:  External ear exam shows no significant lesions or deformities.   Nose:  External nasal examination shows no deformity or inflammation. Nasal mucosa are pink and moist without lesions, exudates Oral exam:  Lips and gums are healthy appearing. There is no oropharyngeal erythema or exudate. Neck:  No thyromegaly, masses, tenderness noted.    Heart:  Normal rate and regular rhythm. S1 and S2 normal without gallop, murmur, click, rub .  Lungs: Chest clear to auscultation without wheezes, rhonchi, rales, rubs. Abdomen: Bowel sounds are normal. Abdomen is soft and nontender with no organomegaly, hernias, masses. GU: Deferred  Extremities:  No cyanosis, clubbing, edema  Neurologic exam : Cn 2-7 intact Strength equal  in upper & lower extremities Balance, Rhomberg, finger to nose testing could not be completed due to clinical state Deep tendon reflexes are equal Skin: Warm & dry w/o tenting. No significant lesions or rash.  See summary under each active problem in the Problem List with associated updated therapeutic plan

## 2020-12-10 ENCOUNTER — Other Ambulatory Visit: Payer: Self-pay | Admitting: Adult Health

## 2020-12-10 MED ORDER — OXYCODONE HCL 5 MG PO TABS: 5.0000 mg | ORAL_TABLET | Freq: Three times a day (TID) | ORAL | 0 refills | Status: DC

## 2020-12-22 ENCOUNTER — Encounter: Payer: Self-pay | Admitting: Adult Health

## 2020-12-22 ENCOUNTER — Non-Acute Institutional Stay (SKILLED_NURSING_FACILITY): Payer: Medicare Other | Admitting: Adult Health

## 2020-12-22 DIAGNOSIS — E039 Hypothyroidism, unspecified: Secondary | ICD-10-CM | POA: Diagnosis not present

## 2020-12-22 DIAGNOSIS — E876 Hypokalemia: Secondary | ICD-10-CM | POA: Diagnosis not present

## 2020-12-22 DIAGNOSIS — R627 Adult failure to thrive: Secondary | ICD-10-CM

## 2020-12-22 NOTE — Progress Notes (Signed)
Location:  [facility] Room Number: 145-W Place of Service:  SNF (31)   CODE STATUS: DNR  Allergies  Allergen Reactions   Cephalexin Other (See Comments)    Pt. Doesn't remember.    Cephalexin Other (See Comments)    Pt. Doesn't remember.    Penicillins Other (See Comments)    Pt. Cant remember.     Chief Complaint  Patient presents with   Medical Management of Chronic Issues         Hypokalemia:  Failure to thrive in adult:   Acquired hypothyroidism:     HPI:  She is a 87 year old long term resident of this facility being seen for the management of her chronic illnesses;  Hypokalemia:  Failure to thrive in adult:   Acquired hypothyroidism:. There are no reports of uncontrolled pain. No reports of agitation; no reports of changes in appetite. Her weight is stable.   Past Medical History:  Diagnosis Date   Thyroid disease     Past Surgical History:  Procedure Laterality Date   ABDOMINAL HYSTERECTOMY     TONSILLECTOMY     TOTAL HIP ARTHROPLASTY     left    Social History   Socioeconomic History   Marital status: Widowed    Spouse name: Not on file   Number of children: Not on file   Years of education: Not on file   Highest education level: Not on file  Occupational History   Occupation: retired   Tobacco Use   Smoking status: Never   Smokeless tobacco: Never  Vaping Use   Vaping Use: Never used  Substance and Sexual Activity   Alcohol use: No   Drug use: No   Sexual activity: Not Currently  Other Topics Concern   Not on file  Social History Narrative   Long term resident of [facility] .   Social Determinants of Health   Financial Resource Strain: Not on file  Food Insecurity: Not on file  Transportation Needs: Not on file  Physical Activity: Not on file  Stress: Not on file  Social Connections: Not on file  Intimate Partner Violence: Not on file   Family History  Problem Relation Age of Onset   Heart attack Mother    Heart  attack Father    Hypertension Father       VITAL SIGNS BP 124/68   Pulse 89   Temp 97.7 F (36.5 C)   Resp 16   Ht 5' 1"  (1.549 m)   Wt 120 lb (54.4 kg)   SpO2 90%   BMI 22.67 kg/m   Outpatient Encounter Medications as of 12/22/2020  Medication Sig   acetaminophen (TYLENOL) 650 MG CR tablet Take 650 mg by mouth every 6 (six) hours.   Balsam [place]-Castor Oil [facility]) OINT Special Instructions: Apply to sacrum and bilateral buttocks qshift for prevention. Every Shift Day, Evening, Night   busPIRone (BUSPAR) 5 MG tablet Take 5 mg by mouth daily in the afternoon.   Casanthranol-Docusate Sodium (STOOL SOFTENER/STIMULANT LAXAT PO) Take 1 tablet by mouth daily in the afternoon.   feeding supplement, ENSURE ENLIVE, (ENSURE ENLIVE) LIQD Take 237 mLs by mouth daily in the afternoon.   hydrocortisone cream 1 % Apply 1 application topically daily as needed.   levothyroxine (SYNTHROID) 100 MCG tablet Take 100 mcg by mouth daily before breakfast.   loratadine (CLARITIN) 10 MG tablet Take 10 mg by mouth daily. Chronic Allergies   NON FORMULARY Regular diet  oxyCODONE (OXY IR/ROXICODONE) 5 MG immediate release tablet Take 1 tablet (5 mg total) by mouth every 8 (eight) hours.   Pramox-PE-Glycerin-Petrolatum (HEMORRHOIDAL EX) Apply topically. 0.25-1 %; rectal Special Instructions: Apply rectally daily PRN hemorrhoids   Vitamins A & D (VITAMIN A & D) ointment Apply 1 application topically in the morning, at noon, and at bedtime. Apply to left and right heel for prevention each shift   witch hazel-glycerin (TUCKS) pad Apply 1 application topically 3 (three) times daily as needed for itching. Special Instructions: Apply Tucks pad to hemorrhoids   No facility-administered encounter medications on file as of 12/22/2020.     SIGNIFICANT DIAGNOSTIC EXAMS  NO NEW EXAMS.    LABS REVIEWED PREVIOUS  10-31-19: wbc 7.6; hg 11.8; hct 38.5; mcv 104.6 plt 311; glucose 156; bun 68; creat 1.31; k+ 4.0;  na++ 139; ca 9.2 07-16-20: wbc 4.3; hgb 11.4; hct 36.9; mcv 105.7 plt 234; glucose 84; bun 63; creat 1.20; k+ 4.6; na++ 140; ca 9.3 GFR 41; ast 76; alt 113; alk phos 598; albumin 3.1 tsh 1.874  NO NEW LABS.   Review of Systems  Unable to perform ROS: Dementia (unable to participate)   Physical Exam Constitutional:      General: She is not in acute distress.    Appearance: She is well-developed. She is not diaphoretic.  Neck:     Thyroid: No thyromegaly.  Cardiovascular:     Rate and Rhythm: Normal rate and regular rhythm.     Pulses: Normal pulses.     Heart sounds: Normal heart sounds.  Pulmonary:     Effort: Pulmonary effort is normal. No respiratory distress.     Breath sounds: Normal breath sounds.  Abdominal:     General: Bowel sounds are normal. There is no distension.     Palpations: Abdomen is soft.     Tenderness: There is no abdominal tenderness.  Musculoskeletal:        General: Normal range of motion.     Cervical back: Neck supple.     Right lower leg: No edema.     Left lower leg: No edema.  Lymphadenopathy:     Cervical: No cervical adenopathy.  Skin:    General: Skin is warm and dry.  Neurological:     Mental Status: She is alert. Mental status is at baseline.  Psychiatric:        Mood and Affect: Mood normal.    ASSESSMENT/ PLAN:  TODAY  Hypokalemia: k+ 4.6 will monitor  2. Failure to thrive in adult: weight is 120; last albumin 3.1; will continue supplements as directed; was unable to tolerate remeron  3. Acquired hypothyroidism: is stable tsh 1.874 will continue synthroid 100 mcg daily   PREVIOUS   4. Urinary incontinence: is mostly unaware; will continue to have nursing provide care as indicated  5. Chronic constipation: will continue colace twice daily   6. CKD stage 3b is stable bun 68; creat 1.37; will monitor   7. Advanced dementia: is without significant change weight at 120 pounds. Will continue buspar 5 mg daily to help with mood  state  8. Chronic generalized pain: is stable will continue oxycodone 5 mg every 8 hours  9. Elevated liver enzymes: ast 76; alt 113; alk phos 598 will monitor       Ok Edwards NP [facility]  Contact [phone] Monday through Friday 8am- 5pm  After hours call [phone]

## 2021-01-11 ENCOUNTER — Other Ambulatory Visit: Payer: Self-pay | Admitting: Adult Health

## 2021-01-11 MED ORDER — OXYCODONE HCL 5 MG PO TABS: 5.0000 mg | ORAL_TABLET | Freq: Three times a day (TID) | ORAL | 0 refills | Status: DC

## 2021-01-19 ENCOUNTER — Non-Acute Institutional Stay (INDEPENDENT_AMBULATORY_CARE_PROVIDER_SITE_OTHER): Payer: Medicare Other | Admitting: Adult Health

## 2021-01-19 DIAGNOSIS — Z0000 Encounter for general adult medical examination without abnormal findings: Secondary | ICD-10-CM | POA: Diagnosis not present

## 2021-01-19 NOTE — Patient Instructions (Signed)
  Kimberly Velazquez , Thank you for taking time to come for your Medicare Wellness Visit. I appreciate your ongoing commitment to your health goals. Please review the following plan we discussed and let me know if I can assist you in the future.   These are the goals we discussed:  Goals      DIET - INCREASE WATER INTAKE     Follow up with Provider as scheduled     General - Client will not be readmitted within 30 days ([facility])        This is a list of the screening recommended for you and due dates:  Health Maintenance  Topic Date Due   Flu Shot  11/09/2020   COVID-19 Vaccine  Completed   HPV Vaccine  Aged Out   DEXA scan (bone density measurement)  Discontinued   Tetanus Vaccine  Discontinued   Zoster (Shingles) Vaccine  Discontinued

## 2021-01-19 NOTE — Progress Notes (Signed)
Subjective:   Kimberly Velazquez is a 87 y.o. female who presents for Medicare Annual (Subsequent) preventive examination.  Review of Systems    Review of Systems  Unable to perform ROS: Dementia (unable to participate)   Cardiac Risk Factors include: advanced age (>51men, >53 women);sedentary lifestyle     Objective:    Today's Vitals   01/19/21 1059  BP: 130/70  Pulse: 70  Temp: (!) 97.4 F (36.3 C)  Weight: 121 lb 12.8 oz (55.2 kg)  Height: 5\' 1"  (1.549 m)   Body mass index is 23.01 kg/m.  Advanced Directives 12/22/2020 10/21/2020 09/25/2020 09/21/2020 09/02/2020 08/07/2020 08/05/2020  Does Patient Have a Medical Advance Directive? Yes Yes Yes Yes Yes Yes Yes  Type of 08/07/2020 of [place];Living will;Out of facility DNR (pink MOST or yellow form) Healthcare Power of [place];Living will;Out of facility DNR (pink MOST or yellow form) Healthcare Power of [place];Living will;Out of facility DNR (pink MOST or yellow form) Healthcare Power of [place];Living will;Out of facility DNR (pink MOST or yellow form) Healthcare Power of [place];Living will;Out of facility DNR (pink MOST or yellow form) Healthcare Power of [place];Living will;Out of facility DNR (pink MOST or yellow form) Healthcare Power of [place];Living will;Out of facility DNR (pink MOST or yellow form)  Does patient want to make changes to medical advance directive? No - Patient declined No - Patient declined No - Patient declined No - Patient declined No - Patient declined No - Patient declined No - Patient declined  Copy of Healthcare Power of Attorney in Chart? Yes - validated most recent copy scanned in chart (See row information) Yes - validated most recent copy scanned in chart (See row information) Yes - validated most recent copy scanned in chart (See row information) Yes - validated most recent copy scanned in chart (See row information) Yes - validated most recent copy scanned in chart (See row  information) Yes - validated most recent copy scanned in chart (See row information) Yes - validated most recent copy scanned in chart (See row information)  Would patient like information on creating a medical advance directive? - - - - - - -  Pre-existing out of facility DNR order (yellow form or pink MOST form) Yellow form placed in chart (order not valid for inpatient use) Pink MOST/Yellow Form most recent copy in chart - Physician notified to receive inpatient order Yellow form placed in chart (order not valid for inpatient use) Yellow form placed in chart (order not valid for inpatient use) Yellow form placed in chart (order not valid for inpatient use) - -    Current Medications (verified) Outpatient Encounter Medications as of 01/19/2021  Medication Sig   acetaminophen (TYLENOL) 650 MG CR tablet Take 650 mg by mouth every 6 (six) hours.   Balsam [place]-Castor Oil [facility]) OINT Special Instructions: Apply to sacrum and bilateral buttocks qshift for prevention. Every Shift Day, Evening, Night   busPIRone (BUSPAR) 5 MG tablet Take 5 mg by mouth daily in the afternoon.   Casanthranol-Docusate Sodium (STOOL SOFTENER/STIMULANT LAXAT PO) Take 1 tablet by mouth daily in the afternoon.   feeding supplement, ENSURE ENLIVE, (ENSURE ENLIVE) LIQD Take 237 mLs by mouth daily in the afternoon.   hydrocortisone cream 1 % Apply 1 application topically daily as needed.   levothyroxine (SYNTHROID) 100 MCG tablet Take 100 mcg by mouth daily before breakfast.   loratadine (CLARITIN) 10 MG tablet Take 10 mg by mouth daily. Chronic Allergies   NON FORMULARY Regular  diet   oxyCODONE (OXY IR/ROXICODONE) 5 MG immediate release tablet Take 1 tablet (5 mg total) by mouth every 8 (eight) hours.   Pramox-PE-Glycerin-Petrolatum (HEMORRHOIDAL EX) Apply topically. 0.25-1 %; rectal Special Instructions: Apply rectally daily PRN hemorrhoids   Vitamins A & D (VITAMIN A & D) ointment Apply 1 application topically in the  morning, at noon, and at bedtime. Apply to left and right heel for prevention each shift   witch hazel-glycerin (TUCKS) pad Apply 1 application topically 3 (three) times daily as needed for itching. Special Instructions: Apply Tucks pad to hemorrhoids   No facility-administered encounter medications on file as of 01/19/2021.    Allergies (verified) Cephalexin, Cephalexin, and Penicillins   History: Past Medical History:  Diagnosis Date   Thyroid disease    Past Surgical History:  Procedure Laterality Date   ABDOMINAL HYSTERECTOMY     TONSILLECTOMY     TOTAL HIP ARTHROPLASTY     left   Family History  Problem Relation Age of Onset   Heart attack Mother    Heart attack Father    Hypertension Father    Social History   Socioeconomic History   Marital status: Widowed    Spouse name: Not on file   Number of children: Not on file   Years of education: Not on file   Highest education level: Not on file  Occupational History   Occupation: retired   Tobacco Use   Smoking status: Never   Smokeless tobacco: Never  Vaping Use   Vaping Use: Never used  Substance and Sexual Activity   Alcohol use: No   Drug use: No   Sexual activity: Not Currently  Other Topics Concern   Not on file  Social History Narrative   Long term resident of [facility] .   Social Determinants of Health   Financial Resource Strain: Not on file  Food Insecurity: Not on file  Transportation Needs: Not on file  Physical Activity: Not on file  Stress: Not on file  Social Connections: Not on file    Tobacco Counseling Counseling given: Not Answered   Clinical Intake:  Pre-visit preparation completed: Yes  Pain : No/denies pain     BMI - recorded: 23.01 Nutritional Status: BMI of 19-24  Normal Nutritional Risks: Unintentional weight loss, Failure to thrive Diabetes: No  How often do you need to have someone help you when you read instructions, pamphlets, or other written materials from your  doctor or pharmacy?: 5 - Always  Diabetic?no  Interpreter Needed?: No      Activities of Daily Living In your present state of health, do you have any difficulty performing the following activities: 01/19/2021  Hearing? N  Vision? N  Difficulty concentrating or making decisions? Y  Walking or climbing stairs? Y  Dressing or bathing? Y  Doing errands, shopping? Y  Preparing Food and eating ? Y  Using the Toilet? Y  In the past six months, have you accidently leaked urine? Y  Do you have problems with loss of bowel control? Y  Managing your Medications? Y  Managing your Finances? Y  Housekeeping or managing your Housekeeping? Y  Some recent data might be hidden    [name], NP as PCP - General (Geriatric Medicine) [facility]  Indicate any recent Medical Services you may have received from other than [facility] providers in the past year (date may be approximate).     Assessment:   This is  a routine wellness examination for [facility]  Hearing/Vision screen No results found.  Dietary issues and exercise activities discussed: Current Exercise Habits: The patient does not participate in regular exercise at present, Exercise limited by: neurologic condition(s)   Goals Addressed             This Visit's Progress    DIET - INCREASE WATER INTAKE   On track    Follow up with Provider as scheduled   On track    General - Client will not be readmitted within 30 days ([facility])   On track      Depression Screen PHQ 2/9 Scores 01/19/2021 11/29/2019  PHQ - 2 Score - 0  Exception Documentation Other- indicate reason in comment box -  Not completed unable to participate -    Fall Risk Fall Risk  01/19/2021 11/29/2019  Falls in the past year? 0 1  Number falls in past yr: 0 0  Injury with Fall? 0 0  Risk for fall due to : - Impaired balance/gait;Impaired mobility    FALL RISK PREVENTION PERTAINING TO THE HOME:  Any  stairs in or around the home? No  If so, are there any without handrails? Yes  Home free of loose throw rugs in walkways, pet beds, electrical cords, etc? Yes  Adequate lighting in your home to reduce risk of falls? Yes   ASSISTIVE DEVICES UTILIZED TO PREVENT FALLS:  Life alert? No  Use of a cane, walker or w/c? Yes  Grab bars in the bathroom? Yes  Shower chair or bench in shower? Yes  Elevated toilet seat or a handicapped toilet? Yes   TIMED UP AND GO:  Was the test performed? No .  Length of time to ambulate 10 feet:  nonambulatory   Gait unsteady with use of assistive device, provider informed and education provided.   Cognitive Function: MMSE - Mini Mental State Exam 01/19/2021  Not completed: Unable to complete        Immunizations Immunization History  Administered Date(s) Administered   Influenza-Unspecified 02/09/2018, 01/17/2020   Moderna SARS-COV2 Booster Vaccination 07/22/2020   Moderna Sars-Covid-2 Vaccination 04/25/2019, 05/24/2019, 02/13/2020    TDAP status: Up to date  Flu Vaccine status: Up to date  Pneumococcal vaccine status: Up to date  Covid-19 vaccine status: Completed vaccines  Qualifies for Shingles Vaccine? Yes   Zostavax completed Yes   Shingrix Completed?: Yes  Screening Tests Health Maintenance  Topic Date Due   INFLUENZA VACCINE  11/09/2020   COVID-19 Vaccine  Completed   HPV VACCINES  Aged Out   DEXA SCAN  Discontinued   TETANUS/TDAP  Discontinued   Zoster Vaccines- Shingrix  Discontinued    Health Maintenance  Health Maintenance Due  Topic Date Due   INFLUENZA VACCINE  11/09/2020    Colorectal cancer screening: No longer required.   Mammogram status: No longer required due to age.  Bone density none due to age   Lung Cancer Screening: (Low Dose CT Chest recommended if Age 57-80 years, 30 pack-year currently smoking OR have quit w/in 15years.) does not qualify.   Lung Cancer Screening Referral: n/a   Additional  Screening:  Hepatitis C Screening: does not qualify; Completed   Vision Screening: Recommended annual ophthalmology exams for early detection of glaucoma and other disorders of the eye. Is the patient up to date with their annual eye exam?  Yes  Who is the provider or what is the name of the office in which the patient attends annual eye  exams?  If pt is not established with a provider, would they like to be referred to a provider to establish care? Yes .   Dental Screening: Recommended annual dental exams for proper oral hygiene  Community Resource Referral / Chronic Care Management: CRR required this visit?  No   CCM required this visit?  No      Plan:     I have personally reviewed and noted the following in the patient's chart:   Medical and social history Use of alcohol, tobacco or illicit drugs  Current medications and supplements including opioid prescriptions.  Functional ability and status Nutritional status Physical activity Advanced directives List of other physicians Hospitalizations, surgeries, and ER visits in previous 12 months Vitals Screenings to include cognitive, depression, and falls Referrals and appointments  In addition, I have reviewed and discussed with patient certain preventive protocols, quality metrics, and best practice recommendations. A written personalized care plan for preventive services as well as general preventive health recommendations were provided to patient.    Sharee Holster, NP   01/19/2021   Nurse Notes:

## 2021-01-21 ENCOUNTER — Encounter: Payer: Self-pay | Admitting: Adult Health

## 2021-01-21 ENCOUNTER — Non-Acute Institutional Stay (SKILLED_NURSING_FACILITY): Payer: Medicare Other | Admitting: Adult Health

## 2021-01-21 DIAGNOSIS — K5909 Other constipation: Secondary | ICD-10-CM | POA: Diagnosis not present

## 2021-01-21 DIAGNOSIS — N1832 Chronic kidney disease, stage 3b: Secondary | ICD-10-CM | POA: Diagnosis not present

## 2021-01-21 DIAGNOSIS — N3281 Overactive bladder: Secondary | ICD-10-CM | POA: Diagnosis not present

## 2021-01-22 ENCOUNTER — Encounter: Payer: Self-pay | Admitting: Adult Health

## 2021-01-22 ENCOUNTER — Non-Acute Institutional Stay (SKILLED_NURSING_FACILITY): Payer: Medicare Other | Admitting: Adult Health

## 2021-01-22 DIAGNOSIS — R69 Illness, unspecified: Secondary | ICD-10-CM | POA: Diagnosis not present

## 2021-01-22 DIAGNOSIS — N1832 Chronic kidney disease, stage 3b: Secondary | ICD-10-CM | POA: Diagnosis not present

## 2021-01-22 DIAGNOSIS — F01C0 Vascular dementia, severe, without behavioral disturbance, psychotic disturbance, mood disturbance, and anxiety: Secondary | ICD-10-CM

## 2021-01-22 DIAGNOSIS — R627 Adult failure to thrive: Secondary | ICD-10-CM

## 2021-01-22 NOTE — Progress Notes (Signed)
Location:  [place] Room Number: 145-W Place of Service:  SNF (31)   CODE STATUS: DNR  Allergies  Allergen Reactions   Cephalexin Other (See Comments)    Pt. Doesn't remember.    Cephalexin Other (See Comments)    Pt. Doesn't remember.    Penicillins Other (See Comments)    Pt. Cant remember.     Chief Complaint  Patient presents with   Acute Visit    Care plan meeting    HPI:  We have come together for her care plan meeting. Family present; is followed by hospice care.   BIMS 4/15 mood 0/30. Family has noted increased in hallucinations; and delusional thoughts. She does not get agitated.   She requires extensive assist with adls; is nonambulatory.she is incontinent of bladder and bowel. There have been no falls. Dietary: weight stable at 121.8 pounds; regular diet has a variable appetite feeds self after setup takes supplements. Therapy none at this time. She continues to be followed for her chronic illnesses including:  Adult failure to thrive in adult Severe vascular dementia without behavioral disturbance psychotic disturbance mood or anxiety Chronic kidney disease stage 3 b   Past Medical History:  Diagnosis Date   Thyroid disease     Past Surgical History:  Procedure Laterality Date   ABDOMINAL HYSTERECTOMY     TONSILLECTOMY     TOTAL HIP ARTHROPLASTY     left    Social History   Socioeconomic History   Marital status: Widowed    Spouse name: Not on file   Number of children: Not on file   Years of education: Not on file   Highest education level: Not on file  Occupational History   Occupation: retired   Tobacco Use   Smoking status: Never   Smokeless tobacco: Never  Vaping Use   Vaping Use: Never used  Substance and Sexual Activity   Alcohol use: No   Drug use: No   Sexual activity: Not Currently  Other Topics Concern   Not on file  Social History Narrative   Long term resident of [facility] .   Social Determinants of Health    Financial Resource Strain: Not on file  Food Insecurity: Not on file  Transportation Needs: Not on file  Physical Activity: Not on file  Stress: Not on file  Social Connections: Not on file  Intimate Partner Violence: Not on file   Family History  Problem Relation Age of Onset   Heart attack Mother    Heart attack Father    Hypertension Father       VITAL SIGNS BP 130/70   Pulse 70   Temp (!) 97.4 F (36.3 C)   Resp 18   Ht 5' 1"  (1.549 m)   Wt 121 lb 12.8 oz (55.2 kg)   SpO2 90%   BMI 23.01 kg/m   Outpatient Encounter Medications as of 01/22/2021  Medication Sig   acetaminophen (TYLENOL) 650 MG CR tablet Take 650 mg by mouth every 8 (eight) hours as needed.   Balsam [place]-Castor Oil [facility]) OINT Special Instructions: Apply to sacrum and bilateral buttocks qshift for prevention. Every Shift Day, Evening, Night   busPIRone (BUSPAR) 5 MG tablet Take 5 mg by mouth daily in the afternoon.   Casanthranol-Docusate Sodium (STOOL SOFTENER/STIMULANT LAXAT PO) Take 1 tablet by mouth in the morning and at bedtime. 42m/8.6mg   feeding supplement, ENSURE ENLIVE, (ENSURE ENLIVE) LIQD Take 237 mLs by mouth daily in the afternoon.  hydrocortisone cream 1 % Apply 1 application topically daily as needed.   levothyroxine (SYNTHROID) 100 MCG tablet Take 100 mcg by mouth daily.   loratadine (CLARITIN) 10 MG tablet Take 10 mg by mouth daily. Chronic Allergies   mirabegron ER (MYRBETRIQ) 25 MG TB24 tablet Take 25 mg by mouth daily.   NON FORMULARY Regular diet   oxyCODONE (OXY IR/ROXICODONE) 5 MG immediate release tablet Take 1 tablet (5 mg total) by mouth every 8 (eight) hours.   Pramox-PE-Glycerin-Petrolatum (HEMORRHOIDAL EX) Apply topically. 0.25-1 %; rectal Special Instructions: Apply rectally daily PRN hemorrhoids   Vitamins A & D (VITAMIN A & D) ointment Apply 1 application topically in the morning, at noon, and at bedtime. Apply to left and right heel for prevention each shift    witch hazel-glycerin (TUCKS) pad Apply 1 application topically 3 (three) times daily as needed for itching. Special Instructions: Apply Tucks pad to hemorrhoids   No facility-administered encounter medications on file as of 01/22/2021.     SIGNIFICANT DIAGNOSTIC EXAMS   NO NEW EXAMS.    LABS REVIEWED PREVIOUS  10-31-19: wbc 7.6; hg 11.8; hct 38.5; mcv 104.6 plt 311; glucose 156; bun 68; creat 1.31; k+ 4.0; na++ 139; ca 9.2 07-16-20: wbc 4.3; hgb 11.4; hct 36.9; mcv 105.7 plt 234; glucose 84; bun 63; creat 1.20; k+ 4.6; na++ 140; ca 9.3 GFR 41; ast 76; alt 113; alk phos 598; albumin 3.1 tsh 1.874  NO NEW LABS  Review of Systems  Unable to perform ROS: Dementia (unable to participate)   Physical Exam Constitutional:      General: She is not in acute distress.    Appearance: She is well-developed. She is not diaphoretic.  Neck:     Thyroid: No thyromegaly.  Cardiovascular:     Rate and Rhythm: Normal rate and regular rhythm.     Pulses: Normal pulses.     Heart sounds: Normal heart sounds.  Pulmonary:     Effort: Pulmonary effort is normal. No respiratory distress.     Breath sounds: Normal breath sounds.  Abdominal:     General: Bowel sounds are normal. There is no distension.     Palpations: Abdomen is soft.     Tenderness: There is no abdominal tenderness.  Musculoskeletal:        General: Normal range of motion.     Cervical back: Neck supple.     Right lower leg: No edema.     Left lower leg: No edema.  Lymphadenopathy:     Cervical: No cervical adenopathy.  Skin:    General: Skin is warm and dry.  Neurological:     Mental Status: She is alert. Mental status is at baseline.  Psychiatric:        Mood and Affect: Mood normal.      ASSESSMENT/ PLAN:  TODAY  Adult failure to thrive in adult Severe vascular dementia without behavioral disturbance psychotic disturbance mood or anxiety Chronic kidney disease stage 3 b      Will continue current  medications Will continue current plan of care Will continue to monitor her status.   Time spent with patient: 40 minutes: goals of care; medications plan of care.   Ok Edwards NP [facility]  Contact [phone] Monday through Friday 8am- 5pm  After hours call [phone]

## 2021-01-22 NOTE — Progress Notes (Signed)
Location:  [place] Room Number: 145-W Place of Service:  SNF (31)   CODE STATUS: DNR   Allergies  Allergen Reactions   Cephalexin Other (See Comments)    Pt. Doesn't remember.    Cephalexin Other (See Comments)    Pt. Doesn't remember.    Penicillins Other (See Comments)    Pt. Cant remember.     Chief Complaint  Patient presents with   Medical Management of Chronic Issues                 Urinary incontinence:  Chronic constipation:  CKD stage 3b    HPI:  She is a 87 year old long term resident of this facility being seen for the management of her chronic illnesses: Urinary incontinence:  Chronic constipation:  CKD stage 3b. There are no reports of uncontrolled pain. She is crying about being incontinent of urine.   Past Medical History:  Diagnosis Date   Thyroid disease     Past Surgical History:  Procedure Laterality Date   ABDOMINAL HYSTERECTOMY     TONSILLECTOMY     TOTAL HIP ARTHROPLASTY     left    Social History   Socioeconomic History   Marital status: Widowed    Spouse name: Not on file   Number of children: Not on file   Years of education: Not on file   Highest education level: Not on file  Occupational History   Occupation: retired   Tobacco Use   Smoking status: Never   Smokeless tobacco: Never  Vaping Use   Vaping Use: Never used  Substance and Sexual Activity   Alcohol use: No   Drug use: No   Sexual activity: Not Currently  Other Topics Concern   Not on file  Social History Narrative   Long term resident of [facility] .   Social Determinants of Health   Financial Resource Strain: Not on file  Food Insecurity: Not on file  Transportation Needs: Not on file  Physical Activity: Not on file  Stress: Not on file  Social Connections: Not on file  Intimate Partner Violence: Not on file   Family History  Problem Relation Age of Onset   Heart attack Mother    Heart attack Father    Hypertension Father        VITAL SIGNS BP 130/70   Temp (!) 97.4 F (36.3 C)   Resp 18   Ht 5' 1"  (1.549 m)   Wt 121 lb 12.8 oz (55.2 kg)   SpO2 90%   BMI 23.01 kg/m   Outpatient Encounter Medications as of 01/21/2021  Medication Sig   acetaminophen (TYLENOL) 650 MG CR tablet Take 650 mg by mouth every 8 (eight) hours as needed.   Balsam [place]-Castor Oil [facility]) OINT Special Instructions: Apply to sacrum and bilateral buttocks qshift for prevention. Every Shift Day, Evening, Night   busPIRone (BUSPAR) 5 MG tablet Take 5 mg by mouth daily in the afternoon.   Casanthranol-Docusate Sodium (STOOL SOFTENER/STIMULANT LAXAT PO) Take 1 tablet by mouth in the morning and at bedtime. 77m/8.6mg   feeding supplement, ENSURE ENLIVE, (ENSURE ENLIVE) LIQD Take 237 mLs by mouth daily in the afternoon.   hydrocortisone cream 1 % Apply 1 application topically daily as needed.   levothyroxine (SYNTHROID) 100 MCG tablet Take 100 mcg by mouth daily.   loratadine (CLARITIN) 10 MG tablet Take 10 mg by mouth daily. Chronic Allergies   NON FORMULARY Regular diet  oxyCODONE (OXY IR/ROXICODONE) 5 MG immediate release tablet Take 1 tablet (5 mg total) by mouth every 8 (eight) hours.   Pramox-PE-Glycerin-Petrolatum (HEMORRHOIDAL EX) Apply topically. 0.25-1 %; rectal Special Instructions: Apply rectally daily PRN hemorrhoids   Vitamins A & D (VITAMIN A & D) ointment Apply 1 application topically in the morning, at noon, and at bedtime. Apply to left and right heel for prevention each shift   witch hazel-glycerin (TUCKS) pad Apply 1 application topically 3 (three) times daily as needed for itching. Special Instructions: Apply Tucks pad to hemorrhoids   No facility-administered encounter medications on file as of 01/21/2021.     SIGNIFICANT DIAGNOSTIC EXAMS  NO NEW EXAMS.    LABS REVIEWED PREVIOUS  10-31-19: wbc 7.6; hg 11.8; hct 38.5; mcv 104.6 plt 311; glucose 156; bun 68; creat 1.31; k+ 4.0; na++ 139; ca 9.2 07-16-20:  wbc 4.3; hgb 11.4; hct 36.9; mcv 105.7 plt 234; glucose 84; bun 63; creat 1.20; k+ 4.6; na++ 140; ca 9.3 GFR 41; ast 76; alt 113; alk phos 598; albumin 3.1 tsh 1.874  NO NEW LABS.   Review of Systems  Unable to perform ROS: Dementia (unable to participate)   Physical Exam Constitutional:      General: She is not in acute distress.    Appearance: She is well-developed. She is not diaphoretic.  Neck:     Thyroid: No thyromegaly.  Cardiovascular:     Rate and Rhythm: Normal rate and regular rhythm.     Pulses: Normal pulses.     Heart sounds: Normal heart sounds.  Pulmonary:     Effort: Pulmonary effort is normal. No respiratory distress.     Breath sounds: Normal breath sounds.  Abdominal:     General: Bowel sounds are normal. There is no distension.     Palpations: Abdomen is soft.     Tenderness: There is no abdominal tenderness.  Musculoskeletal:        General: Normal range of motion.     Cervical back: Neck supple.     Right lower leg: No edema.     Left lower leg: No edema.  Lymphadenopathy:     Cervical: No cervical adenopathy.  Skin:    General: Skin is warm and dry.  Neurological:     Mental Status: She is alert. Mental status is at baseline.  Psychiatric:        Mood and Affect: Mood normal.    ASSESSMENT/ PLAN:  TODAY  Urinary incontinence: is worse; she is presently very aware of this: will begin myrbetriq 25 mg daily and will monitor her response  2. Chronic constipation: will continue colace twice daily   3. CKD stage 3b is stable bun 68; creat 1.37 will monitor   PREVIOUS   4. Advanced dementia: is without significant change weight at 121 pounds. Will continue buspar 5 mg daily to help with mood state  5. Chronic generalized pain: is stable will continue oxycodone 5 mg every 8 hours  6. Elevated liver enzymes: ast 76; alt 113; alk phos 598 will monitor   7. Hypokalemia: k+ 4.6 will monitor  8. Failure to thrive in adult: weight is 121; last  albumin 3.1; will continue supplements as directed; was unable to tolerate remeron  9. Acquired hypothyroidism: is stable tsh 1.874 will continue synthroid 100 mcg daily          Ok Edwards NP [facility]  Contact [phone] Monday through Friday 8am- 5pm  After hours call [phone]

## 2021-02-03 DIAGNOSIS — D649 Anemia, unspecified: Secondary | ICD-10-CM | POA: Diagnosis not present

## 2021-02-03 DIAGNOSIS — Z1159 Encounter for screening for other viral diseases: Secondary | ICD-10-CM | POA: Diagnosis not present

## 2021-02-09 ENCOUNTER — Other Ambulatory Visit: Payer: Self-pay | Admitting: Adult Health

## 2021-02-09 MED ORDER — OXYCODONE HCL 5 MG PO TABS: 5.0000 mg | ORAL_TABLET | Freq: Three times a day (TID) | ORAL | 0 refills | Status: DC

## 2021-02-18 ENCOUNTER — Non-Acute Institutional Stay (SKILLED_NURSING_FACILITY): Payer: Medicare HMO | Admitting: Adult Health

## 2021-02-18 ENCOUNTER — Encounter: Payer: Self-pay | Admitting: Adult Health

## 2021-02-18 DIAGNOSIS — G8929 Other chronic pain: Secondary | ICD-10-CM

## 2021-02-18 DIAGNOSIS — R52 Pain, unspecified: Secondary | ICD-10-CM

## 2021-02-18 DIAGNOSIS — R69 Illness, unspecified: Secondary | ICD-10-CM | POA: Diagnosis not present

## 2021-02-18 DIAGNOSIS — R748 Abnormal levels of other serum enzymes: Secondary | ICD-10-CM | POA: Diagnosis not present

## 2021-02-18 DIAGNOSIS — F01C0 Vascular dementia, severe, without behavioral disturbance, psychotic disturbance, mood disturbance, and anxiety: Secondary | ICD-10-CM

## 2021-02-18 NOTE — Progress Notes (Signed)
Location:  [place] Room Number: 145-W Place of Service:  SNF (31)   CODE STATUS: DNR  Allergies  Allergen Reactions   Cephalexin Other (See Comments)    Pt. Doesn't remember.    Cephalexin Other (See Comments)    Pt. Doesn't remember.    Penicillins Other (See Comments)    Pt. Cant remember.     Chief Complaint  Patient presents with   Medical Management of Chronic Issues              Advanced dementia:  Chronic generalized pain:  Elevated liver enzymes:    HPI:  She is a 87 year old long term resident of this facility being seen for the management of her chronic illnesses: Advanced dementia:  Chronic generalized pain:  Elevated liver enzymes: there are no reports of uncontrolled pain; no reports of changes in appetite; no reports of agitation or anxiety. She continues to be followed by hospice care.   Past Medical History:  Diagnosis Date   Thyroid disease     Past Surgical History:  Procedure Laterality Date   ABDOMINAL HYSTERECTOMY     TONSILLECTOMY     TOTAL HIP ARTHROPLASTY     left    Social History   Socioeconomic History   Marital status: Widowed    Spouse name: Not on file   Number of children: Not on file   Years of education: Not on file   Highest education level: Not on file  Occupational History   Occupation: retired   Tobacco Use   Smoking status: Never   Smokeless tobacco: Never  Vaping Use   Vaping Use: Never used  Substance and Sexual Activity   Alcohol use: No   Drug use: No   Sexual activity: Not Currently  Other Topics Concern   Not on file  Social History Narrative   Long term resident of [facility] .   Social Determinants of Health   Financial Resource Strain: Not on file  Food Insecurity: Not on file  Transportation Needs: Not on file  Physical Activity: Not on file  Stress: Not on file  Social Connections: Not on file  Intimate Partner Violence: Not on file   Family History  Problem Relation Age of  Onset   Heart attack Mother    Heart attack Father    Hypertension Father       VITAL SIGNS BP (!) 114/56   Pulse 76   Temp (!) 96.9 F (36.1 C)   Resp 18   Ht _0  (1.549 m)   Wt 125 lb 9.6 oz (57 kg)   SpO2 90%   BMI 23.73 kg/m   Outpatient Encounter Medications as of 02/18/2021  Medication Sig   acetaminophen (TYLENOL) 650 MG CR tablet Take 650 mg by mouth every 8 (eight) hours as needed.   Balsam [place]-Castor Oil [facility]) OINT Special Instructions: Apply to sacrum and bilateral buttocks qshift for prevention. Every Shift Day, Evening, Night   busPIRone (BUSPAR) 5 MG tablet Take 5 mg by mouth daily in the afternoon.   Casanthranol-Docusate Sodium (STOOL SOFTENER/STIMULANT LAXAT PO) Take 1 tablet by mouth in the morning and at bedtime. 42m/8.6mg   feeding supplement, ENSURE ENLIVE, (ENSURE ENLIVE) LIQD Take 237 mLs by mouth daily in the afternoon.   hydrocortisone cream 1 % Apply 1 application topically daily as needed.   levothyroxine (SYNTHROID) 100 MCG tablet Take 100 mcg by mouth daily.   loratadine (CLARITIN) 10 MG tablet Take 10  mg by mouth daily. Chronic Allergies   mirabegron ER (MYRBETRIQ) 25 MG TB24 tablet Take 25 mg by mouth daily.   NON FORMULARY Regular diet   oxyCODONE (OXY IR/ROXICODONE) 5 MG immediate release tablet Take 1 tablet (5 mg total) by mouth every 8 (eight) hours.   Pramox-PE-Glycerin-Petrolatum (HEMORRHOIDAL EX) Apply topically. 0.25-1 %; rectal Special Instructions: Apply rectally daily PRN hemorrhoids   Vitamins A & D (VITAMIN A & D) ointment Apply 1 application topically in the morning, at noon, and at bedtime. Apply to left and right heel for prevention each shift   witch hazel-glycerin (TUCKS) pad Apply 1 application topically 3 (three) times daily as needed for itching. Special Instructions: Apply Tucks pad to hemorrhoids   No facility-administered encounter medications on file as of 02/18/2021.     SIGNIFICANT DIAGNOSTIC EXAMS  NO  NEW EXAMS.    LABS REVIEWED PREVIOUS  10-31-19: wbc 7.6; hg 11.8; hct 38.5; mcv 104.6 plt 311; glucose 156; bun 68; creat 1.31; k+ 4.0; na++ 139; ca 9.2 07-16-20: wbc 4.3; hgb 11.4; hct 36.9; mcv 105.7 plt 234; glucose 84; bun 63; creat 1.20; k+ 4.6; na++ 140; ca 9.3 GFR 41; ast 76; alt 113; alk phos 598; albumin 3.1 tsh 1.874  NO NEW LABS.   Review of Systems  Unable to perform ROS: Dementia (unable to participate)   Physical Exam Constitutional:      General: She is not in acute distress.    Appearance: She is well-developed. She is not diaphoretic.  Neck:     Thyroid: No thyromegaly.  Cardiovascular:     Rate and Rhythm: Normal rate and regular rhythm.     Pulses: Normal pulses.     Heart sounds: Normal heart sounds.  Pulmonary:     Effort: Pulmonary effort is normal. No respiratory distress.     Breath sounds: Normal breath sounds.  Abdominal:     General: Bowel sounds are normal. There is no distension.     Palpations: Abdomen is soft.     Tenderness: There is no abdominal tenderness.  Musculoskeletal:        General: Normal range of motion.     Cervical back: Neck supple.     Right lower leg: No edema.     Left lower leg: No edema.  Lymphadenopathy:     Cervical: No cervical adenopathy.  Skin:    General: Skin is warm and dry.  Neurological:     Mental Status: She is alert. Mental status is at baseline.  Psychiatric:        Mood and Affect: Mood normal.    ASSESSMENT/ PLAN:  TODAY  Advanced dementia: is without change: weight is 125 ppounds; will continue buspar 5 mg daily to help control anxiety  2. Chronic generalized pain: is stable will continue oxycodone 5 mg every 8 hours  3. Elevated liver enzymes: ast 76; alt 113; alk phos 598; will monitor    PREVIOUS   5. Hypokalemia: k+ 4.6 will monitor  6. Failure to thrive in adult: weight is 125; last albumin 3.1; will continue supplements as directed; was unable to tolerate remeron  7. Acquired  hypothyroidism: is stable tsh 1.874 will continue synthroid 100 mcg daily   8. Urinary incontinence: is stable will continue  myrbetriq 25 mg daily and will monitor her response  9. Chronic constipation: will continue colace twice daily   10. CKD stage 3b is stable bun 68; creat 1.37 will monitor  Ok Edwards NP [facility]  Contact [phone] Monday through Friday 8am- 5pm  After hours call [phone]

## 2021-03-11 ENCOUNTER — Other Ambulatory Visit: Payer: Self-pay | Admitting: Adult Health

## 2021-03-11 MED ORDER — OXYCODONE HCL 5 MG PO TABS: 5.0000 mg | ORAL_TABLET | Freq: Three times a day (TID) | ORAL | 0 refills | Status: DC

## 2021-03-22 ENCOUNTER — Non-Acute Institutional Stay (SKILLED_NURSING_FACILITY): Payer: Medicare Other | Admitting: Internal Medicine

## 2021-03-22 ENCOUNTER — Encounter: Payer: Self-pay | Admitting: Internal Medicine

## 2021-03-22 DIAGNOSIS — R627 Adult failure to thrive: Secondary | ICD-10-CM | POA: Diagnosis not present

## 2021-03-22 DIAGNOSIS — R252 Cramp and spasm: Secondary | ICD-10-CM | POA: Diagnosis not present

## 2021-03-22 DIAGNOSIS — F01C0 Vascular dementia, severe, without behavioral disturbance, psychotic disturbance, mood disturbance, and anxiety: Secondary | ICD-10-CM | POA: Diagnosis not present

## 2021-03-22 DIAGNOSIS — R69 Illness, unspecified: Secondary | ICD-10-CM | POA: Diagnosis not present

## 2021-03-22 NOTE — Progress Notes (Signed)
NURSING HOME LOCATION:   [facility] ROOM NUMBER:  145 W  CODE STATUS:  DNR  PCP: NP Synthia Innocent NP  This is a nursing facility follow up visit of chronic medical diagnoses & to document compliance with Regulation 483.30 (c) in The Long Term Care Survey Manual Phase 2 which mandates caregiver visit ( visits can alternate among physician, PA or NP as per statutes) within 10 days of 30 days / 60 days/ 90 days post admission to SNF date    Interim medical record and care since last SNF visit was updated with review of diagnostic studies and change in clinical status since last visit were documented.  HPI: She is a permanent resident of this facility with medical diagnoses of adult failure to thrive, history of microcytic anemia, history of elevated liver enzymes, chronic pain syndrome, OAB, hypothyroidism, CKD, and dementia.  Labs are not current, most recently performed/7/22.  At that time CKD stage IIIb was present with creatinine 1.20 and GFR 41.  Protein caloric malnutrition was present with albumin of 3.1 and total protein 6.4, indicating mild protein caloric malnutrition.  ALT was 113 and AST 76 and alkaline phosphatase 598.  There was marked macrocytosis with an MCV of 105.7 with H/H of 11.4/36.7.  The last last B12 level was supratherapeutic in March 2021.  TSH was therapeutic at 1.874.  Review of systems: Dementia invalidated responses. Despite stating she is 33; she cannot give me the correct year. Date given as 1999. She cannot name the POT[employer], stating "I would have known if you had not asked me".  She confabulates about her husband dying recently having been "caught by the enemy".  When I asked what enemy, she stated "the one here and now". She does describe cramps in her legs from the knees to the feet, greater on the right than the left. Initially she stated that she was delighted with how well she was doing and this "wonderful place". She does describe frequent  stools without frank diarrhea.  Physical exam:  Pertinent or positive findings: Initially she was asleep slumped forward in the wheelchair.  She was easily aroused.  She appears younger than her stated age.  Eyebrows are essentially absent.  Left lacrimal gland is prominent.  Dentition is excellent.  Heart rhythm is slightly irregular.  She has low-grade, minor rales.  Pedal pulses are decreased.  Strength to opposition in lower extremities is good.  She does have interosseous wasting of the hands.    General appearance: Adequately nourished; no acute distress, increased work of breathing is present.   Lymphatic: No lymphadenopathy about the head, neck, axilla. Eyes: No conjunctival inflammation or lid edema is present. There is no scleral icterus. Ears:  External ear exam shows no significant lesions or deformities.   Nose:  External nasal examination shows no deformity or inflammation. Nasal mucosa are pink and moist without lesions, exudates Oral exam:  Lips and gums are healthy appearing. There is no oropharyngeal erythema or exudate. Neck:  No thyromegaly, masses, tenderness noted.    Heart:  No gallop, murmur, click, rub .  Lungs: without wheezes, rhonchi,rubs. Abdomen: Bowel sounds are normal. Abdomen is soft and nontender with no organomegaly, hernias, masses. GU: Deferred  Extremities:  No cyanosis, clubbing, edema  Neurologic exam :Balance, Rhomberg, finger to nose testing could not be completed due to clinical state Skin: Warm & dry w/o tenting. No significant lesions or rash.  See summary under each active problem in the  Problem List with associated updated therapeutic plan

## 2021-03-22 NOTE — Assessment & Plan Note (Signed)
Today she confabulates that her husband was caught and killed by the enemy but could not name that entity.  She could not give the date, even the year.  She cannot name the POTUS.  No behavioral issues reported by staff.

## 2021-03-22 NOTE — Patient Instructions (Signed)
See assessment and plan under each diagnosis in the problem list and acutely for this visit 

## 2021-03-22 NOTE — Assessment & Plan Note (Addendum)
Current labs are approximately 74 months old. Update by [facility] NP as clinically indicated as on Hospice.

## 2021-04-13 ENCOUNTER — Other Ambulatory Visit: Payer: Self-pay | Admitting: Adult Health

## 2021-04-13 MED ORDER — OXYCODONE HCL 5 MG PO TABS: 5.0000 mg | ORAL_TABLET | Freq: Three times a day (TID) | ORAL | 0 refills | Status: DC

## 2021-04-15 DIAGNOSIS — Z7689 Persons encountering health services in other specified circumstances: Secondary | ICD-10-CM | POA: Diagnosis not present

## 2021-04-22 DIAGNOSIS — D649 Anemia, unspecified: Secondary | ICD-10-CM | POA: Diagnosis not present

## 2021-04-22 DIAGNOSIS — Z1159 Encounter for screening for other viral diseases: Secondary | ICD-10-CM | POA: Diagnosis not present

## 2021-04-23 ENCOUNTER — Non-Acute Institutional Stay (SKILLED_NURSING_FACILITY): Payer: Medicare Other | Admitting: Adult Health

## 2021-04-23 ENCOUNTER — Encounter: Payer: Self-pay | Admitting: Adult Health

## 2021-04-23 DIAGNOSIS — R627 Adult failure to thrive: Secondary | ICD-10-CM

## 2021-04-23 DIAGNOSIS — N1832 Chronic kidney disease, stage 3b: Secondary | ICD-10-CM | POA: Diagnosis not present

## 2021-04-23 DIAGNOSIS — F01C0 Vascular dementia, severe, without behavioral disturbance, psychotic disturbance, mood disturbance, and anxiety: Secondary | ICD-10-CM

## 2021-04-23 DIAGNOSIS — R69 Illness, unspecified: Secondary | ICD-10-CM | POA: Diagnosis not present

## 2021-04-23 NOTE — Progress Notes (Signed)
Location:  [place] Room Number: 145 Place of Service:  SNF (31)   CODE STATUS: dnr  Allergies  Allergen Reactions   Cephalexin Other (See Comments)    Pt. Doesn't remember.    Cephalexin Other (See Comments)    Pt. Doesn't remember.    Penicillins Other (See Comments)    Pt. Cant remember.     Chief Complaint  Patient presents with   Acute Visit    Care plan meeting.     HPI:  We have come together for her care plan meeting.  BIMS no; mood 5/30: decreased energy; trouble concentrating. She is nonambulatory; no falls. She requires extensive to dependent with her adl care. She is frequently incontinent of bladder and bowel.  Dietary: feeds herself; on regular diet; appetite appetite is variable; weight is 120 pounds. Therapy: none at this time. She is followed by hospice She continues to be followed for her chronic illnesses including: Vascular dementia without behavioral disturbance psychotic disturbance mood disturbance or anxiety  Stage 3b chronic kidney disease Failure to thrive in adult.   Past Medical History:  Diagnosis Date   Thyroid disease     Past Surgical History:  Procedure Laterality Date   ABDOMINAL HYSTERECTOMY     TONSILLECTOMY     TOTAL HIP ARTHROPLASTY     left    Social History   Socioeconomic History   Marital status: Widowed    Spouse name: Not on file   Number of children: Not on file   Years of education: Not on file   Highest education level: Not on file  Occupational History   Occupation: retired   Tobacco Use   Smoking status: Never   Smokeless tobacco: Never  Vaping Use   Vaping Use: Never used  Substance and Sexual Activity   Alcohol use: No   Drug use: No   Sexual activity: Not Currently  Other Topics Concern   Not on file  Social History Narrative   Long term resident of [facility] .   Social Determinants of Health   Financial Resource Strain: Not on file  Food Insecurity: Not on file   Transportation Needs: Not on file  Physical Activity: Not on file  Stress: Not on file  Social Connections: Not on file  Intimate Partner Violence: Not on file   Family History  Problem Relation Age of Onset   Heart attack Mother    Heart attack Father    Hypertension Father       VITAL SIGNS BP (!) 120/56    Pulse 66    Temp (!) 97.1 F (36.2 C)    Resp 16    Ht 5' 5"  (1.651 m)    Wt 128 lb (58.1 kg)    SpO2 90%    BMI 21.30 kg/m   Outpatient Encounter Medications as of 04/23/2021  Medication Sig   acetaminophen (TYLENOL) 650 MG CR tablet Take 650 mg by mouth every 8 (eight) hours as needed.   Balsam [place]-Castor Oil [facility]) OINT Special Instructions: Apply to sacrum and bilateral buttocks qshift for prevention. Every Shift Day, Evening, Night   busPIRone (BUSPAR) 5 MG tablet Take 5 mg by mouth daily in the afternoon.   Casanthranol-Docusate Sodium (STOOL SOFTENER/STIMULANT LAXAT PO) Take 1 tablet by mouth in the morning and at bedtime. 70m/8.6mg   feeding supplement, ENSURE ENLIVE, (ENSURE ENLIVE) LIQD Take 237 mLs by mouth daily in the afternoon.   hydrocortisone cream 1 % Apply 1 application  topically daily as needed.   levothyroxine (SYNTHROID) 100 MCG tablet Take 100 mcg by mouth daily.   loratadine (CLARITIN) 10 MG tablet Take 10 mg by mouth daily. Chronic Allergies   mirabegron ER (MYRBETRIQ) 25 MG TB24 tablet Take 25 mg by mouth daily.   NON FORMULARY Regular diet   oxyCODONE (OXY IR/ROXICODONE) 5 MG immediate release tablet Take 1 tablet (5 mg total) by mouth every 8 (eight) hours.   Pramox-PE-Glycerin-Petrolatum (HEMORRHOIDAL EX) Apply topically. 0.25-1 %; rectal Special Instructions: Apply rectally daily PRN hemorrhoids   Vitamins A & D (VITAMIN A & D) ointment Apply 1 application topically in the morning, at noon, and at bedtime. Apply to left and right heel for prevention each shift   witch hazel-glycerin (TUCKS) pad Apply 1 application topically 3 (three) times  daily as needed for itching. Special Instructions: Apply Tucks pad to hemorrhoids   No facility-administered encounter medications on file as of 04/23/2021.     SIGNIFICANT DIAGNOSTIC EXAMS   LABS REVIEWED PREVIOUS  10-31-19: wbc 7.6; hg 11.8; hct 38.5; mcv 104.6 plt 311; glucose 156; bun 68; creat 1.31; k+ 4.0; na++ 139; ca 9.2 07-16-20: wbc 4.3; hgb 11.4; hct 36.9; mcv 105.7 plt 234; glucose 84; bun 63; creat 1.20; k+ 4.6; na++ 140; ca 9.3 GFR 41; ast 76; alt 113; alk phos 598; albumin 3.1 tsh 1.874  NO NEW LABS.   Review of Systems  Unable to perform ROS: Dementia (unable to participate)   Physical Exam Constitutional:      General: She is not in acute distress.    Appearance: She is well-developed. She is not diaphoretic.  Neck:     Thyroid: No thyromegaly.  Cardiovascular:     Rate and Rhythm: Normal rate and regular rhythm.     Pulses: Normal pulses.     Heart sounds: Normal heart sounds.  Pulmonary:     Effort: Pulmonary effort is normal. No respiratory distress.     Breath sounds: Normal breath sounds.  Abdominal:     General: Bowel sounds are normal. There is no distension.     Palpations: Abdomen is soft.     Tenderness: There is no abdominal tenderness.  Musculoskeletal:        General: Normal range of motion.     Cervical back: Neck supple.     Right lower leg: No edema.     Left lower leg: No edema.  Lymphadenopathy:     Cervical: No cervical adenopathy.  Skin:    General: Skin is warm and dry.  Neurological:     Mental Status: She is alert. Mental status is at baseline.  Psychiatric:        Mood and Affect: Mood normal.    ASSESSMENT/ PLAN:  TODAY  Vascular dementia without behavioral disturbance psychotic disturbance mood disturbance or anxiety Stage 3b chronic kidney disease Failure to thrive in adult.   Will continue current medications Will continue current plan of care Will continue to monitor her status.   Time spent with patient: 40  minutes; care plan medications   Ok Edwards NP [facility]   call [phone]

## 2021-04-28 ENCOUNTER — Encounter: Payer: Self-pay | Admitting: Adult Health

## 2021-04-28 ENCOUNTER — Non-Acute Institutional Stay (SKILLED_NURSING_FACILITY): Payer: Medicare Other | Admitting: Adult Health

## 2021-04-28 DIAGNOSIS — E039 Hypothyroidism, unspecified: Secondary | ICD-10-CM | POA: Diagnosis not present

## 2021-04-28 DIAGNOSIS — E876 Hypokalemia: Secondary | ICD-10-CM

## 2021-04-28 NOTE — Progress Notes (Signed)
Location:  [place] Room Number: 145-W Place of Service:  SNF (31)   CODE STATUS: DNR  Allergies  Allergen Reactions   Cephalexin Other (See Comments)    Pt. Doesn't remember.    Cephalexin Other (See Comments)    Pt. Doesn't remember.    Penicillins Other (See Comments)    Pt. Cant remember.     Chief Complaint  Patient presents with   Medical Management of Chronic Issues                  Hypokalemia: Failure to thrive in adult:  Acquired hypothyroidism:    HPI:  She is a 88 year old long term resident of this facility being seen for the management of her chronic illnesses: Hypokalemia: Failure to thrive in adult:  Acquired hypothyroidism. There are no reports of uncontrolled pain. She has gained several pounds over the past month. There are no reports of anxiety or agitation.   Past Medical History:  Diagnosis Date   Thyroid disease     Past Surgical History:  Procedure Laterality Date   ABDOMINAL HYSTERECTOMY     TONSILLECTOMY     TOTAL HIP ARTHROPLASTY     left    Social History   Socioeconomic History   Marital status: Widowed    Spouse name: Not on file   Number of children: Not on file   Years of education: Not on file   Highest education level: Not on file  Occupational History   Occupation: retired   Tobacco Use   Smoking status: Never   Smokeless tobacco: Never  Vaping Use   Vaping Use: Never used  Substance and Sexual Activity   Alcohol use: No   Drug use: No   Sexual activity: Not Currently  Other Topics Concern   Not on file  Social History Narrative   Long term resident of [facility] .   Social Determinants of Health   Financial Resource Strain: Not on file  Food Insecurity: Not on file  Transportation Needs: Not on file  Physical Activity: Not on file  Stress: Not on file  Social Connections: Not on file  Intimate Partner Violence: Not on file   Family History  Problem Relation Age of Onset   Heart attack  Mother    Heart attack Father    Hypertension Father       VITAL SIGNS BP 118/76    Pulse 80    Temp 99.1 F (37.3 C)    Resp 16    Ht _0  (1.549 m)    Wt 134 lb (60.8 kg)    SpO2 90%    BMI 25.32 kg/m   Outpatient Encounter Medications as of 04/28/2021  Medication Sig   acetaminophen (TYLENOL) 650 MG CR tablet Take 650 mg by mouth every 8 (eight) hours as needed.   Balsam [place]-Castor Oil [facility]) OINT Special Instructions: Apply to sacrum and bilateral buttocks qshift for prevention. Every Shift Day, Evening, Night   busPIRone (BUSPAR) 5 MG tablet Take 5 mg by mouth daily in the afternoon.   Casanthranol-Docusate Sodium (STOOL SOFTENER/STIMULANT LAXAT PO) Take 1 tablet by mouth in the morning and at bedtime. 75m/8.6mg   hydrocortisone cream 1 % Apply 1 application topically daily as needed.   levothyroxine (SYNTHROID) 100 MCG tablet Take 100 mcg by mouth daily.   loratadine (CLARITIN) 10 MG tablet Take 10 mg by mouth daily. Chronic Allergies   mirabegron ER (MYRBETRIQ) 25 MG TB24 tablet Take  25 mg by mouth daily.   NON FORMULARY Regular diet   oxyCODONE (OXY IR/ROXICODONE) 5 MG immediate release tablet Take 1 tablet (5 mg total) by mouth every 8 (eight) hours.   Pramox-PE-Glycerin-Petrolatum (HEMORRHOIDAL EX) Apply topically. 0.25-1 %; rectal Special Instructions: Apply rectally daily PRN hemorrhoids   Vitamins A & D (VITAMIN A & D) ointment Apply 1 application topically in the morning, at noon, and at bedtime. Apply to left and right heel for prevention each shift   witch hazel-glycerin (TUCKS) pad Apply 1 application topically 3 (three) times daily as needed for itching. Special Instructions: Apply Tucks pad to hemorrhoids   [DISCONTINUED] feeding supplement, ENSURE ENLIVE, (ENSURE ENLIVE) LIQD Take 237 mLs by mouth daily in the afternoon.   No facility-administered encounter medications on file as of 04/28/2021.     SIGNIFICANT DIAGNOSTIC EXAMS   NO NEW EXAMS.    LABS  REVIEWED PREVIOUS  10-31-19: wbc 7.6; hg 11.8; hct 38.5; mcv 104.6 plt 311; glucose 156; bun 68; creat 1.31; k+ 4.0; na++ 139; ca 9.2 07-16-20: wbc 4.3; hgb 11.4; hct 36.9; mcv 105.7 plt 234; glucose 84; bun 63; creat 1.20; k+ 4.6; na++ 140; ca 9.3 GFR 41; ast 76; alt 113; alk phos 598; albumin 3.1 tsh 1.874  NO NEW LABS.   Review of Systems  Unable to perform ROS: Dementia    Physical Exam Constitutional:      General: She is not in acute distress.    Appearance: She is well-developed. She is not diaphoretic.  Neck:     Thyroid: No thyromegaly.  Cardiovascular:     Rate and Rhythm: Normal rate and regular rhythm.     Pulses: Normal pulses.     Heart sounds: Normal heart sounds.  Pulmonary:     Effort: Pulmonary effort is normal. No respiratory distress.     Breath sounds: Normal breath sounds.  Abdominal:     General: Bowel sounds are normal. There is no distension.     Palpations: Abdomen is soft.     Tenderness: There is no abdominal tenderness.  Musculoskeletal:        General: Normal range of motion.     Cervical back: Neck supple.     Right lower leg: No edema.     Left lower leg: No edema.  Lymphadenopathy:     Cervical: No cervical adenopathy.  Skin:    General: Skin is warm and dry.  Neurological:     Mental Status: She is alert. Mental status is at baseline.  Psychiatric:        Mood and Affect: Mood normal.     ASSESSMENT/ PLAN:  TODAY  Hypokalemia: k+ 4.6 will monitor  2. Failure to thrive in adult: weight is 134 pounds; albumin 3.1 will continue supplements as directed; unable to tolerate remeron  3. Acquired hypothyroidism: is stable tsh 1.874 will continue sysnthroid 100 mcg daily    PREVIOUS   4. Urinary incontinence: is stable will continue  myrbetriq 25 mg daily and will monitor her response  5. Chronic constipation: will continue colace twice daily   6. CKD stage 3b is stable bun 68; creat 1.37 will monitor   7. Advanced dementia: is  without change: weight is 134 ppounds; will continue buspar 5 mg daily to help control anxiety  8. Chronic generalized pain: is stable will continue oxycodone 5 mg every 8 hours  9. Elevated liver enzymes: ast 76; alt 113; alk phos 598; will monitor    Ok Edwards  NP [place] [facility]   call [phone]

## 2021-04-29 DIAGNOSIS — Z1159 Encounter for screening for other viral diseases: Secondary | ICD-10-CM | POA: Diagnosis not present

## 2021-04-29 DIAGNOSIS — D649 Anemia, unspecified: Secondary | ICD-10-CM | POA: Diagnosis not present

## 2021-05-06 DIAGNOSIS — D649 Anemia, unspecified: Secondary | ICD-10-CM | POA: Diagnosis not present

## 2021-05-06 DIAGNOSIS — Z1159 Encounter for screening for other viral diseases: Secondary | ICD-10-CM | POA: Diagnosis not present

## 2021-05-13 ENCOUNTER — Other Ambulatory Visit: Payer: Self-pay | Admitting: Adult Health

## 2021-05-13 DIAGNOSIS — D649 Anemia, unspecified: Secondary | ICD-10-CM | POA: Diagnosis not present

## 2021-05-13 DIAGNOSIS — Z1159 Encounter for screening for other viral diseases: Secondary | ICD-10-CM | POA: Diagnosis not present

## 2021-05-13 MED ORDER — OXYCODONE HCL 5 MG PO TABS: 5.0000 mg | ORAL_TABLET | Freq: Three times a day (TID) | ORAL | 0 refills | Status: DC

## 2021-05-20 DIAGNOSIS — Z1159 Encounter for screening for other viral diseases: Secondary | ICD-10-CM | POA: Diagnosis not present

## 2021-05-20 DIAGNOSIS — D649 Anemia, unspecified: Secondary | ICD-10-CM | POA: Diagnosis not present

## 2021-05-27 DIAGNOSIS — D649 Anemia, unspecified: Secondary | ICD-10-CM | POA: Diagnosis not present

## 2021-05-27 DIAGNOSIS — Z1159 Encounter for screening for other viral diseases: Secondary | ICD-10-CM | POA: Diagnosis not present

## 2021-05-28 ENCOUNTER — Encounter: Payer: Self-pay | Admitting: Adult Health

## 2021-05-28 ENCOUNTER — Non-Acute Institutional Stay (SKILLED_NURSING_FACILITY): Payer: Medicare HMO | Admitting: Adult Health

## 2021-05-28 DIAGNOSIS — N1832 Chronic kidney disease, stage 3b: Secondary | ICD-10-CM

## 2021-05-28 DIAGNOSIS — K5909 Other constipation: Secondary | ICD-10-CM

## 2021-05-28 DIAGNOSIS — N3281 Overactive bladder: Secondary | ICD-10-CM

## 2021-05-28 NOTE — Progress Notes (Signed)
Location:  [place] Room Number: 145-W Place of Service:  SNF (31)   CODE STATUS: DNR  Allergies  Allergen Reactions   Cephalexin Other (See Comments)    Pt. Doesn't remember.    Cephalexin Other (See Comments)    Pt. Doesn't remember.    Penicillins Other (See Comments)    Pt. Cant remember.    Chief Complaint  Patient presents with   Medical Management of Chronic Issues        Urinary incontinence:  Chronic constipation: CKD stage 3b     HPI:  She is a 88 year old long term resident of this facility being seen for the management of her chronic illnesses:  Urinary incontinence:  Chronic constipation: CKD stage 3b. There are no reports of uncontrolled pain. Weight is stable. No reports of depressive thoughts.   Past Medical History:  Diagnosis Date   Hypokalemia 06/11/2019   Thyroid disease     Past Surgical History:  Procedure Laterality Date   ABDOMINAL HYSTERECTOMY     TONSILLECTOMY     TOTAL HIP ARTHROPLASTY     left    Social History   Socioeconomic History   Marital status: Widowed    Spouse name: Not on file   Number of children: Not on file   Years of education: Not on file   Highest education level: Not on file  Occupational History   Occupation: retired   Tobacco Use   Smoking status: Never   Smokeless tobacco: Never  Vaping Use   Vaping Use: Never used  Substance and Sexual Activity   Alcohol use: No   Drug use: No   Sexual activity: Not Currently  Other Topics Concern   Not on file  Social History Narrative   Long term resident of [facility] .   Social Determinants of Health   Financial Resource Strain: Not on file  Food Insecurity: Not on file  Transportation Needs: Not on file  Physical Activity: Not on file  Stress: Not on file  Social Connections: Not on file  Intimate Partner Violence: Not on file   Family History  Problem Relation Age of Onset   Heart attack Mother    Heart attack Father     Hypertension Father       VITAL SIGNS BP 127/78    Pulse 66    Temp 99.1 F (37.3 C)    Resp 16    Ht 5' 1"  (1.549 m)    Wt 134 lb (60.8 kg)    SpO2 90%    BMI 25.32 kg/m   Outpatient Encounter Medications as of 05/28/2021  Medication Sig   acetaminophen (TYLENOL) 650 MG CR tablet Take 650 mg by mouth every 8 (eight) hours as needed.   Balsam [place]-Castor Oil [facility]) OINT Special Instructions: Apply to sacrum and bilateral buttocks qshift for prevention. Every Shift Day, Evening, Night   hydrocortisone cream 1 % Apply 1 application topically daily as needed.   levothyroxine (SYNTHROID) 100 MCG tablet Take 100 mcg by mouth daily.   loratadine (CLARITIN) 10 MG tablet Take 10 mg by mouth daily. Chronic Allergies   mirabegron ER (MYRBETRIQ) 25 MG TB24 tablet Take 25 mg by mouth daily.   NON FORMULARY Regular diet   oxyCODONE (OXY IR/ROXICODONE) 5 MG immediate release tablet Take 1 tablet (5 mg total) by mouth every 8 (eight) hours.   Pramox-PE-Glycerin-Petrolatum (HEMORRHOIDAL EX) Apply topically. 0.25-1 %; rectal Special Instructions: Apply rectally daily PRN hemorrhoids  senna-docusate (STIMULANT LAXATIVE) 8.6-50 MG tablet Take 1 tablet by mouth 2 (two) times daily.   Vitamins A & D (VITAMIN A & D) ointment Apply 1 application topically in the morning, at noon, and at bedtime. Apply to left and right heel for prevention each shift   witch hazel-glycerin (TUCKS) pad Apply 1 application topically 3 (three) times daily as needed for itching. Special Instructions: Apply Tucks pad to hemorrhoids   [DISCONTINUED] busPIRone (BUSPAR) 5 MG tablet Take 5 mg by mouth daily in the afternoon.   [DISCONTINUED] Casanthranol-Docusate Sodium (STOOL SOFTENER/STIMULANT LAXAT PO) Take 1 tablet by mouth in the morning and at bedtime. 61m/8.6mg   No facility-administered encounter medications on file as of 05/28/2021.     SIGNIFICANT DIAGNOSTIC EXAMS  NO NEW EXAMS.    LABS REVIEWED  PREVIOUS  10-31-19: wbc 7.6; hg 11.8; hct 38.5; mcv 104.6 plt 311; glucose 156; bun 68; creat 1.31; k+ 4.0; na++ 139; ca 9.2 07-16-20: wbc 4.3; hgb 11.4; hct 36.9; mcv 105.7 plt 234; glucose 84; bun 63; creat 1.20; k+ 4.6; na++ 140; ca 9.3 GFR 41; ast 76; alt 113; alk phos 598; albumin 3.1 tsh 1.874  NO NEW LABS.   Review of Systems  Unable to perform ROS: Dementia   Physical Exam Constitutional:      General: She is not in acute distress.    Appearance: She is well-developed. She is not diaphoretic.  Neck:     Thyroid: No thyromegaly.  Cardiovascular:     Rate and Rhythm: Normal rate and regular rhythm.     Pulses: Normal pulses.     Heart sounds: Normal heart sounds.  Pulmonary:     Effort: Pulmonary effort is normal. No respiratory distress.     Breath sounds: Normal breath sounds.  Abdominal:     General: Bowel sounds are normal. There is no distension.     Palpations: Abdomen is soft.     Tenderness: There is no abdominal tenderness.  Musculoskeletal:        General: Normal range of motion.     Cervical back: Neck supple.     Right lower leg: No edema.     Left lower leg: No edema.  Lymphadenopathy:     Cervical: No cervical adenopathy.  Skin:    General: Skin is warm and dry.  Neurological:     Mental Status: She is alert. Mental status is at baseline.  Psychiatric:        Mood and Affect: Mood normal.        ASSESSMENT/ PLAN:  TODAY  Urinary incontinence: is stable will continue myrbetriq 25 mg daily   2. Chronic constipation: will continue colace twice daily   3. CKD stage 3b is stable bun 68; creat 1.37    PREVIOUS    4. Advanced dementia: is without change: weight is 134 ppounds; will continue buspar 5 mg daily to help control anxiety  5. Chronic generalized pain: is stable will continue oxycodone 5 mg every 8 hours  6. Elevated liver enzymes: ast 76; alt 113; alk phos 598; will monitor   7. Hypokalemia: k+ 4.6 will monitor  8. Failure to thrive  in adult: weight is 134 pounds; albumin 3.1 will continue supplements as directed; unable to tolerate remeron  9. Acquired hypothyroidism: is stable tsh 1.874 will continue sysnthroid 100 mcg daily      DOk EdwardsNP [facility]   call [phone]

## 2021-06-03 ENCOUNTER — Non-Acute Institutional Stay (SKILLED_NURSING_FACILITY): Payer: Medicare HMO | Admitting: Adult Health

## 2021-06-03 ENCOUNTER — Encounter: Payer: Self-pay | Admitting: Adult Health

## 2021-06-03 DIAGNOSIS — F419 Anxiety disorder, unspecified: Secondary | ICD-10-CM | POA: Diagnosis not present

## 2021-06-03 DIAGNOSIS — D649 Anemia, unspecified: Secondary | ICD-10-CM | POA: Diagnosis not present

## 2021-06-03 DIAGNOSIS — F01C0 Vascular dementia, severe, without behavioral disturbance, psychotic disturbance, mood disturbance, and anxiety: Secondary | ICD-10-CM | POA: Diagnosis not present

## 2021-06-03 DIAGNOSIS — Z1159 Encounter for screening for other viral diseases: Secondary | ICD-10-CM | POA: Diagnosis not present

## 2021-06-03 NOTE — Progress Notes (Signed)
Location:  [place] Room Number: 145 Place of Service:  SNF (31)   CODE STATUS: dnr   Allergies  Allergen Reactions   Cephalexin Other (See Comments)    Pt. Doesn't remember.    Cephalexin Other (See Comments)    Pt. Doesn't remember.    Penicillins Other (See Comments)    Pt. Cant remember.     Chief Complaint  Patient presents with   Acute Visit    Anxiety and hallucinations     HPI:  She is being weaned off her buspar for her GDR. She has become more anxious; has been having hallucinations. She has called her son anxious. The hospice staff feel that she has failed her wean.   Past Medical History:  Diagnosis Date   Hypokalemia 06/11/2019   Thyroid disease     Past Surgical History:  Procedure Laterality Date   ABDOMINAL HYSTERECTOMY     TONSILLECTOMY     TOTAL HIP ARTHROPLASTY     left    Social History   Socioeconomic History   Marital status: Widowed    Spouse name: Not on file   Number of children: Not on file   Years of education: Not on file   Highest education level: Not on file  Occupational History   Occupation: retired   Tobacco Use   Smoking status: Never   Smokeless tobacco: Never  Vaping Use   Vaping Use: Never used  Substance and Sexual Activity   Alcohol use: No   Drug use: No   Sexual activity: Not Currently  Other Topics Concern   Not on file  Social History Narrative   Long term resident of [facility] .   Social Determinants of Health   Financial Resource Strain: Not on file  Food Insecurity: Not on file  Transportation Needs: Not on file  Physical Activity: Not on file  Stress: Not on file  Social Connections: Not on file  Intimate Partner Violence: Not on file   Family History  Problem Relation Age of Onset   Heart attack Mother    Heart attack Father    Hypertension Father       VITAL SIGNS BP 123/61    Pulse 80    Temp (!) 97.4 F (36.3 C)    Ht 5' 1"  (1.549 m)    Wt 134 lb (60.8 kg)     BMI 25.32 kg/m   Outpatient Encounter Medications as of 06/03/2021  Medication Sig   acetaminophen (TYLENOL) 650 MG CR tablet Take 650 mg by mouth every 8 (eight) hours as needed.   Balsam [place]-Castor Oil [facility]) OINT Special Instructions: Apply to sacrum and bilateral buttocks qshift for prevention. Every Shift Day, Evening, Night   hydrocortisone cream 1 % Apply 1 application topically daily as needed.   levothyroxine (SYNTHROID) 100 MCG tablet Take 100 mcg by mouth daily.   loratadine (CLARITIN) 10 MG tablet Take 10 mg by mouth daily. Chronic Allergies   mirabegron ER (MYRBETRIQ) 25 MG TB24 tablet Take 25 mg by mouth daily.   NON FORMULARY Regular diet   oxyCODONE (OXY IR/ROXICODONE) 5 MG immediate release tablet Take 1 tablet (5 mg total) by mouth every 8 (eight) hours.   Pramox-PE-Glycerin-Petrolatum (HEMORRHOIDAL EX) Apply topically. 0.25-1 %; rectal Special Instructions: Apply rectally daily PRN hemorrhoids   senna-docusate (STIMULANT LAXATIVE) 8.6-50 MG tablet Take 1 tablet by mouth 2 (two) times daily.   Vitamins A & D (VITAMIN A & D) ointment Apply 1  application topically in the morning, at noon, and at bedtime. Apply to left and right heel for prevention each shift   witch hazel-glycerin (TUCKS) pad Apply 1 application topically 3 (three) times daily as needed for itching. Special Instructions: Apply Tucks pad to hemorrhoids   No facility-administered encounter medications on file as of 06/03/2021.     SIGNIFICANT DIAGNOSTIC EXAMS  NO NEW EXAMS.    LABS REVIEWED PREVIOUS  10-31-19: wbc 7.6; hg 11.8; hct 38.5; mcv 104.6 plt 311; glucose 156; bun 68; creat 1.31; k+ 4.0; na++ 139; ca 9.2 07-16-20: wbc 4.3; hgb 11.4; hct 36.9; mcv 105.7 plt 234; glucose 84; bun 63; creat 1.20; k+ 4.6; na++ 140; ca 9.3 GFR 41; ast 76; alt 113; alk phos 598; albumin 3.1 tsh 1.874  NO NEW LABS.   Review of Systems  Unable to perform ROS: Dementia     Physical Exam Constitutional:       General: She is not in acute distress.    Appearance: She is well-developed. She is not diaphoretic.  Neck:     Thyroid: No thyromegaly.  Cardiovascular:     Rate and Rhythm: Normal rate and regular rhythm.     Heart sounds: Normal heart sounds.  Pulmonary:     Effort: Pulmonary effort is normal. No respiratory distress.     Breath sounds: Normal breath sounds.  Abdominal:     General: Bowel sounds are normal. There is no distension.     Palpations: Abdomen is soft.     Tenderness: There is no abdominal tenderness.  Musculoskeletal:        General: Normal range of motion.     Cervical back: Neck supple.     Right lower leg: No edema.     Left lower leg: No edema.  Lymphadenopathy:     Cervical: No cervical adenopathy.  Skin:    General: Skin is warm and dry.  Neurological:     Mental Status: She is alert. Mental status is at baseline.  Psychiatric:        Mood and Affect: Mood normal.      ASSESSMENT/ PLAN:  TODAY  Anxiety Severe vascular dementia without behavioral disturbance; psychotic disturbance mood disturbance; anxiety;  Is worse will restart buspar 5 mg daily    Ok Edwards NP [facility]   call [phone]

## 2021-06-09 ENCOUNTER — Other Ambulatory Visit: Payer: Self-pay | Admitting: Adult Health

## 2021-06-09 MED ORDER — OXYCODONE HCL 5 MG PO TABS: 5.0000 mg | ORAL_TABLET | Freq: Three times a day (TID) | ORAL | 0 refills | Status: DC

## 2021-06-10 DIAGNOSIS — Z1159 Encounter for screening for other viral diseases: Secondary | ICD-10-CM | POA: Diagnosis not present

## 2021-06-10 DIAGNOSIS — D649 Anemia, unspecified: Secondary | ICD-10-CM | POA: Diagnosis not present

## 2021-06-25 ENCOUNTER — Other Ambulatory Visit: Payer: Self-pay | Admitting: Adult Health

## 2021-06-25 MED ORDER — OXYCODONE HCL 5 MG PO TABS: 5.0000 mg | ORAL_TABLET | Freq: Three times a day (TID) | ORAL | 0 refills | Status: DC

## 2021-06-28 ENCOUNTER — Non-Acute Institutional Stay (SKILLED_NURSING_FACILITY): Payer: Medicare HMO | Admitting: Internal Medicine

## 2021-06-28 ENCOUNTER — Encounter: Payer: Self-pay | Admitting: Internal Medicine

## 2021-06-28 DIAGNOSIS — D539 Nutritional anemia, unspecified: Secondary | ICD-10-CM

## 2021-06-28 DIAGNOSIS — F01C0 Vascular dementia, severe, without behavioral disturbance, psychotic disturbance, mood disturbance, and anxiety: Secondary | ICD-10-CM | POA: Diagnosis not present

## 2021-06-28 DIAGNOSIS — E039 Hypothyroidism, unspecified: Secondary | ICD-10-CM

## 2021-06-28 DIAGNOSIS — N1832 Chronic kidney disease, stage 3b: Secondary | ICD-10-CM

## 2021-06-28 NOTE — Progress Notes (Signed)
? ?  NURSING HOME LOCATION: [place] ?ROOM NUMBER:  145 W ? ?CODE STATUS:  DNR ? ?PCP: Ok Edwards NP,PSC ? ?This is a nursing facility follow up visit of chronic medical diagnoses & to document compliance with Regulation 483.30 (c) in [employer] [place] Phase [employer] which mandates caregiver visit ( visits can alternate among physician, PA or NP as per statutes) within 10 days of 30 days / 60 days/ 90 days post admission to SNF date   ? ?Interim medical record and care since last SNF visit was updated with review of diagnostic studies and change in clinical status since last visit were documented. ? ?HPI: She is a permanent resident of the facility with diagnoses of hypothyroidism, osteoarthritis, advanced dementia, CKD stage III, and history of macrocytic anemia. ? ?There has been no lab updates since 07/16/2020.  At that time she had CKD stage IIIb with a creatinine of 1.20 and GFR 41.  She has had consistently elevated alk phos with the most recent value 598.  Borderline protein/caloric malnutrition was suggested by albumin of 3.1 and total protein of 6.4.  The most recent H/H were 11.4/36.9 with an MCV of 105.7.  The B12 level was supranormal at 1014 on 07/04/2019.  TSH was therapeutic at 1.874. ? ?Review of systems: Dementia invalidated responses.  Her only verbal responses was "just here and that is just it".  She did express that she stayed "cold'.  She also describes knee and feet pain with more discomfort in the feet.  At the end of the interview she stated "thank you for your help". ? ?Physical exam:  ?Pertinent or positive findings: When I entered she was asleep in the wheelchair with her neck flexed forward.  There was no evidence of apnea or snoring.  Upon awakening she interacted but was obviously demented as noted.  She has bilateral ptosis.  Her voice is very weak.  Heart sounds are distant.  Breath sounds are decreased as well.  Bowel sounds are very active.  All  containers at her bedside were empty indicating that she had eaten recently.  She has trace edema at the sock line.  Pedal pulses are decreased.  She has interosseous wasting of the hands.  Fusiform changes of the knees are noted. ? ?General appearance: no acute distress, increased work of breathing is present.  ?Lymphatic: No lymphadenopathy about the head, neck, axilla. ?Eyes: No conjunctival inflammation or lid edema is present. There is no scleral icterus. ?Ears:  External ear exam shows no significant lesions or deformities.   ?Nose:  External nasal examination shows no deformity or inflammation. Nasal mucosa are pink and moist without lesions, exudates ?Oral exam:  Lips and gums are healthy appearing. There is no oropharyngeal erythema or exudate. ?Neck:  No thyromegaly, masses, tenderness noted.    ?Heart:  No gallop, murmur, click, rub .  ?Lungs:  without wheezes, rhonchi, rales, rubs. ?Abdomen: Bowel sounds are normal. Abdomen is soft and nontender with no organomegaly, hernias, masses. ?GU: Deferred  ?Extremities:  No cyanosis, clubbing  ?Neurologic exam :Balance, Rhomberg, finger to nose testing could not be completed due to clinical state ?Skin: Warm & dry w/o tenting. ?No significant lesions or rash. ? ?See summary under each active problem in the Problem List with associated updated therapeutic plan ? ? ?

## 2021-06-29 ENCOUNTER — Encounter: Payer: Self-pay | Admitting: Internal Medicine

## 2021-06-29 NOTE — Assessment & Plan Note (Addendum)
On 100 mcg L thyroxine. ?She describes "staying cold"; TSH will be updated. ?

## 2021-06-29 NOTE — Patient Instructions (Signed)
See assessment and plan under each diagnosis in the problem list and acutely for this visit 

## 2021-06-29 NOTE — Assessment & Plan Note (Signed)
GFR of 41 indicates CKD Stage 3 b. No nephrotoxic meds identified. ?

## 2021-06-29 NOTE — Assessment & Plan Note (Signed)
No bleeding dyscrasias reported by staff. B12 level was supranormal. ?

## 2021-06-29 NOTE — Assessment & Plan Note (Signed)
She is unable to provide any meaningful history . No behavioral issues reported. ?

## 2021-07-15 ENCOUNTER — Non-Acute Institutional Stay (SKILLED_NURSING_FACILITY): Payer: Medicare HMO | Admitting: Adult Health

## 2021-07-15 DIAGNOSIS — L129 Pemphigoid, unspecified: Secondary | ICD-10-CM

## 2021-07-18 DIAGNOSIS — Z1159 Encounter for screening for other viral diseases: Secondary | ICD-10-CM | POA: Diagnosis not present

## 2021-07-18 DIAGNOSIS — D649 Anemia, unspecified: Secondary | ICD-10-CM | POA: Diagnosis not present

## 2021-07-21 ENCOUNTER — Encounter: Payer: Self-pay | Admitting: Adult Health

## 2021-07-21 NOTE — Progress Notes (Signed)
? ?Location:  [place] ?Nursing Home Room Number: 145 ?Place of Service:  SNF (31) ? ? ?CODE STATUS: dnr  ? ?Allergies  ?Allergen Reactions  ? Cephalexin Other (See Comments)  ?  Pt. Doesn't remember.   ? Cephalexin Other (See Comments)  ?  Pt. Doesn't remember.   ? Penicillins Other (See Comments)  ?  Pt. Cant remember.   ? ? ?Chief Complaint  ?Patient presents with  ? Acute Visit  ?  Right hand swelling   ? ? ?HPI: ? ?Her right hand is more red; swollen with bullae present. Most bullae are clear few are yellow filled. She does have some on her back and right lower extremity. She denies any pain. There are no reports of fever present.  ? ?Past Medical History:  ?Diagnosis Date  ? Hypokalemia 06/11/2019  ? Thyroid disease   ? ? ?Past Surgical History:  ?Procedure Laterality Date  ? ABDOMINAL HYSTERECTOMY    ? TONSILLECTOMY    ? TOTAL HIP ARTHROPLASTY    ? left  ? ? ?Social History  ? ?Socioeconomic History  ? Marital status: Widowed  ?  Spouse name: Not on file  ? Number of children: Not on file  ? Years of education: Not on file  ? Highest education level: Not on file  ?Occupational History  ? Occupation: retired   ?Tobacco Use  ? Smoking status: Never  ? Smokeless tobacco: Never  ?Vaping Use  ? Vaping Use: Never used  ?Substance and Sexual Activity  ? Alcohol use: No  ? Drug use: No  ? Sexual activity: Not Currently  ?Other Topics Concern  ? Not on file  ?Social History Narrative  ? Long term resident of [place] .  ? ?Social Determinants of Health  ? ?Financial Resource Strain: Not on file  ?Food Insecurity: Not on file  ?Transportation Needs: Not on file  ?Physical Activity: Not on file  ?Stress: Not on file  ?Social Connections: Not on file  ?Intimate Partner Violence: Not on file  ? ?Family History  ?Problem Relation Age of Onset  ? Heart attack Mother   ? Heart attack Father   ? Hypertension Father   ? ? ? ? ?VITAL SIGNS ?BP 132/70   Pulse 70   Temp 97.6 ?F (36.4 ?C)   Resp 18   Ht 5' 1"  (1.549 m)    Wt 134 lb (60.8 kg)   BMI 25.32 kg/m?  ? ?Outpatient Encounter Medications as of 07/15/2021  ?Medication Sig  ? acetaminophen (TYLENOL) 650 MG CR tablet Take 650 mg by mouth every 8 (eight) hours as needed.  ? Balsam [employer], [employer] [facility]) OINT Special Instructions: Apply to sacrum and bilateral buttocks qshift for prevention. ?Every Shift ?Day, Evening, Night  ? hydrocortisone cream 1 % Apply 1 application topically daily as needed.  ? levothyroxine (SYNTHROID) 100 MCG tablet Take 100 mcg by mouth daily.  ? loratadine (CLARITIN) 10 MG tablet Take 10 mg by mouth daily. Chronic Allergies  ? mirabegron ER (MYRBETRIQ) 25 MG TB24 tablet Take 25 mg by mouth daily.  ? NON FORMULARY Regular diet  ? oxyCODONE (OXY IR/ROXICODONE) 5 MG immediate release tablet Take 1 tablet (5 mg total) by mouth every 8 (eight) hours.  ? Pramox-PE-Glycerin-Petrolatum (HEMORRHOIDAL EX) Apply topically. 0.25-1 %; rectal ?Special Instructions: Apply rectally daily PRN hemorrhoids  ? senna-docusate (STIMULANT LAXATIVE) 8.6-50 MG tablet Take 1 tablet by mouth 2 (two) times daily.  ? Vitamins A & D (VITAMIN A & D)  ointment Apply 1 application topically in the morning, at noon, and at bedtime. Apply to left and right heel for prevention each shift  ? witch hazel-glycerin (TUCKS) pad Apply 1 application topically 3 (three) times daily as needed for itching. Special Instructions: Apply Tucks pad to hemorrhoids  ? ?No facility-administered encounter medications on file as of 07/15/2021.  ? ? ? ?SIGNIFICANT DIAGNOSTIC EXAMS ? ?NO NEW EXAMS.  ? ? ?LABS REVIEWED PREVIOUS ? ?07-16-20: wbc 4.3; hgb 11.4; hct 36.9; mcv 105.7 plt 234; glucose 84; bun 63; creat 1.20; k+ 4.6; na++ 140; ca 9.3 GFR 41; ast 76; alt 113; alk phos 598; albumin 3.1 tsh 1.874 ? ?NO NEW LABS.  ? ?Review of Systems  ?Unable to perform ROS: Dementia  ? ?Physical Exam ?Constitutional:   ?   General: She is not in acute distress. ?   Appearance: She is well-developed. She is not diaphoretic.   ?Neck:  ?   Thyroid: No thyromegaly.  ?Cardiovascular:  ?   Rate and Rhythm: Normal rate and regular rhythm.  ?   Pulses: Normal pulses.  ?   Heart sounds: Normal heart sounds.  ?Pulmonary:  ?   Effort: Pulmonary effort is normal. No respiratory distress.  ?   Breath sounds: Normal breath sounds.  ?Abdominal:  ?   General: Bowel sounds are normal. There is no distension.  ?   Palpations: Abdomen is soft.  ?   Tenderness: There is no abdominal tenderness.  ?Musculoskeletal:     ?   General: Normal range of motion.  ?   Cervical back: Neck supple.  ?   Right lower leg: No edema.  ?   Left lower leg: No edema.  ?Lymphadenopathy:  ?   Cervical: No cervical adenopathy.  ?Skin: ?   General: Skin is warm and dry.  ?Neurological:  ?   Mental Status: She is alert. Mental status is at baseline.  ?Psychiatric:     ?   Mood and Affect: Mood normal.  ? ? ? ?ASSESSMENT/ PLAN: ? ?TODAY ? ?Pemphigoid:  ? ?Will begin prednisone 20 mg daily for 21 days then every other day for 10 days ?Will begin doxycycline 100 mg twice daily for 6 weeks ?Will monitor her status.  ? ? ? ? ?Ok Edwards NP ?[facility]  ?call [phone]  ? ?

## 2021-07-22 DIAGNOSIS — L129 Pemphigoid, unspecified: Secondary | ICD-10-CM | POA: Insufficient documentation

## 2021-07-23 ENCOUNTER — Encounter: Payer: Self-pay | Admitting: Adult Health

## 2021-07-23 ENCOUNTER — Non-Acute Institutional Stay (SKILLED_NURSING_FACILITY): Payer: Medicare HMO | Admitting: Adult Health

## 2021-07-23 DIAGNOSIS — R627 Adult failure to thrive: Secondary | ICD-10-CM | POA: Diagnosis not present

## 2021-07-23 DIAGNOSIS — F01C0 Vascular dementia, severe, without behavioral disturbance, psychotic disturbance, mood disturbance, and anxiety: Secondary | ICD-10-CM | POA: Diagnosis not present

## 2021-07-23 DIAGNOSIS — N1832 Chronic kidney disease, stage 3b: Secondary | ICD-10-CM

## 2021-07-23 NOTE — Progress Notes (Signed)
?Location:  [place] ?Nursing Home Room Number: 145-W ?Place of Service:  SNF (31) ? ? ?CODE STATUS: DNR ? ?Allergies  ?Allergen Reactions  ? Cephalexin Other (See Comments)  ?  Pt. Doesn't remember.   ? Cephalexin Other (See Comments)  ?  Pt. Doesn't remember.   ? Penicillins Other (See Comments)  ?  Pt. Cant remember.   ? ? ?Chief Complaint  ?Patient presents with  ? Acute Visit  ?  Care plan meeting  ? ? ?HPI: ? ?We have come together for her care plan meeting. Family present.  BIMS 5/16 mood 0/30. She is nonambulatory with no falls. She is extensive assist to dependent with her adls care. She is frequently incontinent of bladder and bowel. Dietary; weight is 127 pounds is stable. On regular diet with good appetite feeds self. Therapy: none at this time.  She is being treated for pemphigoid on her hands which is improving.she is more agitated since being on prednisone; will increase buspar to twice daily  She will continue to be followed for her chronic illnesses including: Severe vascular dementia without behavioral disturbance; psychotic disturbance mood disturbance; anxiety Stage 3b chronic kidney disease Failure to thrive in adult ? ?Past Medical History:  ?Diagnosis Date  ? Hypokalemia 06/11/2019  ? Thyroid disease   ? ? ?Past Surgical History:  ?Procedure Laterality Date  ? ABDOMINAL HYSTERECTOMY    ? TONSILLECTOMY    ? TOTAL HIP ARTHROPLASTY    ? left  ? ? ?Social History  ? ?Socioeconomic History  ? Marital status: Widowed  ?  Spouse name: Not on file  ? Number of children: Not on file  ? Years of education: Not on file  ? Highest education level: Not on file  ?Occupational History  ? Occupation: retired   ?Tobacco Use  ? Smoking status: Never  ? Smokeless tobacco: Never  ?Vaping Use  ? Vaping Use: Never used  ?Substance and Sexual Activity  ? Alcohol use: No  ? Drug use: No  ? Sexual activity: Not Currently  ?Other Topics Concern  ? Not on file  ?Social History Narrative  ? Long term resident  of [facility] .  ? ?Social Determinants of Health  ? ?Financial Resource Strain: Not on file  ?Food Insecurity: Not on file  ?Transportation Needs: Not on file  ?Physical Activity: Not on file  ?Stress: Not on file  ?Social Connections: Not on file  ?Intimate Partner Violence: Not on file  ? ?Family History  ?Problem Relation Age of Onset  ? Heart attack Mother   ? Heart attack Father   ? Hypertension Father   ? ? ? ? ?VITAL SIGNS ?BP 124/68   Pulse 70   Temp 98 ?F (36.7 ?C)   Resp 20   Ht 5' 1"  (1.549 m)   Wt 126 lb 9.6 oz (57.4 kg)   SpO2 99%   BMI 23.92 kg/m?  ? ?Outpatient Encounter Medications as of 07/23/2021  ?Medication Sig  ? acetaminophen (TYLENOL) 650 MG CR tablet Take 650 mg by mouth every 8 (eight) hours as needed.  ? Balsam Engineer, materials [facility]) OINT Special Instructions: Apply to sacrum and bilateral buttocks qshift for prevention. ?Every Shift ?Day, Evening, Night  ? busPIRone (BUSPAR) 5 MG tablet Take 5 mg by mouth once.  ? DOXYCYCLINE HYCLATE PO Take 100 mg by mouth in the morning and at bedtime.  ? hydrocortisone cream 1 % Apply 1 application topically daily as needed.  ? levothyroxine (SYNTHROID) 100  MCG tablet Take 100 mcg by mouth daily.  ? loratadine (CLARITIN) 10 MG tablet Take 10 mg by mouth daily. Chronic Allergies  ? mirabegron ER (MYRBETRIQ) 25 MG TB24 tablet Take 25 mg by mouth daily.  ? NON FORMULARY Regular diet  ? oxyCODONE (OXY IR/ROXICODONE) 5 MG immediate release tablet Take 1 tablet (5 mg total) by mouth every 8 (eight) hours.  ? Pramox-PE-Glycerin-Petrolatum (HEMORRHOIDAL EX) Apply topically. 0.25-1 %; rectal ?Special Instructions: Apply rectally daily PRN hemorrhoids  ? predniSONE (DELTASONE) 20 MG tablet Take 20 mg by mouth daily with breakfast. bullae pemphigoid  ? senna-docusate (SENOKOT-S) 8.6-50 MG tablet Take 1 tablet by mouth 2 (two) times daily.  ? Vitamins A & D (VITAMIN A & D) ointment Apply 1 application topically in the morning, at noon, and at bedtime. Apply to  left and right heel for prevention each shift  ? witch hazel-glycerin (TUCKS) pad Apply 1 application topically 3 (three) times daily as needed for itching. Special Instructions: Apply Tucks pad to hemorrhoids  ? ?No facility-administered encounter medications on file as of 07/23/2021.  ? ? ? ?SIGNIFICANT DIAGNOSTIC EXAMS ? ?NO NEW EXAMS.  ? ? ?LABS REVIEWED PREVIOUS ? ?07-16-20: wbc 4.3; hgb 11.4; hct 36.9; mcv 105.7 plt 234; glucose 84; bun 63; creat 1.20; k+ 4.6; na++ 140; ca 9.3 GFR 41; ast 76; alt 113; alk phos 598; albumin 3.1 tsh 1.874 ? ?NO NEW LABS.  ? ?Review of Systems  ?Unable to perform ROS: Dementia  ? ?Physical Exam ?Constitutional:   ?   General: She is not in acute distress. ?   Appearance: She is well-developed. She is not diaphoretic.  ?Neck:  ?   Thyroid: No thyromegaly.  ?Cardiovascular:  ?   Rate and Rhythm: Normal rate and regular rhythm.  ?   Pulses: Normal pulses.  ?   Heart sounds: Normal heart sounds.  ?Pulmonary:  ?   Effort: Pulmonary effort is normal. No respiratory distress.  ?   Breath sounds: Normal breath sounds.  ?Abdominal:  ?   General: Bowel sounds are normal. There is no distension.  ?   Palpations: Abdomen is soft.  ?   Tenderness: There is no abdominal tenderness.  ?Musculoskeletal:     ?   General: Normal range of motion.  ?   Cervical back: Neck supple.  ?   Right lower leg: No edema.  ?   Left lower leg: No edema.  ?Lymphadenopathy:  ?   Cervical: No cervical adenopathy.  ?Skin: ?   General: Skin is warm and dry.  ?   Comments: Her pemphigoid rash is improving on both hands without any swelling present.   ?Neurological:  ?   Mental Status: She is alert. Mental status is at baseline.  ?Psychiatric:     ?   Mood and Affect: Mood normal.  ? ? ? ?ASSESSMENT/ PLAN: ? ?TODAY ? ?Severe vascular dementia without behavioral disturbance; psychotic disturbance mood disturbance; anxiety  ?Stage 3b chronic kidney disease ?Failure to thrive in adult ? ?Will begin buspar 5 mg twice daily  for anxiety ?Will continue current plan of care ?Will continue to monitor her status.  ? ?Time spent with patient: 40 minutes: care plan medications; goals of care.  ? ?Ok Edwards NP ?[facility]  ? call [phone]  ? ?

## 2021-07-27 ENCOUNTER — Other Ambulatory Visit: Payer: Self-pay | Admitting: Adult Health

## 2021-07-27 MED ORDER — OXYCODONE HCL 5 MG PO TABS: 5.0000 mg | ORAL_TABLET | Freq: Three times a day (TID) | ORAL | 0 refills | Status: DC

## 2021-07-29 ENCOUNTER — Encounter: Payer: Self-pay | Admitting: Adult Health

## 2021-07-29 ENCOUNTER — Non-Acute Institutional Stay (SKILLED_NURSING_FACILITY): Payer: Medicare HMO | Admitting: Adult Health

## 2021-07-29 DIAGNOSIS — R52 Pain, unspecified: Secondary | ICD-10-CM

## 2021-07-29 DIAGNOSIS — R627 Adult failure to thrive: Secondary | ICD-10-CM

## 2021-07-29 DIAGNOSIS — D539 Nutritional anemia, unspecified: Secondary | ICD-10-CM

## 2021-07-29 DIAGNOSIS — K5909 Other constipation: Secondary | ICD-10-CM | POA: Diagnosis not present

## 2021-07-29 DIAGNOSIS — N3281 Overactive bladder: Secondary | ICD-10-CM

## 2021-07-29 DIAGNOSIS — N1832 Chronic kidney disease, stage 3b: Secondary | ICD-10-CM | POA: Diagnosis not present

## 2021-07-29 DIAGNOSIS — F01C0 Vascular dementia, severe, without behavioral disturbance, psychotic disturbance, mood disturbance, and anxiety: Secondary | ICD-10-CM

## 2021-07-29 DIAGNOSIS — E039 Hypothyroidism, unspecified: Secondary | ICD-10-CM

## 2021-07-29 DIAGNOSIS — L129 Pemphigoid, unspecified: Secondary | ICD-10-CM

## 2021-07-29 DIAGNOSIS — R634 Abnormal weight loss: Secondary | ICD-10-CM

## 2021-07-29 DIAGNOSIS — G8929 Other chronic pain: Secondary | ICD-10-CM

## 2021-07-29 NOTE — Progress Notes (Signed)
? ? ?Provider:Desiree Fleming  ?Location  [facility]  ? ?PCP: Gerlene Fee, NP ? ?Extended Emergency Contact Information ?Primary Emergency Contact: Schomburg,[name] ?Address: [address] ?         [place], [place] [address] [place] ?Home Phone: [phone] ?Mobile Phone: [phone] ?Relation: Son ?Secondary Emergency Contact: Wildasin,[name] ?Address: [address] ?         [place], [place] [address] [place] ?Home Phone: [phone] ?Mobile Phone: [phone] ?Relation: Other ? ?Codes status: dnr  ?Goals of care: advanced directive information ? ?  07/29/2021  ? 10:32 AM  ?Advanced Directives  ?Does Patient Have a Medical Advance Directive? Yes  ?Type of Paramedic of [place];Living will;Out of facility DNR (pink MOST or yellow form)  ?Does patient want to make changes to medical advance directive? No - Patient declined  ?Copy of [place] in Chart? Yes - validated most recent copy scanned in chart (See row information)  ?Pre-existing out of facility DNR order (yellow form or pink MOST form) Yellow form placed in chart (order not valid for inpatient use)  ? ? ? ?Allergies  ?Allergen Reactions  ? Cephalexin Other (See Comments)  ?  Pt. Doesn't remember.   ? Cephalexin Other (See Comments)  ?  Pt. Doesn't remember.   ? Penicillins Other (See Comments)  ?  Pt. Cant remember.   ? ? ?Chief Complaint  ?Patient presents with  ? Annual Exam  ? ? ?HPI ? ?She is a 88 year old long term resident of this facility being seen for her annual exam.  She continues to be followed by hospice care this past year. She has not had any hospitalizations; and has not been in the ED over this past year. She is presently being treated for pemphigoid on her hands and back. She is slowly losing weight to currently 126 pounds. . There have been no falls over the past year. She continues to get out of bed daily. She continues to be followed for her chronic illnesses  including: Severe vascular dementia without behavioral disturbance; psychotic disturbance; and mood disturbance:  Chronic generalized pain:  Elevated liver enzymes:  Failure to thrive in adult: ? ? ?Past Medical History:  ?Diagnosis Date  ? Hypokalemia 06/11/2019  ? Thyroid disease   ? ?Past Surgical History:  ?Procedure Laterality Date  ? ABDOMINAL HYSTERECTOMY    ? TONSILLECTOMY    ? TOTAL HIP ARTHROPLASTY    ? left  ? ? reports that she has never smoked. She has never used smokeless tobacco. She reports that she does not drink alcohol and does not use drugs. ?Social History  ? ?Tobacco Use  ? Smoking status: Never  ? Smokeless tobacco: Never  ?Vaping Use  ? Vaping Use: Never used  ?Substance Use Topics  ? Alcohol use: No  ? Drug use: No  ? ?Family History  ?Problem Relation Age of Onset  ? Heart attack Mother   ? Heart attack Father   ? Hypertension Father   ? ? ?Pertinent  Health Maintenance Due  ?Topic Date Due  ? INFLUENZA VACCINE  11/09/2021  ? DEXA SCAN  Discontinued  ? ? ?  07/05/2019  ?  8:00 AM 11/29/2019  ? 11:00 AM 01/19/2021  ? 11:04 AM 04/28/2021  ?  2:24 PM 06/03/2021  ? 11:23 AM  ?Fall Risk  ?Falls in the past year?  1 0 0 0  ?Was there an injury with Fall?  0 0 0 0  ?Fall Risk Category Calculator  1 0 0 0  ?Fall Risk Category  Low Low Low Low  ?Patient Fall Risk Level High fall risk Low fall risk Low fall risk Low fall risk   ?Patient at Risk for Falls Due to  Impaired balance/gait;Impaired mobility   History of fall(s);Impaired balance/gait;Impaired mobility  ? ? ?  11/29/2019  ? 11:00 AM  ?Depression screen PHQ 2/9  ?Decreased Interest 0  ?Down, Depressed, Hopeless 0  ?PHQ - 2 Score 0  ? ? ?Functional Status Survey: ?  ? ?Outpatient Encounter Medications as of 07/29/2021  ?Medication Sig  ? acetaminophen (TYLENOL) 650 MG CR tablet Take 650 mg by mouth every 8 (eight) hours as needed.  ? Balsam Engineer, materials [facility]) OINT Special Instructions: Apply to sacrum and bilateral buttocks qshift for  prevention. ?Every Shift ?Day, Evening, Night  ? busPIRone (BUSPAR) 5 MG tablet Take 5 mg by mouth once.  ? DOXYCYCLINE HYCLATE PO Take 100 mg by mouth in the morning and at bedtime.  ? hydrocortisone cream 1 % Apply 1 application topically daily as needed.  ? levothyroxine (SYNTHROID) 100 MCG tablet Take 100 mcg by mouth daily.  ? loratadine (CLARITIN) 10 MG tablet Take 10 mg by mouth daily. Chronic Allergies  ? mirabegron ER (MYRBETRIQ) 25 MG TB24 tablet Take 25 mg by mouth daily.  ? NON FORMULARY Regular diet  ? oxyCODONE (OXY IR/ROXICODONE) 5 MG immediate release tablet Take 1 tablet (5 mg total) by mouth every 8 (eight) hours.  ? Pramox-PE-Glycerin-Petrolatum (HEMORRHOIDAL EX) Apply topically. 0.25-1 %; rectal ?Special Instructions: Apply rectally daily PRN hemorrhoids  ? predniSONE (DELTASONE) 20 MG tablet Take 20 mg by mouth daily with breakfast. bullae pemphigoid  ? senna-docusate (SENOKOT-S) 8.6-50 MG tablet Take 1 tablet by mouth 2 (two) times daily.  ? Vitamins A & D (VITAMIN A & D) ointment Apply 1 application topically in the morning, at noon, and at bedtime. Apply to left and right heel for prevention each shift  ? witch hazel-glycerin (TUCKS) pad Apply 1 application topically 3 (three) times daily as needed for itching. Special Instructions: Apply Tucks pad to hemorrhoids  ? ?No facility-administered encounter medications on file as of 07/29/2021.  ? ? ? ?Vitals:  ? 07/29/21 1025  ?BP: (!) 149/77  ?Pulse: (!) 54  ?Resp: 20  ?Temp: 98 ?F (36.7 ?C)  ?SpO2: 99%  ?Weight: 126 lb 9.6 oz (57.4 kg)  ?Height: _0  (1.549 m)  ? ?Body mass index is 23.92 kg/m?. ? ?DIAGNOSTIC EXAMS ?NO NEW EXAMS.  ? ? ?LABS REVIEWED PREVIOUS ? ?10-31-19: wbc 7.6; hg 11.8; hct 38.5; mcv 104.6 plt 311; glucose 156; bun 68; creat 1.31; k+ 4.0; na++ 139; ca 9.2 ?07-16-20: wbc 4.3; hgb 11.4; hct 36.9; mcv 105.7 plt 234; glucose 84; bun 63; creat 1.20; k+ 4.6; na++ 140; ca 9.3 GFR 41; ast 76; alt 113; alk phos 598; albumin 3.1 tsh  1.874 ? ?NO NEW LABS.  ? ?Review of Systems  ?Unable to perform ROS: Dementia  ? ?Physical Exam ?Constitutional:   ?   General: She is not in acute distress. ?   Appearance: She is well-developed. She is not diaphoretic.  ?HENT:  ?   Mouth/Throat:  ?   Mouth: Mucous membranes are moist.  ?   Pharynx: Oropharynx is clear.  ?Eyes:  ?   Conjunctiva/sclera: Conjunctivae normal.  ?Neck:  ?   Thyroid: No thyromegaly.  ?Cardiovascular:  ?   Rate and  Rhythm: Normal rate and regular rhythm.  ?   Pulses: Normal pulses.  ?   Heart sounds: Normal heart sounds.  ?Pulmonary:  ?   Effort: Pulmonary effort is normal. No respiratory distress.  ?   Breath sounds: Normal breath sounds.  ?Abdominal:  ?   General: Bowel sounds are normal. There is no distension.  ?   Palpations: Abdomen is soft.  ?   Tenderness: There is no abdominal tenderness.  ?Musculoskeletal:     ?   General: Normal range of motion.  ?   Cervical back: Neck supple.  ?   Right lower leg: No edema.  ?   Left lower leg: No edema.  ?Lymphadenopathy:  ?   Cervical: No cervical adenopathy.  ?Skin: ?   General: Skin is warm and dry.  ?Neurological:  ?   Mental Status: She is alert. Mental status is at baseline.  ?Psychiatric:     ?   Mood and Affect: Mood normal.  ? ? ?  ?ASSESSMENT/ PLAN: ? ?TODAY ? ?Severe vascular dementia without behavioral disturbance; psychotic disturbance; and mood disturbance:  : continues to lose weight; current weight is 126 pounds. She is on buspar 5 mg daily to manage her anxiety  ? ?2. Chronic generalized pain: there are no indications of pain present: will continue oxycodone 5 mg every 8 hours ? ?3. Elevated liver enzymes: is chronic: ast 76; alt 113; alk phos 598; will monitor  ? ?4. Failure to thrive in adult: weight is 126 pounds; albumin 3.1 will continue supplements; unable to tolerate remeron.  ? ?5. Acquired hypothyroidism: tsh 1.874 will continue synthroid 100 mcg daily  ? ?6. Urinary incontinence: will continue myrbetriq 25 mg  daily ? ?7. Chronic constipation: will continue colace twice daily  ? ?8. CKD stage 3b: bun 68; creat 1.37 gfr 41 ? ?9. Pemphigoid: will continue doxycycline and prednisone will monitor  ? ? ? ?Ok Edwards NP ?[facility]

## 2021-08-05 ENCOUNTER — Other Ambulatory Visit: Payer: Self-pay | Admitting: Adult Health

## 2021-08-05 MED ORDER — OXYCODONE HCL 5 MG PO TABS: 5.0000 mg | ORAL_TABLET | Freq: Three times a day (TID) | ORAL | 0 refills | Status: DC

## 2021-08-11 ENCOUNTER — Other Ambulatory Visit: Payer: Self-pay | Admitting: Adult Health

## 2021-08-23 ENCOUNTER — Encounter: Payer: Self-pay | Admitting: Adult Health

## 2021-08-23 ENCOUNTER — Non-Acute Institutional Stay (SKILLED_NURSING_FACILITY): Payer: Medicare HMO | Admitting: Adult Health

## 2021-08-23 DIAGNOSIS — G8929 Other chronic pain: Secondary | ICD-10-CM

## 2021-08-23 DIAGNOSIS — R748 Abnormal levels of other serum enzymes: Secondary | ICD-10-CM | POA: Diagnosis not present

## 2021-08-23 DIAGNOSIS — R52 Pain, unspecified: Secondary | ICD-10-CM

## 2021-08-23 DIAGNOSIS — R627 Adult failure to thrive: Secondary | ICD-10-CM | POA: Diagnosis not present

## 2021-08-23 DIAGNOSIS — F01C0 Vascular dementia, severe, without behavioral disturbance, psychotic disturbance, mood disturbance, and anxiety: Secondary | ICD-10-CM | POA: Diagnosis not present

## 2021-08-23 NOTE — Progress Notes (Signed)
?Location:  [facility] ?Nursing Home Room Number: 782U ?Place of Service:  SNF (31) ?Provider: Ok Edwards, NP ? ?CODE STATUS: DNR ? ?Allergies  ?Allergen Reactions  ? Cephalexin Other (See Comments)  ?  Pt. Doesn't remember.   ? Cephalexin Other (See Comments)  ?  Pt. Doesn't remember.   ? Penicillins Other (See Comments)  ?  Pt. Cant remember.   ? ? ?Chief Complaint  ?Patient presents with  ? Medical Management of Chronic Issues  ?              ?             Severe vascular dementia without behavioral disturbance; psychotic disturbance; and mood disturbance:  Chronic generalized pain: Elevated liver enzymes: Failure to thrive in adult:   ? ? ?HPI: ? ?She is a 88 year old long term resident of this facility being seen for the management of her chronic illnesses: Severe vascular dementia without behavioral disturbance; psychotic disturbance; and mood disturbance:  Chronic generalized pain: Elevated liver enzymes: Failure to thrive in adult. There are no reports of uncontrolled pain. No reports of anxiety. She is slowly losing weight. She continues to be followed by hospice care.  ? ?Past Medical History:  ?Diagnosis Date  ? Hypokalemia 06/11/2019  ? Thyroid disease   ? ? ?Past Surgical History:  ?Procedure Laterality Date  ? ABDOMINAL HYSTERECTOMY    ? TONSILLECTOMY    ? TOTAL HIP ARTHROPLASTY    ? left  ? ? ?Social History  ? ?Socioeconomic History  ? Marital status: Widowed  ?  Spouse name: Not on file  ? Number of children: Not on file  ? Years of education: Not on file  ? Highest education level: Not on file  ?Occupational History  ? Occupation: retired   ?Tobacco Use  ? Smoking status: Never  ? Smokeless tobacco: Never  ?Vaping Use  ? Vaping Use: Never used  ?Substance and Sexual Activity  ? Alcohol use: No  ? Drug use: No  ? Sexual activity: Not Currently  ?Other Topics Concern  ? Not on file  ?Social History Narrative  ? Long term resident of [facility] .  ? ?Social Determinants of Health  ? ?Financial  Resource Strain: Not on file  ?Food Insecurity: Not on file  ?Transportation Needs: Not on file  ?Physical Activity: Not on file  ?Stress: Not on file  ?Social Connections: Not on file  ?Intimate Partner Violence: Not on file  ? ?Family History  ?Problem Relation Age of Onset  ? Heart attack Mother   ? Heart attack Father   ? Hypertension Father   ? ? ? ? ?VITAL SIGNS ?BP 128/70   Pulse 67   Temp 98 ?F (36.7 ?C)   Resp 18   Ht 5' 1"  (1.549 m)   Wt 120 lb 3.2 oz (54.5 kg)   BMI 22.71 kg/m?  ? ?Outpatient Encounter Medications as of 08/23/2021  ?Medication Sig  ? acetaminophen (TYLENOL) 650 MG CR tablet Take 650 mg by mouth every 8 (eight) hours as needed.  ? Balsam Engineer, materials [facility]) OINT Special Instructions: Apply to sacrum and bilateral buttocks qshift for prevention. ?Every Shift ?Day, Evening, Night  ? busPIRone (BUSPAR) 5 MG tablet Take 5 mg by mouth once.  ? DOXYCYCLINE HYCLATE PO Take 100 mg by mouth in the morning and at bedtime.  ? hydrocortisone cream 1 % Apply 1 application topically daily as needed.  ? levothyroxine (SYNTHROID) 100 MCG tablet  Take 100 mcg by mouth daily.  ? loratadine (CLARITIN) 10 MG tablet Take 10 mg by mouth daily. Chronic Allergies  ? mirabegron ER (MYRBETRIQ) 25 MG TB24 tablet Take 25 mg by mouth daily.  ? NON FORMULARY Regular diet  ? oxyCODONE (OXY IR/ROXICODONE) 5 MG immediate release tablet Take 1 tablet (5 mg total) by mouth every 8 (eight) hours.  ? Pramox-PE-Glycerin-Petrolatum (HEMORRHOIDAL EX) Apply topically. 0.25-1 %; rectal ?Special Instructions: Apply rectally daily PRN hemorrhoids  ? senna-docusate (SENOKOT-S) 8.6-50 MG tablet Take 1 tablet by mouth 2 (two) times daily.  ? Vitamins A & D (VITAMIN A & D) ointment Apply 1 application topically in the morning, at noon, and at bedtime. Apply to left and right heel for prevention each shift  ? witch hazel-glycerin (TUCKS) pad Apply 1 application topically 3 (three) times daily as needed for itching. Special  Instructions: Apply Tucks pad to hemorrhoids  ? ?No facility-administered encounter medications on file as of 08/23/2021.  ? ? ? ?SIGNIFICANT DIAGNOSTIC EXAMS ? ?NO NEW EXAMS.  ? ? ?LABS REVIEWED PREVIOUS ? ?07-16-20: wbc 4.3; hgb 11.4; hct 36.9; mcv 105.7 plt 234; glucose 84; bun 63; creat 1.20; k+ 4.6; na++ 140; ca 9.3 GFR 41; ast 76; alt 113; alk phos 598; albumin 3.1 tsh 1.874 ? ?NO NEW LABS.  ? ?Review of Systems  ?Unable to perform ROS: Dementia  ? ?Physical Exam ?Constitutional:   ?   General: She is not in acute distress. ?   Appearance: She is well-developed. She is not diaphoretic.  ?Neck:  ?   Thyroid: No thyromegaly.  ?Cardiovascular:  ?   Rate and Rhythm: Normal rate and regular rhythm.  ?   Pulses: Normal pulses.  ?   Heart sounds: Normal heart sounds.  ?Pulmonary:  ?   Effort: Pulmonary effort is normal. No respiratory distress.  ?   Breath sounds: Normal breath sounds.  ?Abdominal:  ?   General: Bowel sounds are normal. There is no distension.  ?   Palpations: Abdomen is soft.  ?   Tenderness: There is no abdominal tenderness.  ?Musculoskeletal:     ?   General: Normal range of motion.  ?   Cervical back: Neck supple.  ?   Right lower leg: No edema.  ?   Left lower leg: No edema.  ?Lymphadenopathy:  ?   Cervical: No cervical adenopathy.  ?Skin: ?   General: Skin is warm and dry.  ?   Comments: Skin is healing   ?Neurological:  ?   Mental Status: She is alert. Mental status is at baseline.  ?Psychiatric:     ?   Mood and Affect: Mood normal.  ? ? ?  ?ASSESSMENT/ PLAN: ? ?TODAY ? ?Severe vascular dementia without behavioral disturbance; psychotic disturbance; and mood disturbance:  : continues to lose weight; current weight is 120 pounds. She is on buspar 5 mg daily to manage her anxiety  ? ?2. Chronic generalized pain: there are no indications of pain present: will continue oxycodone 5 mg every 8 hours ? ?3. Elevated liver enzymes: is chronic: ast 76; alt 113; alk phos 598; will monitor  ? ?4. Failure to  thrive in adult: weight is 120 pounds; albumin 3.1 will continue supplements; unable to tolerate remeron.  ? ?PREVIOUS  ? ?5. Acquired hypothyroidism: tsh 1.874 will continue synthroid 100 mcg daily  ? ?6. Urinary incontinence: will continue myrbetriq 25 mg daily ? ?7. Chronic constipation: will continue colace twice daily  ? ?8.  CKD stage 3b: bun 68; creat 1.37 gfr 41 ? ?9. Pemphigoid: will continue doxycycline has completed prednisone  ? ?Will stop vitamin D; will check cbc cmp tsh  ? ?Ok Edwards NP ?[facility]  ?call [phone]   ?

## 2021-08-24 ENCOUNTER — Encounter: Payer: Self-pay | Admitting: Adult Health

## 2021-08-26 ENCOUNTER — Other Ambulatory Visit (HOSPITAL_COMMUNITY)
Admission: RE | Admit: 2021-08-26 | Discharge: 2021-08-26 | Disposition: A | Discharge status: Home / Self Care | Source: Skilled Nursing Facility | Attending: Adult Health | Admitting: Adult Health

## 2021-08-26 ENCOUNTER — Non-Acute Institutional Stay (SKILLED_NURSING_FACILITY): Payer: Medicare HMO | Admitting: Adult Health

## 2021-08-26 ENCOUNTER — Encounter: Payer: Self-pay | Admitting: Adult Health

## 2021-08-26 DIAGNOSIS — F03C0 Unspecified dementia, severe, without behavioral disturbance, psychotic disturbance, mood disturbance, and anxiety: Secondary | ICD-10-CM | POA: Diagnosis present

## 2021-08-26 DIAGNOSIS — R748 Abnormal levels of other serum enzymes: Secondary | ICD-10-CM

## 2021-08-26 DIAGNOSIS — E43 Unspecified severe protein-calorie malnutrition: Secondary | ICD-10-CM | POA: Diagnosis not present

## 2021-08-26 LAB — TSH: TSH: 4.31 u[IU]/mL (ref 0.350–4.500)

## 2021-08-26 LAB — COMPREHENSIVE METABOLIC PANEL
ALT: 58 U/L — ABNORMAL HIGH (ref 0–44)
AST: 34 U/L (ref 15–41)
Albumin: 2.1 g/dL — ABNORMAL LOW (ref 3.5–5.0)
Alkaline Phosphatase: 316 U/L — ABNORMAL HIGH (ref 38–126)
Anion gap: 10 (ref 5–15)
BUN: 57 mg/dL — ABNORMAL HIGH (ref 8–23)
CO2: 22 mmol/L (ref 22–32)
Calcium: 8.2 mg/dL — ABNORMAL LOW (ref 8.9–10.3)
Chloride: 106 mmol/L (ref 98–111)
Creatinine, Ser: 1.64 mg/dL — ABNORMAL HIGH (ref 0.44–1.00)
GFR, Estimated: 28 mL/min — ABNORMAL LOW (ref >60)
Glucose, Bld: 75 mg/dL (ref 70–99)
Potassium: 3.8 mmol/L (ref 3.5–5.1)
Sodium: 138 mmol/L (ref 135–145)
Total Bilirubin: 0.6 mg/dL (ref 0.3–1.2)
Total Protein: 5.1 g/dL — ABNORMAL LOW (ref 6.5–8.1)

## 2021-08-26 LAB — CBC
HCT: 37.5 % (ref 36.0–46.0)
Hemoglobin: 12 g/dL (ref 12.0–15.0)
MCH: 29.9 pg (ref 26.0–34.0)
MCHC: 32 g/dL (ref 30.0–36.0)
MCV: 93.5 fL (ref 80.0–100.0)
Platelets: 206 10*3/uL (ref 150–400)
RBC: 4.01 MIL/uL (ref 3.87–5.11)
RDW: 15.2 % (ref 11.5–15.5)
WBC: 7.3 10*3/uL (ref 4.0–10.5)
nRBC: 3 % — ABNORMAL HIGH (ref 0.0–0.2)

## 2021-08-26 NOTE — Progress Notes (Signed)
Location:  [place] Room Number: 145-W Place of Service:  SNF (31)   CODE STATUS: DNR  Allergies  Allergen Reactions   Cephalexin Other (See Comments)    Pt. Doesn't remember.    Cephalexin Other (See Comments)    Pt. Doesn't remember.    Penicillins Other (See Comments)    Pt. Cant remember.     Chief Complaint  Patient presents with   Acute Visit    Follow-up labs    HPI:  Her albumin is 2.1 with a protein of 5.1. her liver enzymes remain elevated. She has been slowly losing weight. There are no reports of uncontrolled pain pain. No reports of anxiety present.   Past Medical History:  Diagnosis Date   Hypokalemia 06/11/2019   Thyroid disease     Past Surgical History:  Procedure Laterality Date   ABDOMINAL HYSTERECTOMY     TONSILLECTOMY     TOTAL HIP ARTHROPLASTY     left    Social History   Socioeconomic History   Marital status: Widowed    Spouse name: Not on file   Number of children: Not on file   Years of education: Not on file   Highest education level: Not on file  Occupational History   Occupation: retired   Tobacco Use   Smoking status: Never   Smokeless tobacco: Never  Vaping Use   Vaping Use: Never used  Substance and Sexual Activity   Alcohol use: No   Drug use: No   Sexual activity: Not Currently  Other Topics Concern   Not on file  Social History Narrative   Long term resident of [facility] .   Social Determinants of Health   Financial Resource Strain: Not on file  Food Insecurity: Not on file  Transportation Needs: Not on file  Physical Activity: Not on file  Stress: Not on file  Social Connections: Not on file  Intimate Partner Violence: Not on file   Family History  Problem Relation Age of Onset   Heart attack Mother    Heart attack Father    Hypertension Father       VITAL SIGNS BP 128/70   Pulse 67   Temp 98 F (36.7 C)   Resp 20   Ht _0  (1.549 m)   Wt 120 lb 3.2 oz (54.5 kg)   SpO2  99%   BMI 22.71 kg/m   Outpatient Encounter Medications as of 08/26/2021  Medication Sig   acetaminophen (TYLENOL) 650 MG CR tablet Take 650 mg by mouth every 8 (eight) hours as needed.   Balsam [place]-Castor Oil [facility]) OINT Special Instructions: Apply to sacrum and bilateral buttocks qshift for prevention. Every Shift Day, Evening, Night   busPIRone (BUSPAR) 5 MG tablet Take 5 mg by mouth once.   DOXYCYCLINE HYCLATE PO Take 100 mg by mouth in the morning and at bedtime.   hydrocortisone cream 1 % Apply 1 application topically daily as needed.   levothyroxine (SYNTHROID) 100 MCG tablet Take 100 mcg by mouth daily.   loratadine (CLARITIN) 10 MG tablet Take 10 mg by mouth daily. Chronic Allergies   mirabegron ER (MYRBETRIQ) 25 MG TB24 tablet Take 25 mg by mouth daily.   NON FORMULARY Regular diet   oxyCODONE (OXY IR/ROXICODONE) 5 MG immediate release tablet Take 1 tablet (5 mg total) by mouth every 8 (eight) hours.   Pramox-PE-Glycerin-Petrolatum (HEMORRHOIDAL EX) Apply topically. 0.25-1 %; rectal Special Instructions: Apply rectally daily PRN hemorrhoids   senna-docusate (  SENOKOT-S) 8.6-50 MG tablet Take 1 tablet by mouth 2 (two) times daily.   Vitamins A & D (VITAMIN A & D) ointment Apply 1 application topically in the morning, at noon, and at bedtime. Apply to left and right heel for prevention each shift   witch hazel-glycerin (TUCKS) pad Apply 1 application topically 3 (three) times daily as needed for itching. Special Instructions: Apply Tucks pad to hemorrhoids   No facility-administered encounter medications on file as of 08/26/2021.     SIGNIFICANT DIAGNOSTIC EXAMS  NO NEW EXAMS.    LABS REVIEWED  TODAY.   08-25-21: wbc 7.3; hgb 12.0; hct 37.5; mcv 93.5 plt 206; glucose 75; bun 57; creat 1.64; k+ 3.8; na++ 138; ca 8.2; gfr 28; protein 5.1; albumin 2.1; alt 58; alk phos 316   Review of Systems  Unable to perform ROS: Dementia    Physical Exam Constitutional:       General: She is not in acute distress.    Appearance: She is well-developed. She is not diaphoretic.  Neck:     Thyroid: No thyromegaly.  Cardiovascular:     Rate and Rhythm: Normal rate and regular rhythm.     Pulses: Normal pulses.     Heart sounds: Normal heart sounds.  Pulmonary:     Effort: Pulmonary effort is normal. No respiratory distress.     Breath sounds: Normal breath sounds.  Abdominal:     General: Bowel sounds are normal. There is no distension.     Palpations: Abdomen is soft.     Tenderness: There is no abdominal tenderness.  Musculoskeletal:        General: Normal range of motion.     Cervical back: Neck supple.     Right lower leg: No edema.     Left lower leg: No edema.  Lymphadenopathy:     Cervical: No cervical adenopathy.  Skin:    General: Skin is warm and dry.  Neurological:     Mental Status: She is alert. Mental status is at baseline.  Psychiatric:        Mood and Affect: Mood normal.     ASSESSMENT/ PLAN:  TODAY  Protein calorie malnutrition severe: protein 5.1 albumin 2.1: will begin ensure with meals  2. Elevated liver enzymes: is without change    Ok Edwards NP [facility] Adult Medicine  call [phone]

## 2021-08-30 ENCOUNTER — Other Ambulatory Visit: Payer: Self-pay | Admitting: Adult Health

## 2021-08-30 MED ORDER — OXYCODONE HCL 5 MG PO TABS: 5.0000 mg | ORAL_TABLET | Freq: Three times a day (TID) | ORAL | 0 refills | Status: DC

## 2021-09-17 ENCOUNTER — Encounter: Payer: Self-pay | Admitting: Internal Medicine

## 2021-09-17 ENCOUNTER — Non-Acute Institutional Stay (SKILLED_NURSING_FACILITY): Payer: Medicare HMO | Admitting: Internal Medicine

## 2021-09-17 DIAGNOSIS — D539 Nutritional anemia, unspecified: Secondary | ICD-10-CM | POA: Diagnosis not present

## 2021-09-17 DIAGNOSIS — L03115 Cellulitis of right lower limb: Secondary | ICD-10-CM

## 2021-09-17 DIAGNOSIS — N183 Chronic kidney disease, stage 3 unspecified: Secondary | ICD-10-CM | POA: Diagnosis not present

## 2021-09-17 DIAGNOSIS — E039 Hypothyroidism, unspecified: Secondary | ICD-10-CM

## 2021-09-17 DIAGNOSIS — E43 Unspecified severe protein-calorie malnutrition: Secondary | ICD-10-CM | POA: Diagnosis not present

## 2021-09-17 NOTE — Patient Instructions (Signed)
See assessment and plan under each diagnosis in the problem list and acutely for this visit 

## 2021-09-17 NOTE — Assessment & Plan Note (Signed)
Total protein 5.1 and albumin of 2.1 compatible with severe protein/caloric malnutrition.  Nutritionist continues to follow at [facility].

## 2021-09-17 NOTE — Assessment & Plan Note (Signed)
TSH was therapeutic at 4.310 on high-dose L-thyroxine.  No change indicated.

## 2021-09-17 NOTE — Assessment & Plan Note (Addendum)
CKD is now stage IV with a creatinine of 1.64 and GFR of 28.  Dose of Myrbetriq should not be above 25 mg a day, the present dose.

## 2021-09-17 NOTE — Progress Notes (Unsigned)
   NURSING HOME LOCATION: [facility] ROOM NUMBER:  145 W  CODE STATUS:  DNR  PCP:  Ok Edwards NP,PSC  This is a nursing facility follow up visit of chronic medical diagnoses & to document compliance with Regulation 483.30 (c) in The [employer] Manual Phase 2 which mandates caregiver visit ( visits can alternate among physician, PA or NP as per statutes) within 10 days of 30 days / 60 days/ 90 days post admission to SNF date    Interim medical record and care since last SNF visit was updated with review of diagnostic studies and change in clinical status since last visit were documented.  HPI: She is a permanent resident of the facility with medical diagnoses of hypothyroidism, advanced dementia, overactive bladder, DJD, CKD, protein/caloric malnutrition, macrocytic anemia, history of transaminitis, and adult failure to thrive.  Labs are current and reveal a creatinine of 1.64 with a GFR of 28 indicating CKD stage IV.  AST is normal at 34 but ALT is elevated at 58.  Total protein is 5.1 and albumin 2.1 demonstrating severe protein/caloric malnutrition.  Anemia has resolved as H/H is 12/37.5 with normochromic, normocytic indices.  TSH is therapeutic at 4.310.  Review of systems: Dementia invalidated responses.  She could not give me the date.  She confabulated about my having come in last night and checked her legs. She stated that she was having soreness in the left foot "all night long".  She states she appreciated me checking it last night.  Physical exam:  Pertinent or positive findings: Initially she was asleep but could be easily aroused.  While asleep she did not exhibit hypopnea, apnea, or snoring.  She appears younger than her stated age.  Eyebrows are decreased and eyelashes are absent.  She has somewhat of a moon facies.  Heart rhythm and rate is irregular with intermittent increased first heart sound.  Pedal pulses are decreased.  She has 1/2+ edema at  the right sock line.  The right calf is edematous and reveals mild erythema medially.  There is 1+ edema at this location.  Subjectively the there may be some soreness to deep palpation but no frank Homans' sign.  There is a large bruise over the left shin distally.  There is no edema on the left lower extremity.  General appearance: no acute distress, increased work of breathing is present.   Lymphatic: No lymphadenopathy about the head, neck, axilla. Eyes: No conjunctival inflammation or lid edema is present. There is no scleral icterus. Ears:  External ear exam shows no significant lesions or deformities.   Nose:  External nasal examination shows no deformity or inflammation. Nasal mucosa are pink and moist without lesions, exudates Oral exam:  Lips and gums are healthy appearing. There is no oropharyngeal erythema or exudate. Neck:  No thyromegaly, masses, tenderness noted.    Heart:  No gallop, murmur, click, rub .  Lungs: Chest clear to auscultation without wheezes, rhonchi, rales, rubs. Abdomen: Bowel sounds are normal. Abdomen is soft and nontender with no organomegaly, hernias, masses. GU: Deferred  Extremities:  No cyanosis, clubbing  Neurologic exam :Balance, Rhomberg, finger to nose testing could not be completed due to clinical state Skin: Warm & dry w/o tenting. No significant lesions or rash.  See summary under each active problem in the Problem List with associated updated therapeutic plan

## 2021-09-17 NOTE — Assessment & Plan Note (Signed)
CBC and differential was normal on 08/26/2021.  Indices are normal; macrocytic anemia resolved.

## 2021-09-28 ENCOUNTER — Other Ambulatory Visit: Payer: Self-pay | Admitting: Adult Health

## 2021-09-28 MED ORDER — OXYCODONE HCL 5 MG PO TABS: 5.0000 mg | ORAL_TABLET | Freq: Three times a day (TID) | ORAL | 0 refills | Status: DC

## 2021-10-07 ENCOUNTER — Encounter: Payer: Self-pay | Admitting: Adult Health

## 2021-10-07 ENCOUNTER — Non-Acute Institutional Stay (SKILLED_NURSING_FACILITY): Payer: Medicare HMO | Admitting: Adult Health

## 2021-10-07 DIAGNOSIS — E43 Unspecified severe protein-calorie malnutrition: Secondary | ICD-10-CM

## 2021-10-07 DIAGNOSIS — F01C0 Vascular dementia, severe, without behavioral disturbance, psychotic disturbance, mood disturbance, and anxiety: Secondary | ICD-10-CM | POA: Diagnosis not present

## 2021-10-07 DIAGNOSIS — N1832 Chronic kidney disease, stage 3b: Secondary | ICD-10-CM | POA: Diagnosis not present

## 2021-10-07 NOTE — Progress Notes (Signed)
Location:  [place] Room Number: 145/W Place of Service:  SNF (31)   CODE STATUS: DNR  Allergies  Allergen Reactions   Cephalexin Other (See Comments)    Pt. Doesn't remember.    Cephalexin Other (See Comments)    Pt. Doesn't remember.    Penicillins Other (See Comments)    Pt. Cant remember.     Chief Complaint  Patient presents with   Acute Visit    Care plan meeting    HPI:  We have come together for her care plan meeting. Family present. BIMS 5/15 ood 0/30. She is nonambulatory with no falls. She requires extensive assist iwht adls. She is frequently incontinent of bladder and bowel. Dietary: weight is 121 pounds; regular diet 25-75% intake; feeds self. Therapy: none at this time. She continues to be followed by hospice care. She continues to be followed for her chronic illnesses including: Severe vascular dementia without behavioral disturbance without psychotic disturbance; mood disturbance or anxiety   Stage 3b chronic kidney disease  Protein calorie malnutrition severe  Past Medical History:  Diagnosis Date   Hypokalemia 06/11/2019   Thyroid disease     Past Surgical History:  Procedure Laterality Date   ABDOMINAL HYSTERECTOMY     TONSILLECTOMY     TOTAL HIP ARTHROPLASTY     left    Social History   Socioeconomic History   Marital status: Widowed    Spouse name: Not on file   Number of children: Not on file   Years of education: Not on file   Highest education level: Not on file  Occupational History   Occupation: retired   Tobacco Use   Smoking status: Never   Smokeless tobacco: Never  Vaping Use   Vaping Use: Never used  Substance and Sexual Activity   Alcohol use: No   Drug use: No   Sexual activity: Not Currently  Other Topics Concern   Not on file  Social History Narrative   Long term resident of [facility] .   Social Determinants of Health   Financial Resource Strain: Not on file  Food Insecurity: Not on file   Transportation Needs: Not on file  Physical Activity: Not on file  Stress: Not on file  Social Connections: Not on file  Intimate Partner Violence: Not on file   Family History  Problem Relation Age of Onset   Heart attack Mother    Heart attack Father    Hypertension Father       VITAL SIGNS BP (!) 110/53   Pulse 77   Temp 99.1 F (37.3 C)   Resp 20   Ht 5' 1"  (1.549 m)   Wt 121 lb (54.9 kg)   SpO2 97%   BMI 22.86 kg/m   Outpatient Encounter Medications as of 10/07/2021  Medication Sig   acetaminophen (TYLENOL) 650 MG CR tablet Take 650 mg by mouth every 8 (eight) hours as needed.   Balsam [place]-Castor Oil [facility]) OINT Special Instructions: Apply to sacrum and bilateral buttocks qshift for prevention. Every Shift Day, Evening, Night   busPIRone (BUSPAR) 5 MG tablet Take 5 mg by mouth once.   hydrocortisone cream 1 % Apply 1 application topically daily as needed.   levothyroxine (SYNTHROID) 100 MCG tablet Take 100 mcg by mouth daily.   loratadine (CLARITIN) 10 MG tablet Take 10 mg by mouth daily. Chronic Allergies   mirabegron ER (MYRBETRIQ) 25 MG TB24 tablet Take 25 mg by mouth daily.   NON FORMULARY  Regular diet   oxyCODONE (OXY IR/ROXICODONE) 5 MG immediate release tablet Take 1 tablet (5 mg total) by mouth every 8 (eight) hours.   Pramox-PE-Glycerin-Petrolatum (HEMORRHOIDAL EX) Apply topically. 0.25-1 %; rectal Special Instructions: Apply rectally daily PRN hemorrhoids   senna-docusate (SENOKOT-S) 8.6-50 MG tablet Take 1 tablet by mouth 2 (two) times daily.   Vitamins A & D (VITAMIN A & D) ointment Apply 1 application topically in the morning, at noon, and at bedtime. Apply to left and right heel for prevention each shift   witch hazel-glycerin (TUCKS) pad Apply 1 application topically 3 (three) times daily as needed for itching. Special Instructions: Apply Tucks pad to hemorrhoids   No facility-administered encounter medications on file as of 10/07/2021.      SIGNIFICANT DIAGNOSTIC EXAMS   NO NEW EXAMS.    LABS REVIEWED  PREVIOUS    08-25-21: wbc 7.3; hgb 12.0; hct 37.5; mcv 93.5 plt 206; glucose 75; bun 57; creat 1.64; k+ 3.8; na++ 138; ca 8.2; gfr 28; protein 5.1; albumin 2.1; alt 58; alk phos 316   NO NEW LABS.   Review of Systems  Unable to perform ROS: Dementia   Physical Exam Constitutional:      General: She is not in acute distress.    Appearance: She is well-developed. She is not diaphoretic.  Neck:     Thyroid: No thyromegaly.  Cardiovascular:     Rate and Rhythm: Normal rate and regular rhythm.     Pulses: Normal pulses.     Heart sounds: Normal heart sounds.  Pulmonary:     Effort: Pulmonary effort is normal. No respiratory distress.     Breath sounds: Normal breath sounds.  Abdominal:     General: Bowel sounds are normal. There is no distension.     Palpations: Abdomen is soft.     Tenderness: There is no abdominal tenderness.  Musculoskeletal:        General: Normal range of motion.     Cervical back: Neck supple.     Right lower leg: No edema.     Left lower leg: No edema.  Lymphadenopathy:     Cervical: No cervical adenopathy.  Skin:    General: Skin is warm and dry.  Neurological:     Mental Status: She is alert. Mental status is at baseline.  Psychiatric:        Mood and Affect: Mood normal.       ASSESSMENT/ PLAN:  TODAY  Severe vascular dementia without behavioral disturbance without psychotic disturbance; mood disturbance or anxiety  Stage 3b chronic kidney disease Protein calorie malnutrition severe  Will continue current medications Will continue current plan of care Will continue to monitor her status.   Time spent with patient: 40 minutes: hospice care medications; dietary.    Ok Edwards NP [facility] Adult Medicine  call [phone]

## 2021-10-25 ENCOUNTER — Non-Acute Institutional Stay (SKILLED_NURSING_FACILITY): Payer: Medicare HMO | Admitting: Adult Health

## 2021-10-25 ENCOUNTER — Encounter: Payer: Self-pay | Admitting: Adult Health

## 2021-10-25 DIAGNOSIS — N1832 Chronic kidney disease, stage 3b: Secondary | ICD-10-CM | POA: Diagnosis not present

## 2021-10-25 DIAGNOSIS — N3281 Overactive bladder: Secondary | ICD-10-CM

## 2021-10-25 DIAGNOSIS — K5909 Other constipation: Secondary | ICD-10-CM | POA: Diagnosis not present

## 2021-10-25 DIAGNOSIS — E039 Hypothyroidism, unspecified: Secondary | ICD-10-CM | POA: Diagnosis not present

## 2021-10-25 NOTE — Progress Notes (Unsigned)
Location:  [facility] Room Number: 145-W Place of Service:  SNF (31)   CODE STATUS: DNR  Allergies  Allergen Reactions   Cephalexin Other (See Comments)    Pt. Doesn't remember.    Cephalexin Other (See Comments)    Pt. Doesn't remember.    Penicillins Other (See Comments)    Pt. Cant remember.     Chief Complaint  Patient presents with   Medical Management of Chronic Issues    Routine Visit.    HPI:    Past Medical History:  Diagnosis Date   Hypokalemia 06/11/2019   Thyroid disease     Past Surgical History:  Procedure Laterality Date   ABDOMINAL HYSTERECTOMY     TONSILLECTOMY     TOTAL HIP ARTHROPLASTY     left    Social History   Socioeconomic History   Marital status: Widowed    Spouse name: Not on file   Number of children: Not on file   Years of education: Not on file   Highest education level: Not on file  Occupational History   Occupation: retired   Tobacco Use   Smoking status: Never   Smokeless tobacco: Never  Vaping Use   Vaping Use: Never used  Substance and Sexual Activity   Alcohol use: No   Drug use: No   Sexual activity: Not Currently  Other Topics Concern   Not on file  Social History Narrative   Long term resident of [facility] .   Social Determinants of Health   Financial Resource Strain: Not on file  Food Insecurity: Not on file  Transportation Needs: Not on file  Physical Activity: Not on file  Stress: Not on file  Social Connections: Not on file  Intimate Partner Violence: Not on file   Family History  Problem Relation Age of Onset   Heart attack Mother    Heart attack Father    Hypertension Father       VITAL SIGNS BP 126/72   Pulse 91   Temp (!) 97.3 F (36.3 C)   Resp 20   Ht 5\' 1"  (1.549 m)   Wt 120 lb 3.2 oz (54.5 kg)   SpO2 98%   BMI 22.71 kg/m   Outpatient Encounter Medications as of 10/25/2021  Medication Sig   acetaminophen (TYLENOL) 650 MG CR tablet Take 650 mg by mouth  every 8 (eight) hours as needed.   Balsam [place]-Castor Oil [facility]) OINT Special Instructions: Apply to sacrum and bilateral buttocks qshift for prevention. Every Shift Day, Evening, Night   busPIRone (BUSPAR) 5 MG tablet Take 5 mg by mouth at bedtime.   hydrocortisone cream (PREPARATION H) 1 % Apply 1 Application topically daily as needed for itching.   levothyroxine (SYNTHROID) 100 MCG tablet Take 100 mcg by mouth daily.   loratadine (CLARITIN) 10 MG tablet Take 10 mg by mouth daily. Chronic Allergies   mirabegron ER (MYRBETRIQ) 25 MG TB24 tablet Take 25 mg by mouth daily.   NON FORMULARY Regular diet   oxyCODONE (OXY IR/ROXICODONE) 5 MG immediate release tablet Take 1 tablet (5 mg total) by mouth every 8 (eight) hours.   Pramox-PE-Glycerin-Petrolatum (HEMORRHOIDAL EX) Apply topically. 0.25-1 %; rectal Special Instructions: Apply rectally daily PRN hemorrhoids   senna-docusate (SENOKOT-S) 8.6-50 MG tablet Take 1 tablet by mouth 2 (two) times daily.   witch hazel-glycerin (TUCKS) pad Apply 1 application topically 3 (three) times daily as needed for itching. Special Instructions: Apply Tucks pad to hemorrhoids   [  DISCONTINUED] hydrocortisone cream 1 % Apply 1 application topically daily as needed.   [DISCONTINUED] Vitamins A & D (VITAMIN A & D) ointment Apply 1 application topically in the morning, at noon, and at bedtime. Apply to left and right heel for prevention each shift   No facility-administered encounter medications on file as of 10/25/2021.     SIGNIFICANT DIAGNOSTIC EXAMS       ASSESSMENT/ PLAN:     Synthia Innocent NP [facility] Adult Medicine  Contact [phone] Monday through Friday 8am- 5pm  After hours call [phone]

## 2021-10-28 ENCOUNTER — Other Ambulatory Visit: Payer: Self-pay | Admitting: Adult Health

## 2021-10-28 MED ORDER — OXYCODONE HCL 5 MG PO TABS: 5.0000 mg | ORAL_TABLET | Freq: Three times a day (TID) | ORAL | 0 refills | Status: DC

## 2021-11-24 ENCOUNTER — Encounter: Payer: Self-pay | Admitting: Adult Health

## 2021-11-24 ENCOUNTER — Non-Acute Institutional Stay (SKILLED_NURSING_FACILITY): Payer: Medicare HMO | Admitting: Adult Health

## 2021-11-24 DIAGNOSIS — N3281 Overactive bladder: Secondary | ICD-10-CM

## 2021-11-24 DIAGNOSIS — F01C0 Vascular dementia, severe, without behavioral disturbance, psychotic disturbance, mood disturbance, and anxiety: Secondary | ICD-10-CM

## 2021-11-24 DIAGNOSIS — R52 Pain, unspecified: Secondary | ICD-10-CM | POA: Diagnosis not present

## 2021-11-24 DIAGNOSIS — L129 Pemphigoid, unspecified: Secondary | ICD-10-CM

## 2021-11-24 DIAGNOSIS — G8929 Other chronic pain: Secondary | ICD-10-CM

## 2021-11-24 NOTE — Progress Notes (Signed)
Location:  [place] Room Number: NO/145/W Place of Service:  SNF (31)  Odell Choung S.,NP  CODE STATUS: DNR  Allergies  Allergen Reactions   Cephalexin Other (See Comments)    Pt. Doesn't remember.    Cephalexin Other (See Comments)    Pt. Doesn't remember.    Penicillins Other (See Comments)    Pt. Cant remember.     Chief Complaint  Patient presents with   Medical Management of Chronic Issues                           Urinary incontinence:  Pemphigoid  Severe vascular dementia without behavioral disturbance; psychotic disturbance and mood disturbance: Chronic generalized pain:    HPI:  She is a 88 year old long term resident of this facility being seen for the management of her chronic illnesses:  Urinary incontinence:  Pemphigoid  Severe vascular dementia without behavioral disturbance; psychotic disturbance and mood disturbance: Chronic generalized pain. Her pain is managed; her pemphigoid rash is inflamed especially in her hands. She is coming out of her room. She is frequently incontinent of urine; it is doubtful that she is getting benefit from [facility]   Past Medical History:  Diagnosis Date   Hypokalemia 06/11/2019   Thyroid disease     Past Surgical History:  Procedure Laterality Date   ABDOMINAL HYSTERECTOMY     TONSILLECTOMY     TOTAL HIP ARTHROPLASTY     left    Social History   Socioeconomic History   Marital status: Widowed    Spouse name: Not on file   Number of children: Not on file   Years of education: Not on file   Highest education level: Not on file  Occupational History   Occupation: retired   Tobacco Use   Smoking status: Never   Smokeless tobacco: Never  Vaping Use   Vaping Use: Never used  Substance and Sexual Activity   Alcohol use: No   Drug use: No   Sexual activity: Not Currently  Other Topics Concern   Not on file  Social History Narrative   Long term resident of [facility] .   Social Determinants of  Health   Financial Resource Strain: Not on file  Food Insecurity: Not on file  Transportation Needs: Not on file  Physical Activity: Not on file  Stress: Not on file  Social Connections: Not on file  Intimate Partner Violence: Not on file   Family History  Problem Relation Age of Onset   Heart attack Mother    Heart attack Father    Hypertension Father       VITAL SIGNS BP 128/70   Pulse 80   Temp (!) 97.1 F (36.2 C)   Resp 18   Ht _0  (1.549 m)   Wt 120 lb (54.4 kg)   SpO2 96%   BMI 22.67 kg/m   Outpatient Encounter Medications as of 11/24/2021  Medication Sig   acetaminophen (TYLENOL) 650 MG CR tablet Take 650 mg by mouth every 8 (eight) hours as needed.   Balsam [place]-Castor Oil [facility]) OINT Special Instructions: Apply to sacrum and bilateral buttocks qshift for prevention. Every Shift Day, Evening, Night   busPIRone (BUSPAR) 5 MG tablet Take 5 mg by mouth at bedtime.   hydrocortisone cream (PREPARATION H) 1 % Apply 1 Application topically daily as needed for itching.   levothyroxine (SYNTHROID) 100 MCG tablet Take 100 mcg by mouth daily.  loratadine (CLARITIN) 10 MG tablet Take 10 mg by mouth daily. Chronic Allergies   mirabegron ER (MYRBETRIQ) 25 MG TB24 tablet Take 25 mg by mouth daily.   NON FORMULARY Regular diet   oxyCODONE (OXY IR/ROXICODONE) 5 MG immediate release tablet Take 1 tablet (5 mg total) by mouth every 8 (eight) hours.   Pramox-PE-Glycerin-Petrolatum (HEMORRHOIDAL EX) Apply topically. 0.25-1 %; rectal Special Instructions: Apply rectally daily PRN hemorrhoids   senna-docusate (SENOKOT-S) 8.6-50 MG tablet Take 1 tablet by mouth 2 (two) times daily.   witch hazel-glycerin (TUCKS) pad Apply 1 application topically 3 (three) times daily as needed for itching. Special Instructions: Apply Tucks pad to hemorrhoids   No facility-administered encounter medications on file as of 11/24/2021.     SIGNIFICANT DIAGNOSTIC EXAMS   NO NEW EXAMS.     LABS REVIEWED  PREVIOUS    08-26-21: wbc 7.3; hgb 12.0; hct 37.5; mcv 93.5 plt 206; glucose 75; bun 57; creat 1.64; k+ 3.8; na++ 138; ca 8.2; gfr 28; protein 5.1; albumin 2.1; alt 58; alk phos 316 tsh 4.310  NO NEW LABS.   Review of Systems  Unable to perform ROS: Dementia    Physical Exam Constitutional:      General: She is not in acute distress.    Appearance: She is well-developed. She is not diaphoretic.  Neck:     Thyroid: No thyromegaly.  Cardiovascular:     Rate and Rhythm: Normal rate and regular rhythm.     Pulses: Normal pulses.     Heart sounds: Normal heart sounds.  Pulmonary:     Effort: Pulmonary effort is normal. No respiratory distress.     Breath sounds: Normal breath sounds.  Abdominal:     General: Bowel sounds are normal. There is no distension.     Palpations: Abdomen is soft.     Tenderness: There is no abdominal tenderness.  Musculoskeletal:        General: Normal range of motion.     Cervical back: Neck supple.     Right lower leg: No edema.     Left lower leg: No edema.  Lymphadenopathy:     Cervical: No cervical adenopathy.  Skin:    General: Skin is warm and dry.     Comments: Has pemphigoid rash present   Neurological:     Mental Status: She is alert. Mental status is at baseline.  Psychiatric:        Mood and Affect: Mood normal.       ASSESSMENT/ PLAN:  TODAY  Urinary incontinence: is frequently incontinent of urine;  I doubt that myrbetriq is providing her any benefit will stop this medication at this time.   2. Pemphigoid will continue doxycycline 100 mg twice daily through 12-06-21. Will begin prednisone 20 mg daily through 12-09-21 on 12-10-21 will lower dose to 5 mg daily long term.   3.  Severe vascular dementia without behavioral disturbance; psychotic disturbance and mood disturbance: weight is 120 pounds is presently stable; is on buspar 5 mg daily to better manage her anxiety.   4. Chronic generalized pain: is managed  will continue oxycodone 5 mg every 8 hours routinely.   PREVIOUS   5. Elevated liver enzymes: is chronic: ast 76; alt 113; alk phos 598; will monitor   6. Failure to thrive in adult: weight is 120 pounds; albumin 2.1 will continue supplements; unable to tolerate remeron.   7. Acquired hypothyroidism; tsh 1.874 will continue synthroid 100 mcg daily   8. Chronic  constipation: will continue senna s  twice daily   9. CKD stage 3b: bun 57; creat 1.64 gfr 28   10. Non-seasonal chronic allergic rhinitis: will continue claritin 10 mg daily     Ok Edwards NP [facility]  call [phone]

## 2021-11-26 ENCOUNTER — Other Ambulatory Visit: Payer: Self-pay | Admitting: Adult Health

## 2021-11-26 ENCOUNTER — Encounter: Payer: Self-pay | Admitting: Adult Health

## 2021-11-26 DIAGNOSIS — M8580 Other specified disorders of bone density and structure, unspecified site: Secondary | ICD-10-CM | POA: Insufficient documentation

## 2021-11-26 MED ORDER — OXYCODONE HCL 5 MG PO TABS: 5.0000 mg | ORAL_TABLET | Freq: Three times a day (TID) | ORAL | 0 refills | Status: DC

## 2021-12-10 ENCOUNTER — Other Ambulatory Visit: Payer: Self-pay | Admitting: Adult Health

## 2021-12-10 MED ORDER — OXYCODONE HCL 5 MG PO TABS: 5.0000 mg | ORAL_TABLET | Freq: Three times a day (TID) | ORAL | 0 refills | Status: DC

## 2021-12-20 ENCOUNTER — Encounter: Payer: Self-pay | Admitting: Adult Health

## 2021-12-20 ENCOUNTER — Non-Acute Institutional Stay (SKILLED_NURSING_FACILITY): Payer: Medicare HMO | Admitting: Adult Health

## 2021-12-20 DIAGNOSIS — R627 Adult failure to thrive: Secondary | ICD-10-CM | POA: Diagnosis not present

## 2021-12-20 DIAGNOSIS — S91002A Unspecified open wound, left ankle, initial encounter: Secondary | ICD-10-CM

## 2021-12-20 DIAGNOSIS — R748 Abnormal levels of other serum enzymes: Secondary | ICD-10-CM | POA: Diagnosis not present

## 2021-12-20 DIAGNOSIS — E039 Hypothyroidism, unspecified: Secondary | ICD-10-CM | POA: Diagnosis not present

## 2021-12-20 NOTE — Progress Notes (Signed)
Location:  [place] Room Number: 145 Place of Service:  SNF (31)   CODE STATUS: dnr   Allergies  Allergen Reactions   Cephalexin Other (See Comments)    Pt. Doesn't remember.    Cephalexin Other (See Comments)    Pt. Doesn't remember.    Penicillins Other (See Comments)    Pt. Cant remember.     Chief Complaint  Patient presents with   Medical Management of Chronic Issues                Elevated liver enzymes:Failure to thrive in adult Acquired hypothyroidism    HPI:  She is a 88 year old long term resident of this facility being seen for the management of her chronic illnesses: Elevated liver enzymes:Failure to thrive in adult Acquired hypothyroidism. She has an outer left ankle ulceration; which is inflamed and has redness present. There are no reports of uncontrolled pain.   Past Medical History:  Diagnosis Date   Hypokalemia 06/11/2019   Thyroid disease     Past Surgical History:  Procedure Laterality Date   ABDOMINAL HYSTERECTOMY     TONSILLECTOMY     TOTAL HIP ARTHROPLASTY     left    Social History   Socioeconomic History   Marital status: Widowed    Spouse name: Not on file   Number of children: Not on file   Years of education: Not on file   Highest education level: Not on file  Occupational History   Occupation: retired   Tobacco Use   Smoking status: Never   Smokeless tobacco: Never  Vaping Use   Vaping Use: Never used  Substance and Sexual Activity   Alcohol use: No   Drug use: No   Sexual activity: Not Currently  Other Topics Concern   Not on file  Social History Narrative   Long term resident of [facility] .   Social Determinants of Health   Financial Resource Strain: Not on file  Food Insecurity: Not on file  Transportation Needs: Not on file  Physical Activity: Not on file  Stress: Not on file  Social Connections: Not on file  Intimate Partner Violence: Not on file   Family History  Problem Relation Age  of Onset   Heart attack Mother    Heart attack Father    Hypertension Father       VITAL SIGNS BP (!) 160/64   Pulse (!) 53   Temp (!) 97.5 F (36.4 C)   Resp 16   Ht 5' 1"  (1.549 m)   Wt 116 lb 6.4 oz (52.8 kg)   SpO2 96%   BMI 21.99 kg/m   Outpatient Encounter Medications as of 12/20/2021  Medication Sig   acetaminophen (TYLENOL) 650 MG CR tablet Take 650 mg by mouth every 8 (eight) hours as needed.   Balsam [place]-Castor Oil [facility]) OINT Special Instructions: Apply to sacrum and bilateral buttocks qshift for prevention. Every Shift Day, Evening, Night   busPIRone (BUSPAR) 5 MG tablet Take 5 mg by mouth at bedtime.   diphenhydrAMINE (BENADRYL ALLERGY) 25 MG tablet Take 25 mg by mouth every 8 (eight) hours as needed.   hydrocortisone cream (PREPARATION H) 1 % Apply 1 Application topically daily as needed for itching.   levothyroxine (SYNTHROID) 100 MCG tablet Take 100 mcg by mouth daily.   loratadine (CLARITIN) 10 MG tablet Take 10 mg by mouth daily. Chronic Allergies   NON FORMULARY Regular diet   oxyCODONE (OXY  IR/ROXICODONE) 5 MG immediate release tablet Take 1 tablet (5 mg total) by mouth every 8 (eight) hours.   Pramox-PE-Glycerin-Petrolatum (HEMORRHOIDAL EX) Apply topically. 0.25-1 %; rectal Special Instructions: Apply rectally daily PRN hemorrhoids   predniSONE (DELTASONE) 5 MG tablet Take 5 mg by mouth daily with breakfast.   senna-docusate (SENOKOT-S) 8.6-50 MG tablet Take 1 tablet by mouth 2 (two) times daily.   witch hazel-glycerin (TUCKS) pad Apply 1 application topically 3 (three) times daily as needed for itching. Special Instructions: Apply Tucks pad to hemorrhoids   No facility-administered encounter medications on file as of 12/20/2021.     SIGNIFICANT DIAGNOSTIC EXAMS   NO NEW EXAMS.    LABS REVIEWED  PREVIOUS    08-26-21: wbc 7.3; hgb 12.0; hct 37.5; mcv 93.5 plt 206; glucose 75; bun 57; creat 1.64; k+ 3.8; na++ 138; ca 8.2; gfr 28; protein 5.1;  albumin 2.1; alt 58; alk phos 316 tsh 4.310  NO NEW LABS.   Review of Systems  Unable to perform ROS: Dementia   Physical Exam Constitutional:      General: She is not in acute distress.    Appearance: She is well-developed. She is not diaphoretic.  Neck:     Thyroid: No thyromegaly.  Cardiovascular:     Rate and Rhythm: Normal rate and regular rhythm.     Pulses: Normal pulses.     Heart sounds: Normal heart sounds.  Pulmonary:     Effort: Pulmonary effort is normal. No respiratory distress.     Breath sounds: Normal breath sounds.  Abdominal:     General: Bowel sounds are normal. There is no distension.     Palpations: Abdomen is soft.     Tenderness: There is no abdominal tenderness.  Musculoskeletal:        General: Normal range of motion.     Cervical back: Neck supple.     Right lower leg: No edema.     Left lower leg: No edema.  Lymphadenopathy:     Cervical: No cervical adenopathy.  Skin:    General: Skin is warm and dry.     Comments: No pemphigoid out break at this time Left outer ankle; slough is present in wound bed; there is erythema present surrounding the wound bed which is warm to touch   Neurological:     Mental Status: She is alert. Mental status is at baseline.  Psychiatric:        Mood and Affect: Mood normal.       ASSESSMENT/ PLAN:  TODAY  Elevated liver enzymes:is chronic: ast 76; alt 113; alk phos 598; will monitor  2. Failure to thrive in adult: weight is 116 ounds; albumin 2.1; did not tolerate remeron; will continue supplements as indicated   3. Acquired hypothyroidism: tsh 1.874 will continue synthroid 100 mcg daily   4. Left ankle ulceration: will begin doxycycline 100 mg twice daily through 01-03-22.    PREVIOUS   5. Chronic constipation: will continue senna s  twice daily   6. CKD stage 3b: bun 57; creat 1.64 gfr 28   7. Non-seasonal chronic allergic rhinitis: will continue claritin 10 mg daily   8. Urinary incontinence: is  frequently incontinent of urine;  is off  myrbetriq I  9. Pemphigoid will continue prednisone 5 mg daily long term.   10.  Severe vascular dementia without behavioral disturbance; psychotic disturbance and mood disturbance: weight is 116 pounds is presently stable; is on buspar 5 mg daily to better manage her  anxiety.   11. Chronic generalized pain: is managed will continue oxycodone 5 mg every 8 hours routinely.     Ok Edwards NP [facility]   call [phone]

## 2022-01-06 DIAGNOSIS — D649 Anemia, unspecified: Secondary | ICD-10-CM | POA: Diagnosis not present

## 2022-01-06 DIAGNOSIS — Z1383 Encounter for screening for respiratory disorder NEC: Secondary | ICD-10-CM | POA: Diagnosis not present

## 2022-01-06 DIAGNOSIS — Z1159 Encounter for screening for other viral diseases: Secondary | ICD-10-CM | POA: Diagnosis not present

## 2022-01-12 ENCOUNTER — Other Ambulatory Visit: Payer: Self-pay | Admitting: Adult Health

## 2022-01-12 MED ORDER — OXYCODONE HCL 5 MG PO TABS: 5.0000 mg | ORAL_TABLET | Freq: Three times a day (TID) | ORAL | 0 refills | Status: DC

## 2022-01-14 ENCOUNTER — Non-Acute Institutional Stay (SKILLED_NURSING_FACILITY): Payer: Medicare HMO | Admitting: Adult Health

## 2022-01-14 ENCOUNTER — Encounter: Payer: Self-pay | Admitting: Adult Health

## 2022-01-14 DIAGNOSIS — F01C0 Vascular dementia, severe, without behavioral disturbance, psychotic disturbance, mood disturbance, and anxiety: Secondary | ICD-10-CM | POA: Diagnosis not present

## 2022-01-14 DIAGNOSIS — R69 Illness, unspecified: Secondary | ICD-10-CM | POA: Diagnosis not present

## 2022-01-14 DIAGNOSIS — N1832 Chronic kidney disease, stage 3b: Secondary | ICD-10-CM | POA: Diagnosis not present

## 2022-01-14 DIAGNOSIS — R627 Adult failure to thrive: Secondary | ICD-10-CM | POA: Diagnosis not present

## 2022-01-14 NOTE — Progress Notes (Signed)
Location:  [place] Room Number: 145/W Place of Service:  SNF (31)   CODE STATUS: dnr   Allergies  Allergen Reactions   Cephalexin Other (See Comments)    Pt. Doesn't remember.    Cephalexin Other (See Comments)    Pt. Doesn't remember.    Penicillins Other (See Comments)    Pt. Cant remember.     Chief Complaint  Patient presents with   Acute Visit    Care plan meeting    HPI:  We have come together for her care plan meeting. Family present  BIMS 5/15 mood 2/30: nervous at times. She is nonambulatory has had one fall without injury. She requires extensive assist with her adls. She is frequently incontinent of bladder and bowel. Dietary:  weight is 116.4 pounds; regular diet has variable appetite; does have ensure. Therapy: none at this time. Activities: stays in room . She continues to be followed by hospice care. She will continue to be followed for her chronic illnesses including:  Severe vascular dementia without behavioral disturbance; psychotic disturbance; mood disturbance or anxiety Stage 3b chronic kidney disease  Failure to thrive in adult  Past Medical History:  Diagnosis Date   Hypokalemia 06/11/2019   Thyroid disease     Past Surgical History:  Procedure Laterality Date   ABDOMINAL HYSTERECTOMY     TONSILLECTOMY     TOTAL HIP ARTHROPLASTY     left    Social History   Socioeconomic History   Marital status: Widowed    Spouse name: Not on file   Number of children: Not on file   Years of education: Not on file   Highest education level: Not on file  Occupational History   Occupation: retired   Tobacco Use   Smoking status: Never   Smokeless tobacco: Never  Vaping Use   Vaping Use: Never used  Substance and Sexual Activity   Alcohol use: No   Drug use: No   Sexual activity: Not Currently  Other Topics Concern   Not on file  Social History Narrative   Long term resident of [facility] .   Social Determinants of Health    Financial Resource Strain: Not on file  Food Insecurity: Not on file  Transportation Needs: Not on file  Physical Activity: Not on file  Stress: Not on file  Social Connections: Not on file  Intimate Partner Violence: Not on file   Family History  Problem Relation Age of Onset   Heart attack Mother    Heart attack Father    Hypertension Father       VITAL SIGNS BP 127/70   Pulse 61   Temp (!) 97.4 F (36.3 C)   Resp 16   Ht 5' 1"  (1.549 m)   Wt 116 lb 6.4 oz (52.8 kg)   SpO2 94%   BMI 21.99 kg/m   Outpatient Encounter Medications as of 01/14/2022  Medication Sig   acetaminophen (TYLENOL) 650 MG CR tablet Take 650 mg by mouth every 8 (eight) hours as needed.   Balsam [place]-Castor Oil [facility]) OINT Special Instructions: Apply to sacrum and bilateral buttocks qshift for prevention. Every Shift Day, Evening, Night   busPIRone (BUSPAR) 5 MG tablet Take 5 mg by mouth at bedtime.   hydrocortisone cream (PREPARATION H) 1 % Apply 1 Application topically daily as needed for itching.   levothyroxine (SYNTHROID) 100 MCG tablet Take 100 mcg by mouth daily.   loratadine (CLARITIN) 10 MG tablet Take 10  mg by mouth daily. Chronic Allergies   NON FORMULARY Regular diet   oxyCODONE (OXY IR/ROXICODONE) 5 MG immediate release tablet Take 1 tablet (5 mg total) by mouth every 8 (eight) hours.   Pramox-PE-Glycerin-Petrolatum (HEMORRHOIDAL EX) Apply topically. 0.25-1 %; rectal Special Instructions: Apply rectally daily PRN hemorrhoids   predniSONE (DELTASONE) 5 MG tablet Take 5 mg by mouth daily with breakfast.   senna-docusate (SENOKOT-S) 8.6-50 MG tablet Take 1 tablet by mouth 2 (two) times daily.   witch hazel-glycerin (TUCKS) pad Apply 1 application topically 3 (three) times daily as needed for itching. Special Instructions: Apply Tucks pad to hemorrhoids   diphenhydrAMINE (BENADRYL ALLERGY) 25 MG tablet Take 25 mg by mouth every 8 (eight) hours as needed.   No facility-administered  encounter medications on file as of 01/14/2022.     SIGNIFICANT DIAGNOSTIC EXAMS  NO NEW EXAMS.    LABS REVIEWED  PREVIOUS    08-26-21: wbc 7.3; hgb 12.0; hct 37.5; mcv 93.5 plt 206; glucose 75; bun 57; creat 1.64; k+ 3.8; na++ 138; ca 8.2; gfr 28; protein 5.1; albumin 2.1; alt 58; alk phos 316 tsh 4.310  NO NEW LABS.    Review of Systems  Unable to perform ROS: Dementia   Physical Exam Constitutional:      General: She is not in acute distress.    Appearance: She is well-developed. She is not diaphoretic.  Neck:     Thyroid: No thyromegaly.  Cardiovascular:     Rate and Rhythm: Normal rate and regular rhythm.     Pulses: Normal pulses.     Heart sounds: Normal heart sounds.  Pulmonary:     Effort: Pulmonary effort is normal. No respiratory distress.     Breath sounds: Normal breath sounds.  Abdominal:     General: Bowel sounds are normal. There is no distension.     Palpations: Abdomen is soft.     Tenderness: There is no abdominal tenderness.  Musculoskeletal:        General: Normal range of motion.     Cervical back: Neck supple.     Right lower leg: No edema.     Left lower leg: No edema.  Lymphadenopathy:     Cervical: No cervical adenopathy.  Skin:    General: Skin is warm and dry.  Neurological:     Mental Status: She is alert. Mental status is at baseline.  Psychiatric:        Mood and Affect: Mood normal.       ASSESSMENT/ PLAN:  TODAY  Severe vascular dementia without behavioral disturbance; psychotic disturbance; mood disturbance or anxiety Stage 3b chronic kidney disease Failure to thrive in adult   Will continue current medications Will continue current plan of care Will continue to monitor his status  Time spent with patient: 40 minutes; medications; plan of care dietary.      Ok Edwards NP [facility] Adult [facility]  call [phone]

## 2022-01-17 DIAGNOSIS — Z1159 Encounter for screening for other viral diseases: Secondary | ICD-10-CM | POA: Diagnosis not present

## 2022-01-17 DIAGNOSIS — F03C0 Unspecified dementia, severe, without behavioral disturbance, psychotic disturbance, mood disturbance, and anxiety: Secondary | ICD-10-CM | POA: Diagnosis not present

## 2022-01-17 DIAGNOSIS — Z1152 Encounter for screening for COVID-19: Secondary | ICD-10-CM | POA: Diagnosis not present

## 2022-01-19 DIAGNOSIS — R69 Illness, unspecified: Secondary | ICD-10-CM | POA: Diagnosis not present

## 2022-01-19 DIAGNOSIS — Z1159 Encounter for screening for other viral diseases: Secondary | ICD-10-CM | POA: Diagnosis not present

## 2022-01-21 ENCOUNTER — Non-Acute Institutional Stay (SKILLED_NURSING_FACILITY): Payer: Medicare HMO | Admitting: Adult Health

## 2022-01-21 ENCOUNTER — Encounter: Payer: Self-pay | Admitting: Adult Health

## 2022-01-21 DIAGNOSIS — Z0000 Encounter for general adult medical examination without abnormal findings: Secondary | ICD-10-CM | POA: Diagnosis not present

## 2022-01-21 NOTE — Progress Notes (Signed)
Subjective:   Kimberly Velazquez is a 88 y.o. female who presents for Medicare Annual (Subsequent) preventive examination.  Review of Systems    Review of Systems  Unable to perform ROS: Dementia    Cardiac Risk Factors include: advanced age (>97men, >15 women);sedentary lifestyle     Objective:    Today's Vitals   01/21/22 0911 01/21/22 1311  BP: 124/80   Pulse: 80   Resp: 18   Temp: (!) 97.5 F (36.4 C)   TempSrc: Temporal   SpO2: 93%   Weight: 116 lb 6.4 oz (52.8 kg)   Height: 5\' 1"  (1.549 m)   PainSc:  0-No pain   Body mass index is 21.99 kg/m.     01/21/2022    9:55 AM 01/14/2022    9:49 AM 11/24/2021    9:36 AM 10/25/2021    8:28 AM 10/07/2021   11:14 AM 08/26/2021   10:45 AM 08/23/2021   10:42 AM  Advanced Directives  Does Patient Have a Medical Advance Directive? Yes Yes Yes Yes Yes Yes Yes  Type of Paramedic of [place];Living will;Out of facility DNR (pink MOST or yellow form) [place];Living will;Out of facility DNR (pink MOST or yellow form) [place];Living will;Out of facility DNR (pink MOST or yellow form) [place];Living will;Out of facility DNR (pink MOST or yellow form) [place];Living will;Out of facility DNR (pink MOST or yellow form) [place];Living will;Out of facility DNR (pink MOST or yellow form) [place];Living will;Out of facility DNR (pink MOST or yellow form)  Does patient want to make changes to medical advance directive?  No - Patient declined No - Patient declined No - Patient declined No - Patient declined No - Patient declined No - Patient declined  Copy of [place] in Chart? Yes - validated most recent copy scanned in chart (See row information) Yes - validated most recent copy scanned in chart (See row information) Yes - validated most recent copy scanned in chart (See row  information) Yes - validated most recent copy scanned in chart (See row information) Yes - validated most recent copy scanned in chart (See row information) Yes - validated most recent copy scanned in chart (See row information) Yes - validated most recent copy scanned in chart (See row information)  Pre-existing out of facility DNR order (yellow form or pink MOST form) Yellow form placed in chart (order not valid for inpatient use) Yellow form placed in chart (order not valid for inpatient use)   Yellow form placed in chart (order not valid for inpatient use) Yellow form placed in chart (order not valid for inpatient use) Yellow form placed in chart (order not valid for inpatient use)    Current Medications (verified) Outpatient Encounter Medications as of 01/21/2022  Medication Sig   acetaminophen (TYLENOL) 650 MG CR tablet Take 650 mg by mouth every 8 (eight) hours as needed.   busPIRone (BUSPAR) 5 MG tablet Take 5 mg by mouth at bedtime.   calcium carbonate (OSCAL) 1500 (600 Ca) MG TABS tablet Take 1,500 mg by mouth once.   hydrocortisone cream (PREPARATION H) 1 % Apply 1 Application topically daily as needed for itching.   levothyroxine (SYNTHROID) 100 MCG tablet Take 100 mcg by mouth daily.   loratadine (CLARITIN) 10 MG tablet Take 10 mg by mouth daily. Chronic Allergies   NON FORMULARY Regular diet   oxyCODONE (OXY IR/ROXICODONE)  5 MG immediate release tablet Take 1 tablet (5 mg total) by mouth every 8 (eight) hours.   predniSONE (DELTASONE) 5 MG tablet Take 5 mg by mouth daily with breakfast.   SANTYL 250 UNIT/GM ointment Apply 250 g topically.   senna-docusate (SENOKOT-S) 8.6-50 MG tablet Take 1 tablet by mouth 2 (two) times daily.   witch hazel-glycerin (TUCKS) pad Apply 1 application topically 3 (three) times daily as needed for itching. Special Instructions: Apply Tucks pad to hemorrhoids   diphenhydrAMINE (BENADRYL ALLERGY) 25 MG tablet Take 25 mg by mouth every 8 (eight) hours as  needed.   [DISCONTINUED] Balsam [place]-Castor Oil [facility]) OINT Special Instructions: Apply to sacrum and bilateral buttocks qshift for prevention. Every Shift Day, Evening, Night   [DISCONTINUED] Pramox-PE-Glycerin-Petrolatum (HEMORRHOIDAL EX) Apply topically. 0.25-1 %; rectal Special Instructions: Apply rectally daily PRN hemorrhoids   No facility-administered encounter medications on file as of 01/21/2022.    Allergies (verified) Cephalexin, Cephalexin, and Penicillins   History: Past Medical History:  Diagnosis Date   Hypokalemia 06/11/2019   Thyroid disease    Past Surgical History:  Procedure Laterality Date   ABDOMINAL HYSTERECTOMY     TONSILLECTOMY     TOTAL HIP ARTHROPLASTY     left   Family History  Problem Relation Age of Onset   Heart attack Mother    Heart attack Father    Hypertension Father    Social History   Socioeconomic History   Marital status: Widowed    Spouse name: Not on file   Number of children: Not on file   Years of education: Not on file   Highest education level: Not on file  Occupational History   Occupation: retired   Tobacco Use   Smoking status: Never   Smokeless tobacco: Never  Vaping Use   Vaping Use: Never used  Substance and Sexual Activity   Alcohol use: No   Drug use: No   Sexual activity: Not Currently  Other Topics Concern   Not on file  Social History Narrative   Long term resident of [facility] .   Social Determinants of Health   Financial Resource Strain: Not on file  Food Insecurity: Not on file  Transportation Needs: Not on file  Physical Activity: Not on file  Stress: Not on file  Social Connections: Not on file    Tobacco Counseling Counseling given: Not Answered   Clinical Intake:  Pre-visit preparation completed: Yes  Pain : No/denies pain Pain Score: 0-No pain Faces Pain Scale: No hurt  Faces Pain Scale: No hurt  BMI - recorded: 21.99 Nutritional Status: BMI of 19-24  Normal Nutritional Risks:  Unintentional weight loss, Failure to thrive CBG done?: No Did pt. bring in CBG monitor from home?: No  How often do you need to have someone help you when you read instructions, pamphlets, or other written materials from your doctor or pharmacy?: 5 - Always  Diabetic?no  Interpreter Needed?: No      Activities of Daily Living    01/21/2022    1:13 PM  In your present state of health, do you have any difficulty performing the following activities:  Hearing? 0  Vision? 0  Difficulty concentrating or making decisions? 1  Walking or climbing stairs? 1  Dressing or bathing? 1  Doing errands, shopping? 1  Preparing Food and eating ? Y  Using the Toilet? Y  In the past six months, have you accidently leaked urine? Y  Do you have problems with loss of  bowel control? Y  Managing your Medications? Y  Managing your Finances? Y    Patient Care Team: Gerlene Fee, NP as PCP - [facility], [place] ([place])  Indicate any recent [place] you may have received from other than [facility] providers in the past year (date may be approximate).     Assessment:   This is a routine wellness examination for Terissa.  Hearing/Vision screen No results found.  Dietary issues and exercise activities discussed: Current Exercise Habits: The patient does not participate in regular exercise at present, Exercise limited by: None identified   Goals Addressed             This Visit's Progress    DIET - INCREASE WATER INTAKE   On track    Follow up with Provider as scheduled   On track    General - Client will not be readmitted within 30 days (C-SNP)   On track      Depression Screen    01/21/2022    1:13 PM 06/03/2021   11:22 AM 04/28/2021    2:24 PM 01/19/2021   11:05 AM 11/29/2019   11:00 AM  PHQ 2/9 Scores  PHQ - 2 Score     0  Exception Documentation Other- indicate reason in comment box Other- indicate reason in comment box  Other- indicate reason in comment box Other- indicate reason in comment box   Not completed unable to fully participate unable to participate  unable to participate     Fall Risk    01/21/2022    1:12 PM 11/24/2021    9:36 AM 06/03/2021   11:23 AM 04/28/2021    2:24 PM 01/19/2021   11:04 AM  Fall Risk   Falls in the past year? 0 0 0 0 0  Number falls in past yr: 0 0 0 0 0  Injury with Fall? 1 0 0 0 0  Risk for fall due to : Impaired balance/gait;Impaired mobility History of fall(s) History of fall(s);Impaired balance/gait;Impaired mobility      FALL RISK PREVENTION PERTAINING TO THE HOME:  Any stairs in or around the home? Yes  If so, are there any without handrails? No  Home free of loose throw rugs in walkways, pet beds, electrical cords, etc? Yes  Adequate lighting in your home to reduce risk of falls? Yes   ASSISTIVE DEVICES UTILIZED TO PREVENT FALLS:  Life alert? No  Use of a cane, walker or w/c? Yes  Grab bars in the bathroom? Yes  Shower chair or bench in shower? Yes  Elevated toilet seat or a handicapped toilet? Yes   TIMED UP AND GO:  Was the test performed? No .  Length of time to ambulate 10 feet: nonambulatory     Cognitive Function:    01/21/2022    1:14 PM 01/19/2021   11:06 AM  MMSE - Mini Mental State Exam  Not completed: Unable to complete Unable to complete        Immunizations Immunization History  Administered Date(s) Administered   Fluad Quad(high Dose 65+) 01/11/2022   Influenza,inj,Quad PF,6+ Mos 01/13/2021   Influenza-Unspecified 02/09/2018, 01/17/2020   Moderna Covid-19 Vaccine Bivalent Booster 54yrs & up 02/02/2021   Moderna SARS-COV2 Booster Vaccination 07/22/2020   Moderna Sars-Covid-2 Vaccination 04/25/2019, 05/24/2019, 02/13/2020, 08/31/2021   PNEUMOCOCCAL CONJUGATE-20 05/22/2020   Tdap 02/10/2021   Zoster Recombinat (Shingrix) 12/08/2020, 03/12/2021    TDAP status: Up to date  Flu Vaccine status: Up to date  Pneumococcal  vaccine status: Up to date  Covid-19 vaccine status: Completed vaccines  Qualifies for Shingles Vaccine? No   Zostavax completed No   Shingrix Completed?: No.    Education has been provided regarding the importance of this vaccine. Patient has been advised to call insurance company to determine out of pocket expense if they have not yet received this vaccine. Advised may also receive vaccine at local pharmacy or Health Dept. Verbalized acceptance and understanding.  Screening Tests Health Maintenance  Topic Date Due   COVID-19 Vaccine (5 - Moderna series) 01/01/2022   Pneumonia Vaccine 70+ Years old  Completed   INFLUENZA VACCINE  Completed   HPV VACCINES  Aged Out   DEXA SCAN  Discontinued   TETANUS/TDAP  Discontinued   Zoster Vaccines- Shingrix  Discontinued    Health Maintenance  Health Maintenance Due  Topic Date Due   COVID-19 Vaccine (5 - Moderna series) 01/01/2022    Colorectal cancer screening: No longer required.   Mammogram status: No longer required due to age.  Bone Density status: Completed 11-25-21. Results reflect: Bone density results: OSTEOPOROSIS. Repeat every 2 years.  Lung Cancer Screening: (Low Dose CT Chest recommended if Age 64-80 years, 30 pack-year currently smoking OR have quit w/in 15years.) does not qualify.   Lung Cancer Screening Referral: n/a  Additional Screening:  Hepatitis C Screening: does not qualify;  Vision Screening: Recommended annual ophthalmology exams for early detection of glaucoma and other disorders of the eye. Is the patient up to date with their annual eye exam?  Yes  Who is the provider or what is the name of the office in which the patient attends annual eye exams?  If pt is not established with a provider, would they like to be referred to a provider to establish care? No .   Dental Screening: Recommended annual dental exams for proper oral hygiene  Community Resource Referral / Chronic Care Management: CRR required  this visit?  No   CCM required this visit?  No      Plan:     I have personally reviewed and noted the following in the patient's chart:   Medical and social history Use of alcohol, tobacco or illicit drugs  Current medications and supplements including opioid prescriptions. Patient is currently taking opioid prescriptions. Information provided to patient regarding non-opioid alternatives. Patient advised to discuss non-opioid treatment plan with their provider. Functional ability and status Nutritional status Physical activity Advanced directives List of other physicians Hospitalizations, surgeries, and ER visits in previous 12 months Vitals Screenings to include cognitive, depression, and falls Referrals and appointments  In addition, I have reviewed and discussed with patient certain preventive protocols, quality metrics, and best practice recommendations. A written personalized care plan for preventive services as well as general preventive health recommendations were provided to patient.     Gerlene Fee, NP   01/21/2022   Nurse Notes: the exam has been performed at this facility by myself

## 2022-01-21 NOTE — Patient Instructions (Signed)
  Ms. Lippman , Thank you for taking time to come for your Medicare Wellness Visit. I appreciate your ongoing commitment to your health goals. Please review the following plan we discussed and let me know if I can assist you in the future.   These are the goals we discussed:  Goals      DIET - INCREASE WATER INTAKE     Follow up with Provider as scheduled     General - Client will not be readmitted within 30 days (C-SNP)        This is a list of the screening recommended for you and due dates:  Health Maintenance  Topic Date Due   COVID-19 Vaccine (5 - Moderna series) 01/01/2022   Pneumonia Vaccine  Completed   Flu Shot  Completed   HPV Vaccine  Aged Out   DEXA scan (bone density measurement)  Discontinued   Tetanus Vaccine  Discontinued   Zoster (Shingles) Vaccine  Discontinued

## 2022-01-21 NOTE — Progress Notes (Deleted)
Location:  [facility] Room Number: 145 Place of Service:  SNF (31)   CODE STATUS: DNR  Allergies  Allergen Reactions   Cephalexin Other (See Comments)    Pt. Doesn't remember.    Cephalexin Other (See Comments)    Pt. Doesn't remember.    Penicillins Other (See Comments)    Pt. Cant remember.     Chief Complaint  Patient presents with   Medicare Wellness    Annual Medicare wellness   Immunizations    Discussed the need for COVID 19 Booster.    HPI:    Past Medical History:  Diagnosis Date   Hypokalemia 06/11/2019   Thyroid disease     Past Surgical History:  Procedure Laterality Date   ABDOMINAL HYSTERECTOMY     TONSILLECTOMY     TOTAL HIP ARTHROPLASTY     left    Social History   Socioeconomic History   Marital status: Widowed    Spouse name: Not on file   Number of children: Not on file   Years of education: Not on file   Highest education level: Not on file  Occupational History   Occupation: retired   Tobacco Use   Smoking status: Never   Smokeless tobacco: Never  Vaping Use   Vaping Use: Never used  Substance and Sexual Activity   Alcohol use: No   Drug use: No   Sexual activity: Not Currently  Other Topics Concern   Not on file  Social History Narrative   [facility] resident [facility] .   Social Determinants of Health   Financial Resource Strain: Not on file  Food Insecurity: Not on file  Transportation Needs: Not on file  Physical Activity: Not on file  Stress: Not on file  Social Connections: Not on file  Intimate Partner Violence: Not on file   Family History  Problem Relation Age of Onset   Heart attack Mother    Heart attack Father    Hypertension Father       VITAL SIGNS BP 124/80   Pulse 80   Temp (!) 97.5 F (36.4 C) (Temporal)   Resp 18   Ht 5\' 1"  (1.549 m)   Wt 116 lb 6.4 oz (52.8 kg)   SpO2 93%   BMI 21.99 kg/m   Outpatient Encounter Medications as of 01/21/2022  Medication Sig    acetaminophen (TYLENOL) 650 MG CR tablet Take 650 mg by mouth every 8 (eight) hours as needed.   busPIRone (BUSPAR) 5 MG tablet Take 5 mg by mouth at bedtime.   calcium carbonate (OSCAL) 1500 (600 Ca) MG TABS tablet Take 1,500 mg by mouth once.   hydrocortisone cream (PREPARATION H) 1 % Apply 1 Application topically daily as needed for itching.   levothyroxine (SYNTHROID) 100 MCG tablet Take 100 mcg by mouth daily.   loratadine (CLARITIN) 10 MG tablet Take 10 mg by mouth daily. Chronic Allergies   NON FORMULARY Regular diet   oxyCODONE (OXY IR/ROXICODONE) 5 MG immediate release tablet Take 1 tablet (5 mg total) by mouth every 8 (eight) hours.   predniSONE (DELTASONE) 5 MG tablet Take 5 mg by mouth daily with breakfast.   SANTYL 250 UNIT/GM ointment Apply 250 g topically.   senna-docusate (SENOKOT-S) 8.6-50 MG tablet Take 1 tablet by mouth 2 (two) times daily.   witch hazel-glycerin (TUCKS) pad Apply 1 application topically 3 (three) times daily as needed for itching. Special Instructions: Apply Tucks pad to hemorrhoids   diphenhydrAMINE (  BENADRYL ALLERGY) 25 MG tablet Take 25 mg by mouth every 8 (eight) hours as needed.   [DISCONTINUED] Balsam [place]-Castor Oil [facility]) OINT Special Instructions: Apply to sacrum and bilateral buttocks qshift for prevention. Every Shift Day, Evening, Night   [DISCONTINUED] Pramox-PE-Glycerin-Petrolatum (HEMORRHOIDAL EX) Apply topically. 0.25-1 %; rectal Special Instructions: Apply rectally daily PRN hemorrhoids   No facility-administered encounter medications on file as of 01/21/2022.     SIGNIFICANT DIAGNOSTIC EXAMS       ASSESSMENT/ PLAN:     Synthia Innocent NP [facility] Adult Medicine  Contact [phone] Monday through Friday 8am- 5pm  After hours call [phone]

## 2022-01-24 ENCOUNTER — Encounter: Payer: Self-pay | Admitting: Adult Health

## 2022-01-24 ENCOUNTER — Non-Acute Institutional Stay (SKILLED_NURSING_FACILITY): Payer: Medicare HMO | Admitting: Adult Health

## 2022-01-24 DIAGNOSIS — L129 Pemphigoid, unspecified: Secondary | ICD-10-CM

## 2022-01-24 NOTE — Progress Notes (Signed)
Location:  [facility] Room Number: 145 Place of Service:  SNF (31)   CODE STATUS: dnr   Allergies  Allergen Reactions   Cephalexin Other (See Comments)    Pt. Doesn't remember.    Cephalexin Other (See Comments)    Pt. Doesn't remember.    Penicillins Other (See Comments)    Pt. Cant remember.     Chief Complaint  Patient presents with   Acute Visit    Pemphigoid blisters bilateral hands     HPI:  She has a history if pemphigoid she is on long term prednisone 5 mg daily for prevention. Staff reports that she is getting blisters once again. There are no reports of fevers. Her hands are slightly inflamed. No complaints of pain present.   Past Medical History:  Diagnosis Date   Hypokalemia 06/11/2019   Thyroid disease     Past Surgical History:  Procedure Laterality Date   ABDOMINAL HYSTERECTOMY     TONSILLECTOMY     TOTAL HIP ARTHROPLASTY     left    Social History   Socioeconomic History   Marital status: Widowed    Spouse name: Not on file   Number of children: Not on file   Years of education: Not on file   Highest education level: Not on file  Occupational History   Occupation: retired   Tobacco Use   Smoking status: Never   Smokeless tobacco: Never  Vaping Use   Vaping Use: Never used  Substance and Sexual Activity   Alcohol use: No   Drug use: No   Sexual activity: Not Currently  Other Topics Concern   Not on file  Social History Narrative   Long term resident of [facility] .   Social Determinants of Health   Financial Resource Strain: Not on file  Food Insecurity: Not on file  Transportation Needs: Not on file  Physical Activity: Not on file  Stress: Not on file  Social Connections: Not on file  Intimate Partner Violence: Not on file   Family History  Problem Relation Age of Onset   Heart attack Mother    Heart attack Father    Hypertension Father       VITAL SIGNS BP (!) 96/56   Pulse 86   Temp 97.8 F (36.6  C)   Resp 19   Ht 5' 1"  (1.549 m)   Wt 116 lb 6.4 oz (52.8 kg)   SpO2 95%   BMI 21.99 kg/m   Outpatient Encounter Medications as of 01/24/2022  Medication Sig   acetaminophen (TYLENOL) 650 MG CR tablet Take 650 mg by mouth every 8 (eight) hours as needed.   busPIRone (BUSPAR) 5 MG tablet Take 5 mg by mouth at bedtime.   calcium carbonate (OSCAL) 1500 (600 Ca) MG TABS tablet Take 1,500 mg by mouth once.   hydrocortisone cream (PREPARATION H) 1 % Apply 1 Application topically daily as needed for itching.   levothyroxine (SYNTHROID) 100 MCG tablet Take 100 mcg by mouth daily.   loratadine (CLARITIN) 10 MG tablet Take 10 mg by mouth daily. Chronic Allergies   NON FORMULARY Regular diet   oxyCODONE (OXY IR/ROXICODONE) 5 MG immediate release tablet Take 1 tablet (5 mg total) by mouth every 8 (eight) hours.   predniSONE (DELTASONE) 5 MG tablet Take 5 mg by mouth daily with breakfast.   SANTYL 250 UNIT/GM ointment Apply 250 g topically.   senna-docusate (SENOKOT-S) 8.6-50 MG tablet Take 1 tablet by mouth  2 (two) times daily.   witch hazel-glycerin (TUCKS) pad Apply 1 application topically 3 (three) times daily as needed for itching. Special Instructions: Apply Tucks pad to hemorrhoids   [DISCONTINUED] diphenhydrAMINE (BENADRYL ALLERGY) 25 MG tablet Take 25 mg by mouth every 8 (eight) hours as needed.   No facility-administered encounter medications on file as of 01/24/2022.     SIGNIFICANT DIAGNOSTIC EXAMS   NO NEW EXAMS.    LABS REVIEWED  PREVIOUS    08-26-21: wbc 7.3; hgb 12.0; hct 37.5; mcv 93.5 plt 206; glucose 75; bun 57; creat 1.64; k+ 3.8; na++ 138; ca 8.2; gfr 28; protein 5.1; albumin 2.1; alt 58; alk phos 316 tsh 4.310  NO NEW LABS.   Review of Systems  Unable to perform ROS: Dementia   Physical Exam Constitutional:      General: She is not in acute distress.    Appearance: She is well-developed. She is not diaphoretic.  Neck:     Thyroid: No thyromegaly.   Cardiovascular:     Rate and Rhythm: Normal rate and regular rhythm.     Pulses: Normal pulses.     Heart sounds: Normal heart sounds.  Pulmonary:     Effort: Pulmonary effort is normal. No respiratory distress.     Breath sounds: Normal breath sounds.  Abdominal:     General: Bowel sounds are normal. There is no distension.     Palpations: Abdomen is soft.     Tenderness: There is no abdominal tenderness.  Musculoskeletal:        General: Normal range of motion.     Cervical back: Neck supple.     Right lower leg: No edema.     Left lower leg: No edema.  Lymphadenopathy:     Cervical: No cervical adenopathy.  Skin:    General: Skin is warm and dry.     Comments: Blisters and redness present.   Neurological:     Mental Status: She is alert. Mental status is at baseline.  Psychiatric:        Mood and Affect: Mood normal.       ASSESSMENT/ PLAN:  TODAY  Pemphigoid: will increase her prednisone to 10 mg daily and will observe the need for further interventions.   Ok Edwards NP [facility] Adult Medicine  call [phone]

## 2022-01-28 ENCOUNTER — Non-Acute Institutional Stay (SKILLED_NURSING_FACILITY): Payer: Medicare HMO | Admitting: Adult Health

## 2022-01-28 ENCOUNTER — Encounter: Payer: Self-pay | Admitting: Adult Health

## 2022-01-28 DIAGNOSIS — J3089 Other allergic rhinitis: Secondary | ICD-10-CM

## 2022-01-28 DIAGNOSIS — K5909 Other constipation: Secondary | ICD-10-CM | POA: Diagnosis not present

## 2022-01-28 DIAGNOSIS — N1832 Chronic kidney disease, stage 3b: Secondary | ICD-10-CM | POA: Diagnosis not present

## 2022-01-28 NOTE — Progress Notes (Unsigned)
Location:  [facility] Room Number: NO/145/W Place of Service:  SNF (31)   CODE STATUS: DNR  Allergies  Allergen Reactions   Cephalexin Other (See Comments)    Pt. Doesn't remember.    Cephalexin Other (See Comments)    Pt. Doesn't remember.    Penicillins Other (See Comments)    Pt. Cant remember.     Chief Complaint  Patient presents with   Medical Management of Chronic Issues    Patient is here for a follow up for chronic conditions Patient is due for updated covid vaccine    HPI:    Past Medical History:  Diagnosis Date   Hypokalemia 06/11/2019   Thyroid disease     Past Surgical History:  Procedure Laterality Date   ABDOMINAL HYSTERECTOMY     TONSILLECTOMY     TOTAL HIP ARTHROPLASTY     left    Social History   Socioeconomic History   Marital status: Widowed    Spouse name: Not on file   Number of children: Not on file   Years of education: Not on file   Highest education level: Not on file  Occupational History   Occupation: retired   Tobacco Use   Smoking status: Never   Smokeless tobacco: Never  Vaping Use   Vaping Use: Never used  Substance and Sexual Activity   Alcohol use: No   Drug use: No   Sexual activity: Not Currently  Other Topics Concern   Not on file  Social History Narrative   Long term resident of [facility] .   Social Determinants of Health   Financial Resource Strain: Not on file  Food Insecurity: Not on file  Transportation Needs: Not on file  Physical Activity: Not on file  Stress: Not on file  Social Connections: Not on file  Intimate Partner Violence: Not on file   Family History  Problem Relation Age of Onset   Heart attack Mother    Heart attack Father    Hypertension Father       VITAL SIGNS BP (!) 96/56   Pulse 86   Temp 97.8 F (36.6 C)   Resp 19   Ht 5\' 1"  (1.549 m)   Wt 116 lb (52.6 kg)   SpO2 95%   BMI 21.92 kg/m   Outpatient Encounter Medications as of 01/28/2022   Medication Sig   acetaminophen (TYLENOL) 650 MG CR tablet Take 650 mg by mouth every 8 (eight) hours as needed.   busPIRone (BUSPAR) 5 MG tablet Take 5 mg by mouth at bedtime.   calcium carbonate (OSCAL) 1500 (600 Ca) MG TABS tablet Take 1,500 mg by mouth once.   hydrocortisone cream (PREPARATION H) 1 % Apply 1 Application topically daily as needed for itching.   levothyroxine (SYNTHROID) 100 MCG tablet Take 100 mcg by mouth daily.   loratadine (CLARITIN) 10 MG tablet Take 10 mg by mouth daily. Chronic Allergies   NON FORMULARY Regular diet   oxyCODONE (OXY IR/ROXICODONE) 5 MG immediate release tablet Take 1 tablet (5 mg total) by mouth every 8 (eight) hours.   predniSONE (DELTASONE) 5 MG tablet Take 5 mg by mouth daily with breakfast.   SANTYL 250 UNIT/GM ointment Apply 250 g topically.   senna-docusate (SENOKOT-S) 8.6-50 MG tablet Take 1 tablet by mouth 2 (two) times daily.   witch hazel-glycerin (TUCKS) pad Apply 1 application topically 3 (three) times daily as needed for itching. Special Instructions: Apply Tucks pad to hemorrhoids  No facility-administered encounter medications on file as of 01/28/2022.     SIGNIFICANT DIAGNOSTIC EXAMS       ASSESSMENT/ PLAN:     Ok Edwards NP [facility]  Contact [phone] Monday through Friday 8am- 5pm  After hours call [phone]

## 2022-02-02 DIAGNOSIS — J3089 Other allergic rhinitis: Secondary | ICD-10-CM | POA: Insufficient documentation

## 2022-02-07 ENCOUNTER — Non-Acute Institutional Stay (SKILLED_NURSING_FACILITY): Payer: Medicare HMO | Admitting: Adult Health

## 2022-02-07 DIAGNOSIS — L129 Pemphigoid, unspecified: Secondary | ICD-10-CM | POA: Diagnosis not present

## 2022-02-08 ENCOUNTER — Encounter: Payer: Self-pay | Admitting: Adult Health

## 2022-02-08 ENCOUNTER — Other Ambulatory Visit: Payer: Self-pay | Admitting: Adult Health

## 2022-02-08 MED ORDER — OXYCODONE HCL 5 MG PO TABS: 5.0000 mg | ORAL_TABLET | Freq: Three times a day (TID) | ORAL | 0 refills | Status: DC

## 2022-02-08 NOTE — Progress Notes (Unsigned)
Location:  [place] Room Number: 145 Place of Service:  SNF (31)   CODE STATUS: dnr   Allergies  Allergen Reactions   Cephalexin Other (See Comments)    Pt. Doesn't remember.    Cephalexin Other (See Comments)    Pt. Doesn't remember.    Penicillins Other (See Comments)    Pt. Cant remember.     Chief Complaint  Patient presents with   Acute Visit    Pemphigoid     HPI:  She has pemphigoid bullae on both hands with open bullae on left hand. She has a chronic left ankle ulceration which is presently inflamed with drainage present. There are no reports of fevers present.   Past Medical History:  Diagnosis Date   Hypokalemia 06/11/2019   Thyroid disease     Past Surgical History:  Procedure Laterality Date   ABDOMINAL HYSTERECTOMY     TONSILLECTOMY     TOTAL HIP ARTHROPLASTY     left    Social History   Socioeconomic History   Marital status: Widowed    Spouse name: Not on file   Number of children: Not on file   Years of education: Not on file   Highest education level: Not on file  Occupational History   Occupation: retired   Tobacco Use   Smoking status: Never   Smokeless tobacco: Never  Vaping Use   Vaping Use: Never used  Substance and Sexual Activity   Alcohol use: No   Drug use: No   Sexual activity: Not Currently  Other Topics Concern   Not on file  Social History Narrative   Long term resident of [facility] .   Social Determinants of Health   Financial Resource Strain: Not on file  Food Insecurity: Not on file  Transportation Needs: Not on file  Physical Activity: Not on file  Stress: Not on file  Social Connections: Not on file  Intimate Partner Violence: Not on file   Family History  Problem Relation Age of Onset   Heart attack Mother    Heart attack Father    Hypertension Father       VITAL SIGNS BP 118/68   Pulse 70   Temp 98 F (36.7 C)   Resp 18   Ht 5' 1" (1.549 m)   Wt 116 lb 6.4 oz (52.8 kg)    SpO2 95%   BMI 21.99 kg/m   Outpatient Encounter Medications as of 02/07/2022  Medication Sig   acetaminophen (TYLENOL) 650 MG CR tablet Take 650 mg by mouth every 8 (eight) hours as needed.   busPIRone (BUSPAR) 5 MG tablet Take 5 mg by mouth at bedtime.   calcium carbonate (OSCAL) 1500 (600 Ca) MG TABS tablet Take 1,500 mg by mouth once.   hydrocortisone cream (PREPARATION H) 1 % Apply 1 Application topically daily as needed for itching.   levothyroxine (SYNTHROID) 100 MCG tablet Take 100 mcg by mouth daily.   loratadine (CLARITIN) 10 MG tablet Take 10 mg by mouth daily. Chronic Allergies   NON FORMULARY Regular diet   oxyCODONE (OXY IR/ROXICODONE) 5 MG immediate release tablet Take 1 tablet (5 mg total) by mouth every 8 (eight) hours.   predniSONE (DELTASONE) 10 MG tablet Take 10 mg by mouth daily with breakfast.   SANTYL 250 UNIT/GM ointment Apply 250 g topically.   senna-docusate (SENOKOT-S) 8.6-50 MG tablet Take 1 tablet by mouth 2 (two) times daily.   witch hazel-glycerin (TUCKS) pad Apply 1  application topically 3 (three) times daily as needed for itching. Special Instructions: Apply Tucks pad to hemorrhoids   No facility-administered encounter medications on file as of 02/07/2022.     SIGNIFICANT DIAGNOSTIC EXAMS  NO NEW EXAMS.    LABS REVIEWED  PREVIOUS    08-26-21: wbc 7.3; hgb 12.0; hct 37.5; mcv 93.5 plt 206; glucose 75; bun 57; creat 1.64; k+ 3.8; na++ 138; ca 8.2; gfr 28; protein 5.1; albumin 2.1; alt 58; alk phos 316 tsh 4.310  NO NEW LABS.   Review of Systems  Unable to perform ROS: Dementia    Physical Exam Constitutional:      General: She is not in acute distress.    Appearance: She is well-developed. She is not diaphoretic.  Neck:     Thyroid: No thyromegaly.  Cardiovascular:     Rate and Rhythm: Normal rate and regular rhythm.     Pulses: Normal pulses.     Heart sounds: Normal heart sounds.  Pulmonary:     Effort: Pulmonary effort is normal. No  respiratory distress.     Breath sounds: Normal breath sounds.  Abdominal:     General: Bowel sounds are normal. There is no distension.     Palpations: Abdomen is soft.     Tenderness: There is no abdominal tenderness.  Musculoskeletal:        General: Normal range of motion.     Cervical back: Neck supple.     Right lower leg: No edema.     Left lower leg: No edema.  Lymphadenopathy:     Cervical: No cervical adenopathy.  Skin:    General: Skin is warm and dry.     Comments: Bullae on bilateral hands Left ankle ulceration with inflammation present   Neurological:     Mental Status: She is alert. Mental status is at baseline.  Psychiatric:        Mood and Affect: Mood normal.        ASSESSMENT/ PLAN:  TODAY  Pemphigoid: will begin doxycyline 50 mg twice daily through 02-21-22. Will monitor      NP [facility]   call [phone]   

## 2022-03-02 ENCOUNTER — Non-Acute Institutional Stay (SKILLED_NURSING_FACILITY): Payer: Medicare HMO | Admitting: Adult Health

## 2022-03-02 DIAGNOSIS — F01C0 Vascular dementia, severe, without behavioral disturbance, psychotic disturbance, mood disturbance, and anxiety: Secondary | ICD-10-CM | POA: Diagnosis not present

## 2022-03-02 DIAGNOSIS — R32 Unspecified urinary incontinence: Secondary | ICD-10-CM

## 2022-03-02 DIAGNOSIS — L129 Pemphigoid, unspecified: Secondary | ICD-10-CM | POA: Diagnosis not present

## 2022-03-07 ENCOUNTER — Other Ambulatory Visit: Payer: Self-pay | Admitting: Adult Health

## 2022-03-07 ENCOUNTER — Encounter: Payer: Self-pay | Admitting: Adult Health

## 2022-03-07 MED ORDER — OXYCODONE HCL 5 MG PO TABS: 5.0000 mg | ORAL_TABLET | Freq: Three times a day (TID) | ORAL | 0 refills | Status: DC

## 2022-03-07 NOTE — Progress Notes (Unsigned)
Location:  [place] Room Number: 145 Place of Service:  SNF (31)   CODE STATUS: dnr   Allergies  Allergen Reactions   Cephalexin Other (See Comments)    Pt. Doesn't remember.    Cephalexin Other (See Comments)    Pt. Doesn't remember.    Penicillins Other (See Comments)    Pt. Cant remember.     Chief Complaint  Patient presents with   Medical Management of Chronic Issues                      Urinary incontinence  Pemphigoid:  Severe vascular dementia without behavioral disturbance; psychotic disturbance; mood disturbance; anxiety:  Chronic generalized pain:    HPI:  She is a 88 year old long term resident of this facility being seen for the management of her chronic illnesses: Urinary incontinence  Pemphigoid:  Severe vascular dementia without behavioral disturbance; psychotic disturbance; mood disturbance; anxiety:  Chronic generalized pain. Her rash is being managed at this time. There are no reports of uncontrolled pain. Her appetite is stable. No reports of anxiety present.   Past Medical History:  Diagnosis Date   Hypokalemia 06/11/2019   Thyroid disease     Past Surgical History:  Procedure Laterality Date   ABDOMINAL HYSTERECTOMY     TONSILLECTOMY     TOTAL HIP ARTHROPLASTY     left    Social History   Socioeconomic History   Marital status: Widowed    Spouse name: Not on file   Number of children: Not on file   Years of education: Not on file   Highest education level: Not on file  Occupational History   Occupation: retired   Tobacco Use   Smoking status: Never   Smokeless tobacco: Never  Vaping Use   Vaping Use: Never used  Substance and Sexual Activity   Alcohol use: No   Drug use: No   Sexual activity: Not Currently  Other Topics Concern   Not on file  Social History Narrative   Long term resident of [facility] .   Social Determinants of Health   Financial Resource Strain: Not on file  Food Insecurity: Not on file   Transportation Needs: Not on file  Physical Activity: Not on file  Stress: Not on file  Social Connections: Not on file  Intimate Partner Violence: Not on file   Family History  Problem Relation Age of Onset   Heart attack Mother    Heart attack Father    Hypertension Father       VITAL SIGNS BP 125/76   Pulse (!) 58   Temp (!) 97.4 F (36.3 C)   Resp 20   Ht _0  (1.549 m)   Wt 116 lb 6.4 oz (52.8 kg)   SpO2 96%   BMI 21.99 kg/m   Outpatient Encounter Medications as of 03/02/2022  Medication Sig   acetaminophen (TYLENOL) 650 MG CR tablet Take 650 mg by mouth every 8 (eight) hours as needed.   busPIRone (BUSPAR) 5 MG tablet Take 5 mg by mouth at bedtime.   calcium carbonate (OSCAL) 1500 (600 Ca) MG TABS tablet Take 1,500 mg by mouth once.   hydrocortisone cream (PREPARATION H) 1 % Apply 1 Application topically daily as needed for itching.   levothyroxine (SYNTHROID) 100 MCG tablet Take 100 mcg by mouth daily.   loratadine (CLARITIN) 10 MG tablet Take 10 mg by mouth daily. Chronic Allergies   NON FORMULARY Regular  diet   predniSONE (DELTASONE) 10 MG tablet Take 10 mg by mouth daily with breakfast.   SANTYL 250 UNIT/GM ointment Apply 250 g topically.   senna-docusate (SENOKOT-S) 8.6-50 MG tablet Take 1 tablet by mouth 2 (two) times daily.   witch hazel-glycerin (TUCKS) pad Apply 1 application topically 3 (three) times daily as needed for itching. Special Instructions: Apply Tucks pad to hemorrhoids    oxyCODONE (OXY IR/ROXICODONE) 5 MG immediate release tablet Take 1 tablet (5 mg total) by mouth every 8 (eight) hours.   No facility-administered encounter medications on file as of 03/02/2022.     SIGNIFICANT DIAGNOSTIC EXAMS  NO NEW EXAMS.    LABS REVIEWED  PREVIOUS    08-26-21: wbc 7.3; hgb 12.0; hct 37.5; mcv 93.5 plt 206; glucose 75; bun 57; creat 1.64; k+ 3.8; na++ 138; ca 8.2; gfr 28; protein 5.1; albumin 2.1; alt 58; alk phos 316 tsh 4.310  NO NEW LABS.    Review of Systems  Unable to perform ROS: Dementia   Physical Exam Constitutional:      General: She is not in acute distress.    Appearance: She is well-developed. She is not diaphoretic.  Neck:     Thyroid: No thyromegaly.  Cardiovascular:     Rate and Rhythm: Normal rate and regular rhythm.     Pulses: Normal pulses.     Heart sounds: Normal heart sounds.  Pulmonary:     Effort: Pulmonary effort is normal. No respiratory distress.     Breath sounds: Normal breath sounds.  Abdominal:     General: Bowel sounds are normal. There is no distension.     Palpations: Abdomen is soft.     Tenderness: There is no abdominal tenderness.  Musculoskeletal:        General: Normal range of motion.     Cervical back: Neck supple.     Right lower leg: No edema.     Left lower leg: No edema.  Lymphadenopathy:     Cervical: No cervical adenopathy.  Skin:    General: Skin is warm and dry.  Neurological:     Mental Status: She is alert. Mental status is at baseline.  Psychiatric:        Mood and Affect: Mood normal.        ASSESSMENT/ PLAN:  TODAY  Urinary incontinence is off myrbetriq; as this medication was not providing any benefit   2. Pemphigoid: on long term prednisone 10 mg daily   3. Severe vascular dementia without behavioral disturbance; psychotic disturbance; mood disturbance; anxiety: weight is 116 pounds; is on buspar 5 mg daily to manage her anxiety  4. Chronic generalized pain: will continue oxycodone 5 mg every 8 hours   PREVIOUS   5. Elevated liver enzymes:is chronic: ast 76; alt 113; alk phos 598; will monitor  6. Failure to thrive in adult: weight is 116 ounds; albumin 2.1; did not tolerate remeron; will continue supplements as indicated   7. Acquired hypothyroidism: tsh 1.874 will continue synthroid 100 mcg daily   8. Chronic constipation: senna s twice daily  9. CKD stage 3b: bun 57; creat 1.64; gfr 28  10. Non-seasonal chronic allergic rhinitis:  will continue claritin 10 mg  daily       Ok Edwards NP [facility]   call [phone]

## 2022-03-23 ENCOUNTER — Encounter: Payer: Self-pay | Admitting: Adult Health

## 2022-03-23 NOTE — Progress Notes (Signed)
Location:  [facility] Room Number: 145 Place of Service:  SNF (31)   CODE STATUS:   Allergies  Allergen Reactions   Cephalexin Other (See Comments)    Pt. Doesn't remember.    Cephalexin Other (See Comments)    Pt. Doesn't remember.    Penicillins Other (See Comments)    Pt. Cant remember.     Chief Complaint  Patient presents with   Acute Visit    Blisters    Error    HPI:    Past Medical History:  Diagnosis Date   Hypokalemia 06/11/2019   Thyroid disease     Past Surgical History:  Procedure Laterality Date   ABDOMINAL HYSTERECTOMY     TONSILLECTOMY     TOTAL HIP ARTHROPLASTY     left    Social History   Socioeconomic History   Marital status: Widowed    Spouse name: Not on file   Number of children: Not on file   Years of education: Not on file   Highest education level: Not on file  Occupational History   Occupation: retired   Tobacco Use   Smoking status: Never   Smokeless tobacco: Never  Vaping Use   Vaping Use: Never used  Substance and Sexual Activity   Alcohol use: No   Drug use: No   Sexual activity: Not Currently  Other Topics Concern   Not on file  Social History Narrative   Long term resident of [facility] .   Social Determinants of Health   Financial Resource Strain: Not on file  Food Insecurity: Not on file  Transportation Needs: Not on file  Physical Activity: Not on file  Stress: Not on file  Social Connections: Not on file  Intimate Partner Violence: Not on file   Family History  Problem Relation Age of Onset   Heart attack Mother    Heart attack Father    Hypertension Father       VITAL SIGNS BP 118/77   Pulse 82   Temp (!) 97.3 F (36.3 C)   Resp 20   Ht 5\' 1"  (1.549 m)   Wt 115 lb 6.4 oz (52.3 kg)   SpO2 98%   BMI 21.80 kg/m   Outpatient Encounter Medications as of 03/23/2022  Medication Sig   acetaminophen (TYLENOL) 650 MG CR tablet Take 650 mg by mouth every 8 (eight) hours as  needed.   busPIRone (BUSPAR) 5 MG tablet Take 5 mg by mouth at bedtime.   calcium carbonate (OSCAL) 1500 (600 Ca) MG TABS tablet Take 1,500 mg by mouth once.   hydrocortisone cream (PREPARATION H) 1 % Apply 1 Application topically daily as needed for itching.   levothyroxine (SYNTHROID) 100 MCG tablet Take 100 mcg by mouth daily.   loratadine (CLARITIN) 10 MG tablet Take 10 mg by mouth daily. Chronic Allergies   NON FORMULARY Regular diet   oxyCODONE (OXY IR/ROXICODONE) 5 MG immediate release tablet Take 1 tablet (5 mg total) by mouth every 8 (eight) hours.   predniSONE (DELTASONE) 10 MG tablet Take 10 mg by mouth daily with breakfast.   SANTYL 250 UNIT/GM ointment Apply 250 g topically.   senna-docusate (SENOKOT-S) 8.6-50 MG tablet Take 1 tablet by mouth 2 (two) times daily.   witch hazel-glycerin (TUCKS) pad Apply 1 application topically 3 (three) times daily as needed for itching. Special Instructions: Apply Tucks pad to hemorrhoids   No facility-administered encounter medications on file as of 03/23/2022.  SIGNIFICANT DIAGNOSTIC EXAMS       ASSESSMENT/ PLAN:     Synthia Innocent NP [facility]  Contact [phone] Monday through Friday 8am- 5pm  After hours call [phone]   This encounter was created in error - please disregard.

## 2022-03-29 ENCOUNTER — Non-Acute Institutional Stay (SKILLED_NURSING_FACILITY): Payer: Medicare HMO | Admitting: Adult Health

## 2022-03-29 ENCOUNTER — Encounter: Payer: Self-pay | Admitting: Adult Health

## 2022-03-29 DIAGNOSIS — E039 Hypothyroidism, unspecified: Secondary | ICD-10-CM | POA: Diagnosis not present

## 2022-03-29 DIAGNOSIS — R748 Abnormal levels of other serum enzymes: Secondary | ICD-10-CM

## 2022-03-29 DIAGNOSIS — R627 Adult failure to thrive: Secondary | ICD-10-CM | POA: Diagnosis not present

## 2022-03-29 NOTE — Progress Notes (Signed)
Location:  [place] Room Number: 145-W Place of Service:  SNF (31)   CODE STATUS: DNR  Allergies  Allergen Reactions   Cephalexin Other (See Comments)    Pt. Doesn't remember.    Cephalexin Other (See Comments)    Pt. Doesn't remember.    Penicillins Other (See Comments)    Pt. Cant remember.     Chief Complaint  Patient presents with   Medical Management of Chronic Issues                               Elevated liver enzymes: Failure to thrive in adult: . Acquired hypothyroidism:     HPI:  She is a 88 year old long term resident of this facility being seen for the management of her chronic illnesses: Elevated liver enzymes: Failure to thrive in adult: . Acquired hypothyroidism. There are no reports of uncontrolled pain.  Her weight is stable. She does get out of bed daily   Past Medical History:  Diagnosis Date   Hypokalemia 06/11/2019   Thyroid disease     Past Surgical History:  Procedure Laterality Date   ABDOMINAL HYSTERECTOMY     TONSILLECTOMY     TOTAL HIP ARTHROPLASTY     left    Social History   Socioeconomic History   Marital status: Widowed    Spouse name: Not on file   Number of children: Not on file   Years of education: Not on file   Highest education level: Not on file  Occupational History   Occupation: retired   Tobacco Use   Smoking status: Never   Smokeless tobacco: Never  Vaping Use   Vaping Use: Never used  Substance and Sexual Activity   Alcohol use: No   Drug use: No   Sexual activity: Not Currently  Other Topics Concern   Not on file  Social History Narrative   Long term resident of [facility] .   Social Determinants of Health   Financial Resource Strain: Not on file  Food Insecurity: Not on file  Transportation Needs: Not on file  Physical Activity: Not on file  Stress: Not on file  Social Connections: Not on file  Intimate Partner Violence: Not on file   Family History  Problem Relation Age of  Onset   Heart attack Mother    Heart attack Father    Hypertension Father       VITAL SIGNS BP 130/80   Pulse 72   Ht _0  (1.549 m)   Wt 115 lb 6.4 oz (52.3 kg)   BMI 21.80 kg/m   Outpatient Encounter Medications as of 03/29/2022  Medication Sig   acetaminophen (TYLENOL) 650 MG CR tablet Take 650 mg by mouth every 8 (eight) hours as needed.   busPIRone (BUSPAR) 5 MG tablet Take 5 mg by mouth at bedtime.   calcium carbonate (OSCAL) 1500 (600 Ca) MG TABS tablet Take 1,500 mg by mouth once.   camphor-menthol (SARNA) lotion Apply 1 Application topically as needed for itching.   cholecalciferol (VITAMIN D3) 25 MCG (1000 UNIT) tablet Take 1,000 Units by mouth daily.   dextromethorphan-guaiFENesin (ROBITUSSIN-DM) 10-100 MG/5ML liquid Take 10 mLs by mouth every 6 (six) hours as needed for cough.   diphenhydrAMINE (BENADRYL) 25 mg capsule Take 25 mg by mouth every 8 (eight) hours as needed.   Ensure Max Protein (ENSURE MAX PROTEIN) LIQD Take 11 oz by mouth 3 (  three) times daily with meals. 8 am, 12 pm, and 6 pm   hydrocortisone cream (PREPARATION H) 1 % Apply 1 Application topically daily as needed for itching.   levothyroxine (SYNTHROID) 100 MCG tablet Take 100 mcg by mouth daily.   loratadine (CLARITIN) 10 MG tablet Take 10 mg by mouth daily. Chronic Allergies   NON FORMULARY Regular diet   oxyCODONE (OXY IR/ROXICODONE) 5 MG immediate release tablet Take 1 tablet (5 mg total) by mouth every 8 (eight) hours.   predniSONE (DELTASONE) 10 MG tablet Take 10 mg by mouth daily with breakfast.   SANTYL 250 UNIT/GM ointment Apply 250 g topically.   senna-docusate (SENOKOT-S) 8.6-50 MG tablet Take 1 tablet by mouth 2 (two) times daily.   witch hazel-glycerin (TUCKS) pad Apply 1 application topically 3 (three) times daily as needed for itching. Special Instructions: Apply Tucks pad to hemorrhoids   No facility-administered encounter medications on file as of 03/29/2022.     SIGNIFICANT  DIAGNOSTIC EXAMS  NO NEW EXAMS.    LABS REVIEWED  PREVIOUS    08-26-21: wbc 7.3; hgb 12.0; hct 37.5; mcv 93.5 plt 206; glucose 75; bun 57; creat 1.64; k+ 3.8; na++ 138; ca 8.2; gfr 28; protein 5.1; albumin 2.1; alt 58; alk phos 316 tsh 4.310  NO NEW LABS.   Review of Systems  Unable to perform ROS: Dementia   Physical Exam Constitutional:      General: She is not in acute distress.    Appearance: She is well-developed. She is not diaphoretic.  Neck:     Thyroid: No thyromegaly.  Cardiovascular:     Rate and Rhythm: Normal rate and regular rhythm.     Pulses: Normal pulses.     Heart sounds: Normal heart sounds.  Pulmonary:     Effort: Pulmonary effort is normal. No respiratory distress.     Breath sounds: Normal breath sounds.  Abdominal:     General: Bowel sounds are normal. There is no distension.     Palpations: Abdomen is soft.     Tenderness: There is no abdominal tenderness.  Musculoskeletal:        General: Normal range of motion.     Cervical back: Neck supple.     Right lower leg: No edema.     Left lower leg: No edema.  Lymphadenopathy:     Cervical: No cervical adenopathy.  Skin:    General: Skin is warm and dry.  Neurological:     Mental Status: She is alert. Mental status is at baseline.  Psychiatric:        Mood and Affect: Mood normal.        ASSESSMENT/ PLAN:  TODAY  Elevated liver enzymes: is chronic ast 76; alt 113; alk phos 598  2. Failure to thrive in adult: weight is 115 pounds; albumin 2.1; did not tolerate remeron; will continue supplements as directed  3. Acquired hypothyroidism: tsh 1.874 will continue synthroid 100 mcg daily   PREVIOUS   4. Chronic constipation: senna s twice daily  5. CKD stage 3b: bun 57; creat 1.64; gfr 28  6. Non-seasonal chronic allergic rhinitis: will continue claritin 10 mg  daily   7. Urinary incontinence is off myrbetriq; as this medication was not providing any benefit   8. Pemphigoid: on long term  prednisone 10 mg daily   9. Severe vascular dementia without behavioral disturbance; psychotic disturbance; mood disturbance; anxiety: weight is 115 pounds; is on buspar 5 mg daily to manage her anxiety  10. Chronic generalized pain: will continue oxycodone 5 mg every 8 hours     Ok Edwards NP [facility]  call [phone]

## 2022-03-30 ENCOUNTER — Other Ambulatory Visit: Payer: Self-pay | Admitting: Adult Health

## 2022-03-30 MED ORDER — OXYCODONE HCL 5 MG PO TABS: 5.0000 mg | ORAL_TABLET | Freq: Three times a day (TID) | ORAL | 0 refills | Status: AC

## 2022-05-12 DEATH — deceased
